# Patient Record
Sex: Male | Born: 1950
Health system: Southern US, Community
[De-identification: ages and names within clinical notes are randomized; demographics above are authoritative.]

## PROBLEM LIST (undated history)

## (undated) DIAGNOSIS — T7840XA Allergy, unspecified, initial encounter: Secondary | ICD-10-CM

## (undated) DIAGNOSIS — I421 Obstructive hypertrophic cardiomyopathy: Secondary | ICD-10-CM

## (undated) DIAGNOSIS — K5790 Diverticulosis of intestine, part unspecified, without perforation or abscess without bleeding: Secondary | ICD-10-CM

## (undated) DIAGNOSIS — K644 Residual hemorrhoidal skin tags: Secondary | ICD-10-CM

## (undated) DIAGNOSIS — J309 Allergic rhinitis, unspecified: Secondary | ICD-10-CM

## (undated) DIAGNOSIS — I10 Essential (primary) hypertension: Secondary | ICD-10-CM

## (undated) DIAGNOSIS — I517 Cardiomegaly: Secondary | ICD-10-CM

## (undated) DIAGNOSIS — K635 Polyp of colon: Secondary | ICD-10-CM

## (undated) DIAGNOSIS — I499 Cardiac arrhythmia, unspecified: Secondary | ICD-10-CM

## (undated) HISTORY — DX: Polyp of colon: K63.5

## (undated) HISTORY — DX: Allergic rhinitis, unspecified: J30.9

## (undated) HISTORY — DX: Cardiac arrhythmia, unspecified: I49.9

## (undated) HISTORY — DX: Obstructive hypertrophic cardiomyopathy: I42.1

## (undated) HISTORY — DX: Essential (primary) hypertension: I10

## (undated) HISTORY — DX: Residual hemorrhoidal skin tags: K64.4

## (undated) HISTORY — PX: TONSILLECTOMY: SHX5217

## (undated) HISTORY — DX: Allergy, unspecified, initial encounter: T78.40XA

## (undated) HISTORY — PX: COLONOSCOPY: SHX174

## (undated) HISTORY — DX: Cardiomegaly: I51.7

## (undated) HISTORY — DX: Diverticulosis of intestine, part unspecified, without perforation or abscess without bleeding: K57.90

---

## 2006-01-25 ENCOUNTER — Ambulatory Visit: Payer: Self-pay | Admitting: Internal Medicine

## 2006-01-25 LAB — CONVERTED CEMR LAB
AST: 16 units/L (ref 0–37)
BUN: 15 mg/dL (ref 6–23)
Calcium: 9.7 mg/dL (ref 8.4–10.5)
Chloride: 105 meq/L (ref 96–112)
Creatinine, Ser: 1.1 mg/dL (ref 0.4–1.5)
Glomerular Filtration Rate, Af Am: 90 mL/min/{1.73_m2}
Glucose, Bld: 85 mg/dL (ref 70–99)
HCT: 47.3 % (ref 39.0–52.0)
Potassium: 4.2 meq/L (ref 3.5–5.1)
RDW: 12.4 % (ref 11.5–14.6)
Total Bilirubin: 0.8 mg/dL (ref 0.3–1.2)
Total Protein: 6.8 g/dL (ref 6.0–8.3)
WBC: 7.9 10*3/uL (ref 4.5–10.5)

## 2006-01-27 ENCOUNTER — Ambulatory Visit: Payer: Self-pay | Admitting: Gastroenterology

## 2006-06-28 ENCOUNTER — Ambulatory Visit: Payer: Self-pay | Admitting: Internal Medicine

## 2006-07-06 ENCOUNTER — Encounter: Payer: Self-pay | Admitting: Internal Medicine

## 2006-07-06 ENCOUNTER — Ambulatory Visit: Payer: Self-pay

## 2006-07-11 ENCOUNTER — Emergency Department (HOSPITAL_COMMUNITY): Admission: EM | Admit: 2006-07-11 | Discharge: 2006-07-11 | Payer: Self-pay | Admitting: Emergency Medicine

## 2006-07-21 ENCOUNTER — Ambulatory Visit: Payer: Self-pay | Admitting: Cardiology

## 2006-07-21 ENCOUNTER — Encounter: Payer: Self-pay | Admitting: Internal Medicine

## 2006-08-02 ENCOUNTER — Ambulatory Visit: Payer: Self-pay

## 2006-08-02 ENCOUNTER — Encounter: Payer: Self-pay | Admitting: Internal Medicine

## 2006-08-12 ENCOUNTER — Telehealth: Payer: Self-pay | Admitting: Internal Medicine

## 2006-09-20 ENCOUNTER — Ambulatory Visit: Payer: Self-pay | Admitting: Cardiology

## 2006-11-29 ENCOUNTER — Encounter: Payer: Self-pay | Admitting: Internal Medicine

## 2006-12-07 ENCOUNTER — Encounter: Payer: Self-pay | Admitting: Internal Medicine

## 2006-12-20 ENCOUNTER — Telehealth (INDEPENDENT_AMBULATORY_CARE_PROVIDER_SITE_OTHER): Payer: Self-pay | Admitting: *Deleted

## 2006-12-24 ENCOUNTER — Ambulatory Visit: Payer: Self-pay | Admitting: Cardiology

## 2006-12-28 ENCOUNTER — Ambulatory Visit: Payer: Self-pay | Admitting: Cardiology

## 2006-12-28 ENCOUNTER — Encounter: Payer: Self-pay | Admitting: Internal Medicine

## 2007-05-05 ENCOUNTER — Encounter: Payer: Self-pay | Admitting: Internal Medicine

## 2007-08-19 ENCOUNTER — Ambulatory Visit: Payer: Self-pay | Admitting: Cardiology

## 2007-09-26 ENCOUNTER — Ambulatory Visit: Payer: Self-pay | Admitting: Cardiology

## 2007-09-26 ENCOUNTER — Encounter: Payer: Self-pay | Admitting: Cardiology

## 2008-03-09 ENCOUNTER — Ambulatory Visit: Payer: Self-pay | Admitting: Cardiology

## 2008-03-11 IMAGING — CT CT ABDOMEN WO/W CM
4 of 11 series · 14 of 46 positions shown, 20 images · IV contrast (Omnipaque 300)
Comparison: none

CLINICAL DATA: Family history of pancreatic CA. 
ABDOMEN CT WITHOUT AND WITH CONTRAST:
TECHNIQUE: Multidetector CT imaging of the abdomen was performed both before and during bolus administration of intravenous contrast.
Contrast:  125 cc Omnipaque 300.

[Series 2: non_contrast 5.0 b30f st · axial · 0.90mm/px · z∈[-196,-46]mm · 4 of 52 slices shown]
[im 11/52  soft-tissue]
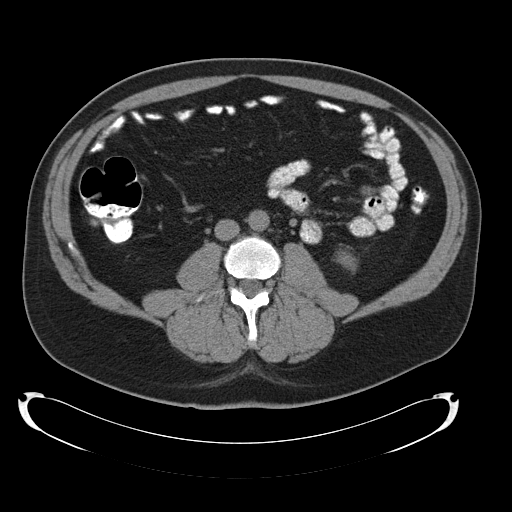
[im 21/52  soft-tissue]
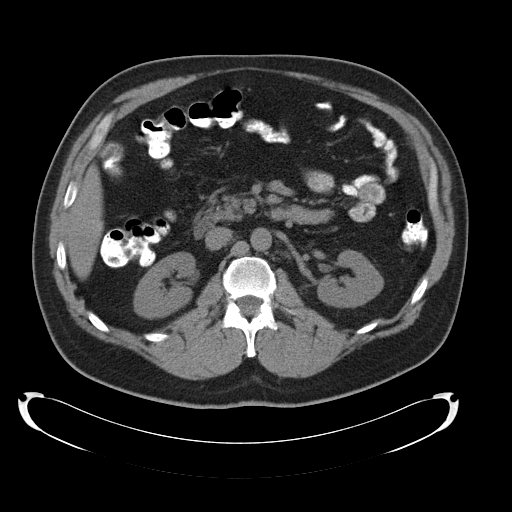
[im 31/52  soft-tissue]
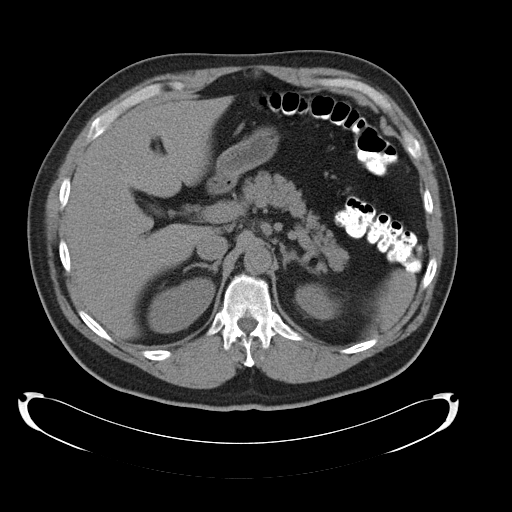
[im 41/52  soft-tissue]
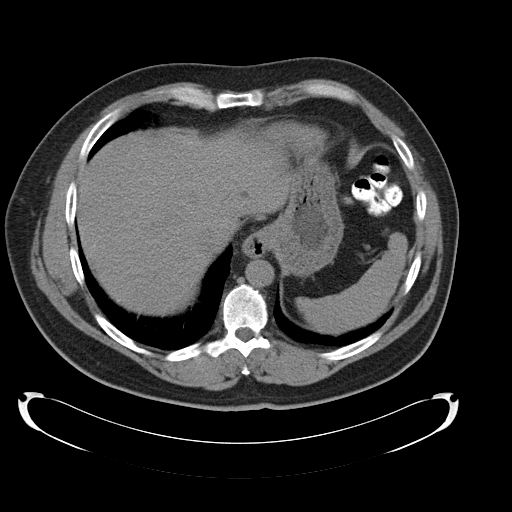

[Series 5: arterial 3.0 b30f st · axial · arterial · 0.91mm/px · z∈[-186,-160]mm · 2 of 55 slices shown]
[im 10/55  soft-tissue]
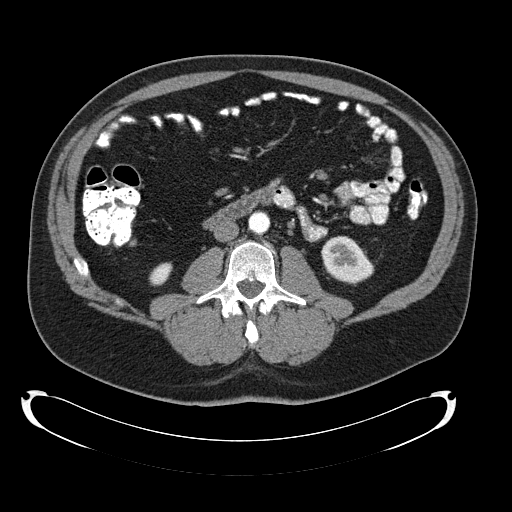
[im 19/55  soft-tissue]
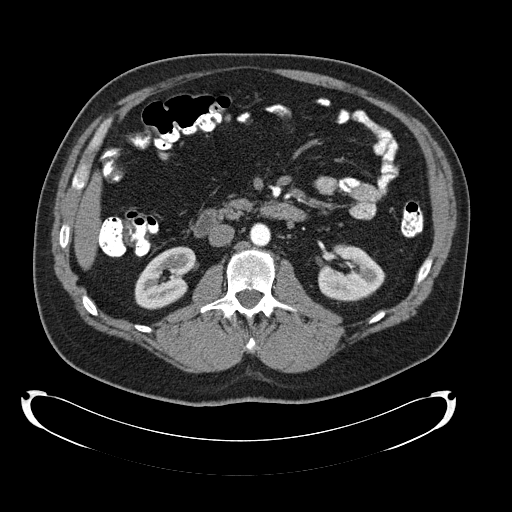

[Series 6: portal_venous 5.0 b30f · axial · 0.91mm/px · z∈[-210,-34]mm · 5 of 53 slices shown, 10 images]
[im 9/53  soft-tissue]
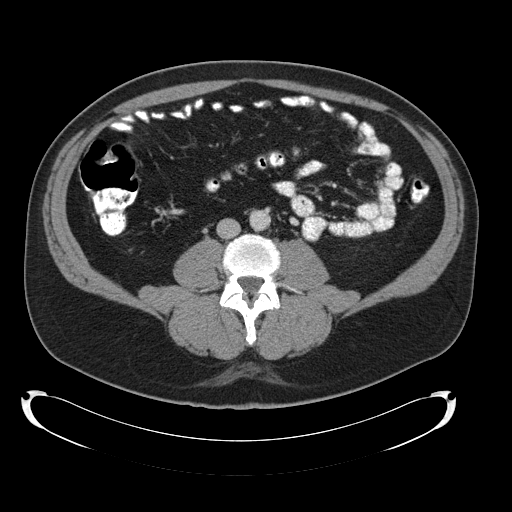
[im 9/53  bone]
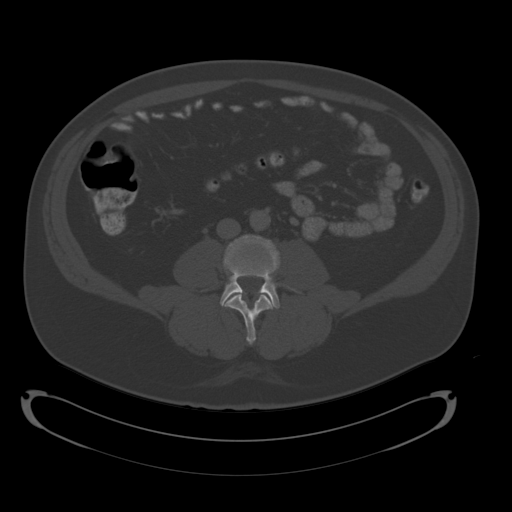
[im 18/53  soft-tissue]
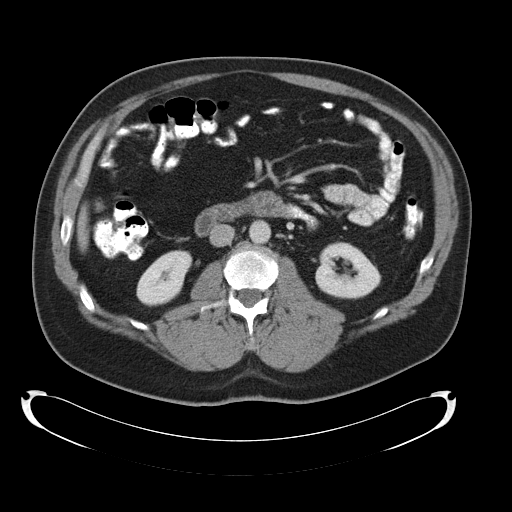
[im 18/53  lung]
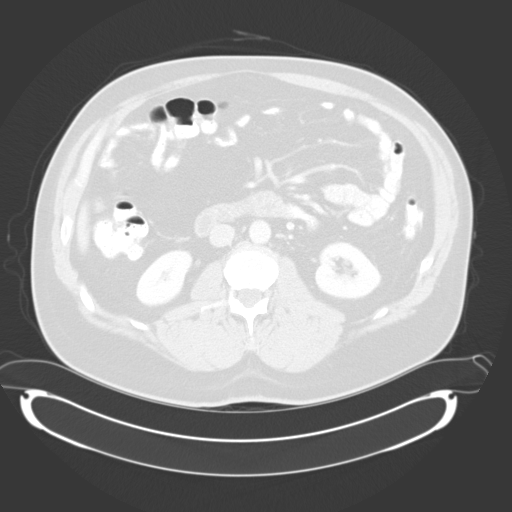
[im 27/53  soft-tissue]
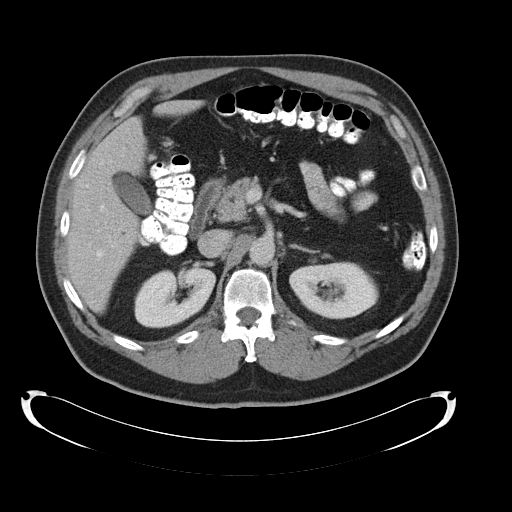
[im 27/53  lung]
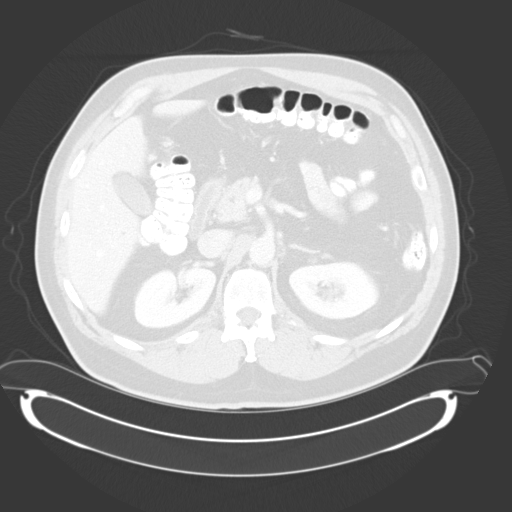
[im 35/53  soft-tissue]
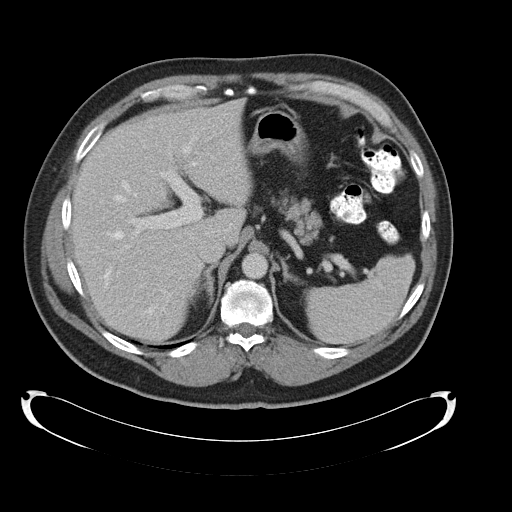
[im 35/53  lung]
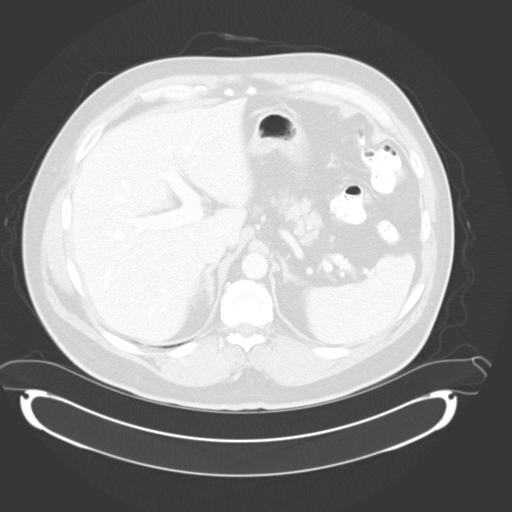
[im 44/53  soft-tissue]
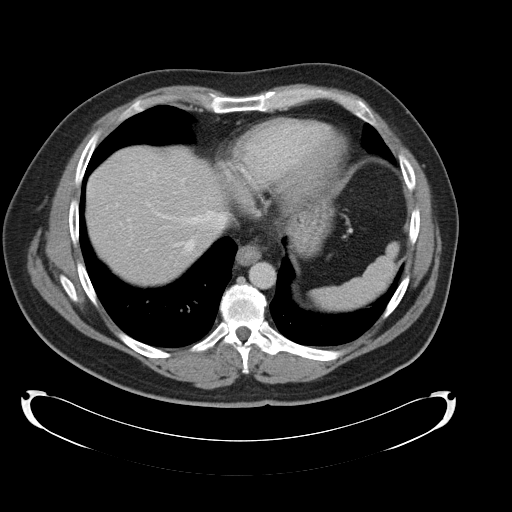
[im 44/53  lung]
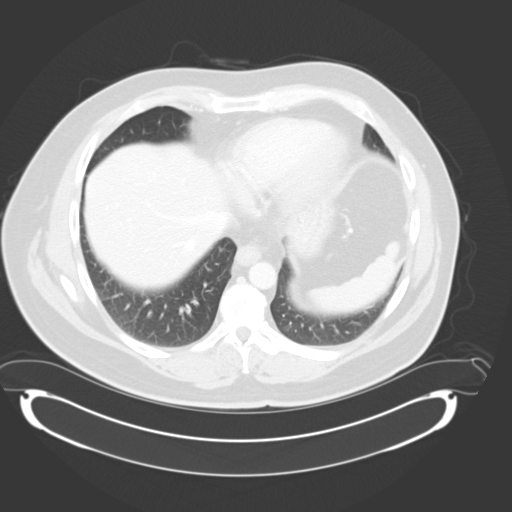

[Series 602: <mpr thick range> · coronal · 0.91mm/px · 3 of 94 slices shown, 4 images]
[im 24/94  soft-tissue]
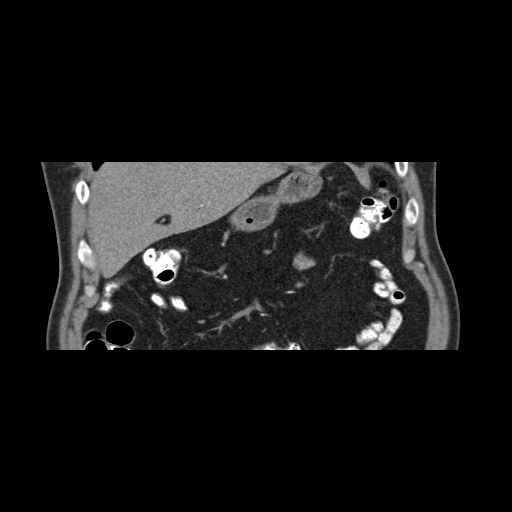
[im 47/94  soft-tissue]
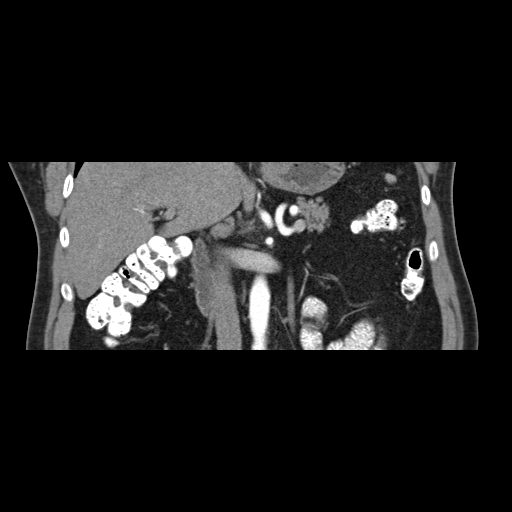
[im 47/94  bone]
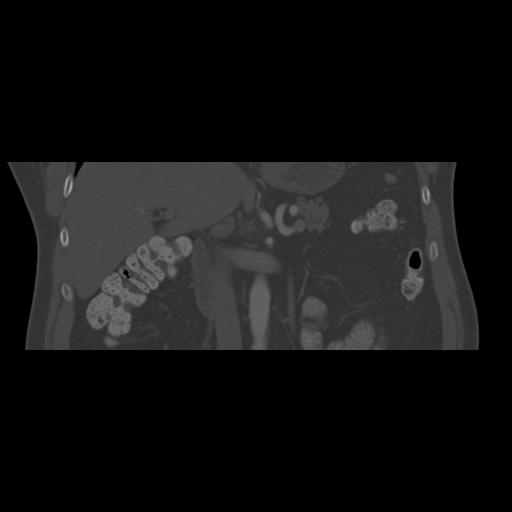
[im 70/94  soft-tissue]
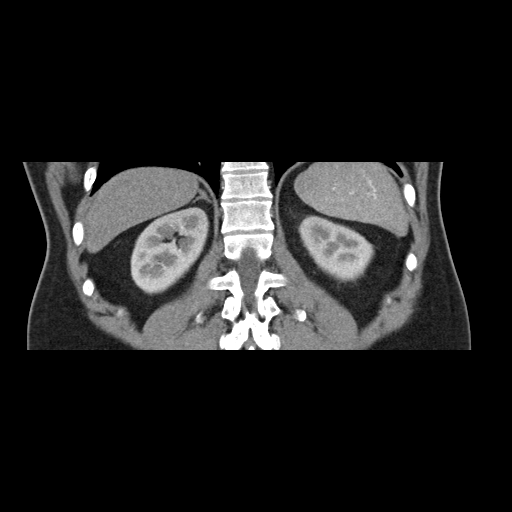

[14 of 46 positions shown; findings below may reference images not displayed]

FINDINGS: There is a very small cyst in the superior aspect of the left hepatic lobe. The pancreas is normal. No mass, abnormal contrast enhancement, or ductal dilatation. No peripancreatic abnormality. No adenopathy. Unremarkable spleen, kidneys, and adrenal glands.
IMPRESSION: Unremarkable CT scan of the abdomen with special attention to the pancreas.

## 2008-06-27 ENCOUNTER — Encounter (INDEPENDENT_AMBULATORY_CARE_PROVIDER_SITE_OTHER): Payer: Self-pay | Admitting: *Deleted

## 2008-09-22 DIAGNOSIS — R0602 Shortness of breath: Secondary | ICD-10-CM

## 2008-09-22 DIAGNOSIS — I517 Cardiomegaly: Secondary | ICD-10-CM | POA: Insufficient documentation

## 2008-09-22 DIAGNOSIS — I10 Essential (primary) hypertension: Secondary | ICD-10-CM

## 2008-09-22 HISTORY — DX: Essential (primary) hypertension: I10

## 2008-09-22 HISTORY — DX: Cardiomegaly: I51.7

## 2008-09-24 ENCOUNTER — Ambulatory Visit: Payer: Self-pay | Admitting: Cardiology

## 2008-09-24 DIAGNOSIS — I421 Obstructive hypertrophic cardiomyopathy: Secondary | ICD-10-CM

## 2008-09-24 HISTORY — DX: Obstructive hypertrophic cardiomyopathy: I42.1

## 2008-10-09 ENCOUNTER — Telehealth: Payer: Self-pay | Admitting: Cardiology

## 2008-12-24 ENCOUNTER — Ambulatory Visit: Payer: Self-pay | Admitting: Internal Medicine

## 2008-12-24 DIAGNOSIS — J309 Allergic rhinitis, unspecified: Secondary | ICD-10-CM | POA: Insufficient documentation

## 2008-12-24 HISTORY — DX: Allergic rhinitis, unspecified: J30.9

## 2009-01-17 ENCOUNTER — Ambulatory Visit: Payer: Self-pay | Admitting: Internal Medicine

## 2009-01-17 LAB — CONVERTED CEMR LAB
Eosinophils Relative: 2.2 % (ref 0.0–5.0)
HCT: 46.3 % (ref 39.0–52.0)
Lymphs Abs: 2.9 10*3/uL (ref 0.7–4.0)
MCHC: 32.6 g/dL (ref 30.0–36.0)
MCV: 89.5 fL (ref 78.0–100.0)
Monocytes Relative: 9.8 % (ref 3.0–12.0)
RBC: 5.18 M/uL (ref 4.22–5.81)
RDW: 12.4 % (ref 11.5–14.6)

## 2009-01-21 ENCOUNTER — Encounter: Payer: Self-pay | Admitting: Internal Medicine

## 2009-01-23 ENCOUNTER — Telehealth (INDEPENDENT_AMBULATORY_CARE_PROVIDER_SITE_OTHER): Payer: Self-pay | Admitting: *Deleted

## 2009-01-30 ENCOUNTER — Telehealth (INDEPENDENT_AMBULATORY_CARE_PROVIDER_SITE_OTHER): Payer: Self-pay | Admitting: *Deleted

## 2009-02-01 LAB — CONVERTED CEMR LAB: IgE (Immunoglobulin E), Serum: <1.5 intl units/mL (ref 0.0–180.0)

## 2009-03-20 ENCOUNTER — Ambulatory Visit: Payer: Self-pay | Admitting: Internal Medicine

## 2009-03-27 ENCOUNTER — Ambulatory Visit: Payer: Self-pay | Admitting: Cardiology

## 2009-04-10 ENCOUNTER — Encounter: Payer: Self-pay | Admitting: Cardiology

## 2009-04-10 ENCOUNTER — Ambulatory Visit: Payer: Self-pay

## 2009-04-10 ENCOUNTER — Ambulatory Visit: Payer: Self-pay | Admitting: Internal Medicine

## 2009-04-10 ENCOUNTER — Ambulatory Visit (HOSPITAL_COMMUNITY): Admission: RE | Admit: 2009-04-10 | Discharge: 2009-04-10 | Payer: Self-pay | Admitting: Cardiology

## 2009-04-19 ENCOUNTER — Ambulatory Visit: Payer: Self-pay | Admitting: Cardiology

## 2009-04-30 ENCOUNTER — Encounter: Payer: Self-pay | Admitting: Cardiology

## 2009-04-30 ENCOUNTER — Telehealth (INDEPENDENT_AMBULATORY_CARE_PROVIDER_SITE_OTHER): Payer: Self-pay | Admitting: *Deleted

## 2009-06-04 ENCOUNTER — Ambulatory Visit: Payer: Self-pay

## 2009-06-04 ENCOUNTER — Ambulatory Visit: Payer: Self-pay | Admitting: Cardiology

## 2010-02-11 NOTE — Assessment & Plan Note (Signed)
Summary: George [name]   Copy to:  pcp Primary Provider/Referring Provider:  Dr. Eleonore Chiquito  CC:  follow up visit.  History of Present Illness: January 17, 2009- 60 yoM seen courtesy of Dr Frederica Kuster for concern of severe nasal congestion.  He has had episodic rhinitis as an adult and reports allergy testing years ago which he said produced delayed reactions next day. He was told he was allergic to mold, mildew and pet danders. His last significant flare was in 2008. 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 Since he does get better at times, he suspects something related to the weather causing the HVAC system to operate. He reports that other workers in the building have similar problems. he gets better as he drives away from the building  He gets nasal blockage such that he has to sleep upright in a chair. Sudafed-PE and Zyrtec give only temporary help. Saline squeeze bottle helps. Amoxacillin for a root canal helped once before, but he denies fever, purulent discharge, headache, cough or wheeze. He lives with 2 cats on a long term basis and denies they are a problem. ENT surgery limitied to tonsils.  March 27, 2009-  Rhinitis Neb and depomedrol helped a lot for several days after last here. 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 Today he is doing better than in February, but he still notices stuffiness with little nasal discharge. He put an airfilter in his work area. He refers again to "year around allergy to mold and mildew".   Current Medications (verified): 1)  Flonase 50 Mcg/act  Susp (Fluticasone Propionate) .... I Spray Ea Nostril Once Daily 2)  Metoprolol Succinate 50 Mg Xr24h-Tab (Metoprolol Succinate) .... Take One Tablet By Mouth Daily 3)  Zyrtec Allergy 10 Mg Tabs  (Cetirizine Hcl) .... As Needed 4)  Multivitamins   Tabs (Multiple Vitamin) .Marland Kitchen.. 1 Tab By Mouth Once Daily 5)  Omega 3 340 Mg Cpdr (Omega-3 Fatty Acids) .Marland Kitchen.. 1 Tab By Mouth Once Daily 6)  Singulair 10 Mg Tabs (Montelukast Sodium) .... Take 1 By Mouth Once Daily 7)  Resveratrol 100 Mg Caps (Resveratrol) .... Take 340mg  By Mouth Once Daily  Allergies (verified): 1)  ! Sulfa  Comments:  Nurse/Medical Assistant: The patient's medications and allergies were reviewed with the patient and were updated in the Medication and Allergy Lists. Boone Master CNA  March 20, 2009 9:47 AM   Past History:  Past Medical History: Last updated: 01/17/2009 DYSPNEA (ICD-786.05) HYPERTENSION (ICD-401.9) VENTRICULAR HYPERTROPHY, LEFT (ICD-429.3)    [facility] FAMILY HISTORY OF OTHER MALIGNANT NEOPLASM (ICD-V16.49) perennial rhinitis  Past Surgical History: Last updated: 09/24/2008 Tonsillectomy  Family History: Last updated: 01/17/2009  Is negative for coronary artery disease or sudden   death. There is no history of cardiomyopathy by his report.   Pancreatic Cancer-father  Social History: Last updated: 01/17/2009   He has a remote history of tobacco use, but has not   smoked since the 1980s. He occasionally consumes a glass of wine.  Grew up in [place]   Marital Status: Married, lives with wife Children: no Occupation: Building surveyor, Research scientist (medical) for [employer]  Review of Systems      See HPI  The patient denies anorexia, fever, weight loss, weight gain, vision loss,  decreased hearing, hoarseness, chest pain, syncope, dyspnea on exertion, peripheral edema, prolonged cough, headaches, hemoptysis, abdominal pain, and severe indigestion/heartburn.    Vital Signs:  Patient profile:   60 year old male Height:      70 inches Weight:      237 pounds BMI:     34.13 O2 Sat:      96 % on Room air Pulse rate:   65 / minute BP sitting:   118 / 76  (left arm) Cuff size:   regular  Vitals  Entered By: Boone Master CNA (March 20, 2009 9:44 AM)  O2 Flow:  Room air CC: follow up visit   Physical Exam  Additional Exam:  General: A/Ox3; pleasant and cooperative, NAD, SKIN: no rash, lesions NODES: no lymphadenopathy HEENT: [place]/AT, EOM- WNL, Conjuctivae- clear, PERRLA, mild periorbital edema, TM-WNL, Nose- nasal mucosa grainy/ red with white mucus, no polyps, Throat- clear and wnl, Mellampatti  III NECK: Supple w/ fair ROM, JVD- none, normal carotid impulses w/o bruits Thyroid- normal to palpation CHEST: Clear to P&A HEART: RRR, 2-3/6 systolic murmur left upper sternum ABDOMEN: Soft and nl; nml bowel sounds;  ZOX:WRUE, nl pulses, no edema  NEURO: Grossly intact to observation      Impression & Recommendations:  Problem # 1:  VASOMOTOR RHINITIS (ICD-477.9)  I question how much of his problem is an irritant response vs allergic. Drafts and probably mold exposure are important by his history. He is considering leaving this job where the environment is significant. Question IgG response. Since he thinks singulair is helpful, he will continue that for now. We discussed and will also script ipratropium nasal spray for trial. nasalcrom is an option. His updated medication list for this problem includes:    Flonase 50 Mcg/act Susp (Fluticasone propionate) ..... I spray ea nostril once daily    Zyrtec Allergy 10 Mg Tabs (Cetirizine hcl) .Marland Kitchen... As needed  Orders: Est. Patient Level III (45409)  Medications Added to Medication List This Visit: 1)  Resveratrol 100 Mg Caps (Resveratrol) .... Take 340mg  by mouth once daily  Patient Instructions: 1)  Please schedule a follow-up appointment as needed. 2)  Singulair can be continued daily long term if it helps. 3)  OTC Nasalcrom/ cromolyn nasal spray is a different kind of anti-inflammatory chemistry and might also help.

## 2010-02-11 NOTE — Progress Notes (Signed)
Summary: results  Phone Note Call from Patient Call back at [phone]   Caller: Patient Call For: young Summary of Call: calling for lab results Initial call taken by: Rickard Patience,  January 30, 2009 4:47 PM  Follow-up for Phone Call        Labs signed, but no interpretation. I have printed them and placed on CY look-at along with phone note. Pt aware will not be addresed until the AM. Please advise. Carron Curie CMA  January 30, 2009 5:06 PM   Additional Follow-up for Phone Call Additional follow up Details #1::        Ready. Additional Follow-up by: Waymon Budge MD,  January 30, 2009 9:37 PM    Additional Follow-up for Phone Call Additional follow up Details #2::    [email or web address].Reynaldo Minium CMA  February 01, 2009 12:37 PM

## 2010-02-11 NOTE — Progress Notes (Signed)
Summary: appt  ---- Converted from flag ---- ---- 01/18/2009 1:53 PM, Zackery Barefoot CMA wrote: please call and schedule pt one month out from yesterday with Dr. Maple Hudson. everyone was gone by the time he was done.... ------------------------------  Phone Note Call from Patient   Summary of Call: pt schedulled for 02/26/2009 to see CDY Initial call taken by: Eugene Gavia,  January 30, 2009 4:51 PM

## 2010-02-11 NOTE — Assessment & Plan Note (Signed)
Summary: f31m/dm  Medications Added METOPROLOL SUCCINATE 50 MG XR24H-TAB (METOPROLOL SUCCINATE) Take one tablet by mouth daily        Referring Provider:  pcp Primary Provider:  Dr. Eleonore Chiquito   History of Present Illness: Mr. Latorre is a pleasant gentleman who has a history of left ventricular hypertrophy with systolic anterior motion of the mitral valve and Doppler evidence of a dynamic left outflow obstruction with Valsalva.  Last echocardiogram was performed on September 26, 2007. There was normal LV function, chordal SAM causing a narrow LVOT and mild mitral regurgitation. There is mild left atrial enlargement. A previous Myoview in July 2008 showed no ischemia or infarction.  Also note that he exercised for 7 minutes and 16 seconds, and there was normal blood pressure response. I last saw him in Sept 2010. Since then the patient has dyspnea with more extreme activities but  not with routine activities. It is relieved with rest. It is not associated with chest pain. There is no orthopnea, PND. There is no syncope or palpitations. There is no exertional chest pain. There is occasional mild pedal edema towards the end of the day it improves overnight.   Current Medications (verified): 1)  Flonase 50 Mcg/act  Susp (Fluticasone Propionate) .... I Spray Ea Nostril Once Daily 2)  Metoprolol Succinate 50 Mg Xr24h-Tab (Metoprolol Succinate) .... Take One Tablet By Mouth Daily 3)  Zyrtec Allergy 10 Mg Tabs (Cetirizine Hcl) .... As Needed 4)  Multivitamins   Tabs (Multiple Vitamin) .Marland Kitchen.. 1 Tab By Mouth Once Daily 5)  Omega 3 340 Mg Cpdr (Omega-3 Fatty Acids) .Marland Kitchen.. 1 Tab By Mouth Once Daily 6)  Singulair 10 Mg Tabs (Montelukast Sodium) .... Take 1 By Mouth Once Daily 7)  Resveratrol 100 Mg Caps (Resveratrol) .... Take 340mg  By Mouth Once Daily  Allergies: 1)  ! Sulfa  Past History:  Past Medical History: Reviewed history from 01/17/2009 and no changes required. DYSPNEA  (ICD-786.05) HYPERTENSION (ICD-401.9) VENTRICULAR HYPERTROPHY, LEFT (ICD-429.3)    [facility] FAMILY HISTORY OF OTHER MALIGNANT NEOPLASM (ICD-V16.49) perennial rhinitis  Past Surgical History: Reviewed history from 09/24/2008 and no changes required. Tonsillectomy  Social History: Reviewed history from 01/17/2009 and no changes required.   He has a remote history of tobacco use, but has not   smoked since the 1980s. He occasionally consumes a glass of wine.  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 Remaining systems are negative.   Vital Signs:  Patient profile:   60 year old male Height:      70 inches Weight:      231 pounds BMI:     33.26 Pulse rate:   51 / minute Resp:     12 per minute BP sitting:   115 / 70  (left arm)  Vitals Entered By: Kem Parkinson (March 27, 2009 8:24 AM)  Physical Exam  General:  Well-developed well-nourished in no acute distress.  Skin is warm and dry.  HEENT is normal.  Neck is supple. No thyromegaly.  Chest is clear to auscultation with normal expansion.  Cardiovascular exam is regular rate and rhythm. 2/6 systolic murmur at the left sternal border that increases with Valsalva. Abdominal exam nontender or distended. No masses  palpated. Extremities show no edema. neuro grossly intact    EKG  Procedure date:  03/27/2009  Findings:      Sinus bradycardia at a rate of 51. Cannot rule out prior septal infarct.  Impression & Recommendations:  Problem # 1:  CARDIOMYOPATHY, HYPERTROPHIC, OBSTRUCTIVE (ICD-425.1) Patient has a history of systolic anterior motion of the mitral valve with narrowed left ventricular outflow  tract. He has hypertrophic obstructive physiology. I will continue with his beta blocker. He has had significant increase in symptoms off of this previously. He did not tolerate Cardizem or verapamil. Repeat echocardiogram. His updated medication list for this problem includes:    Metoprolol Succinate 50 Mg Xr24h-tab (Metoprolol succinate) .Marland Kitchen... Take one tablet by mouth daily  Problem # 2:  HYPERTENSION (ICD-401.9) Blood pressure controlled on present medications. Will continue. His updated medication list for this problem includes:    Metoprolol Succinate 50 Mg Xr24h-tab (Metoprolol succinate) .Marland Kitchen... Take one tablet by mouth daily  Problem # 3:  DYSPNEA (ICD-786.05) Most likely related to hypertrophic obstructive physiology. His updated medication list for this problem includes:    Metoprolol Succinate 50 Mg Xr24h-tab (Metoprolol succinate) .Marland Kitchen... Take one tablet by mouth daily  Other Orders: Echocardiogram (Echo)  Patient Instructions: 1)  Your physician recommends that you schedule a follow-up appointment in: ONE YEAR 2)  Your physician has requested that you have an echocardiogram.  Echocardiography is a painless test that uses sound waves to create images of your heart. It provides your doctor with information about the size and shape of your heart and how well your heart's chambers and valves are working.  This procedure takes approximately one hour. There are no restrictions for this procedure. Prescriptions: METOPROLOL SUCCINATE 50 MG XR24H-TAB (METOPROLOL SUCCINATE) Take one tablet by mouth daily  #30 x 12   Entered by:   Deliah Goody, RN   Authorized by:   Ferman Hamming, MD, [facility]   Signed by:   Deliah Goody, RN on 03/27/2009   Method used:   Electronically to        [facility] (retail)       [address]       [place], [place]  [address]       Ph: [phone]       Fax: [phone]   RxID:   [id number]

## 2010-02-11 NOTE — Progress Notes (Signed)
   Phone Note Outgoing Call   Call placed by: Deliah Goody, RN,  April 30, 2009 12:13 PM Summary of Call: pt made aware that holter monitor reviewed by dr [name] shows sinus with rare PVC,no NSVT. Deliah Goody, RN  April 30, 2009 12:14 PM

## 2010-02-11 NOTE — Assessment & Plan Note (Signed)
Summary: allergy problem/ mbw   Vital Signs:  Patient profile:   60 year old male Height:      70 inches Weight:      242 pounds O2 Sat:      97 % on Room air Pulse rate:   69 / minute BP sitting:   140 / 82  (right arm) Cuff size:   large  Vitals Entered By: Zackery Barefoot CMA (January 17, 2009 4:40 PM)  O2 Flow:  Room air CC: Pt here for allergy consult. Pt c/o re-current sinus infections Comments Medications reviewed with patient Zackery Barefoot [facility]  January 17, 2009 4:42 PM    Visit Type:  Initial Consult Copy to:  pcp Primary Provider/Referring Provider:  Dr. Eleonore Chiquito  CC:  Pt here for allergy consult. Pt c/o re-current sinus infections.  History of Present Illness: January 17, 2009- 59 yoM seen courtesy of Dr Frederica Kuster for concern of severe nasal congestion.  He has had episodic rhinitis as an adult and reports allergy testing years ago which he said produced delayed reactions next day. He was told he was allergic to mold, mildew and pet danders. His last significant flare was in 2008. 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 Since he does get better at times, he suspects something related to the weather causing the HVAC system to operate. He reports that other workers in the building have similar problems. he gets better as he drives away from the building  He gets nasal blockage such that he has to sleep upright in a chair. Sudafed-PE and Zyrtec give only temporary help. Saline squeeze bottle helps. Amoxacillin for a root canal helped once before, but he denies fever, purulent discharge, headache, cough or wheeze. He lives with 2 cats on a long term basis and denies they are a problem. ENT surgery limitied to tonsils.  Current Medications (verified): 1)  Flonase 50 Mcg/act  Susp (Fluticasone Propionate) .... I Spray Ea Nostril Once Daily 2)  Metoprolol Succinate  50 Mg Xr24h-Tab (Metoprolol Succinate) .... Take One Tablet By Mouth Daily 3)  Zyrtec Allergy 10 Mg Tabs (Cetirizine Hcl) .... As Needed 4)  Multivitamins   Tabs (Multiple Vitamin) .Marland Kitchen.. 1 Tab By Mouth Once Daily 5)  Omega 3 340 Mg Cpdr (Omega-3 Fatty Acids) .Marland Kitchen.. 1 Tab By Mouth Once Daily  Allergies (verified): 1)  ! Sulfa  Past History:  Past Surgical History: Last updated: 09/24/2008 Tonsillectomy  Family History: Last updated: 01/17/2009  Is negative for coronary artery disease or sudden   death. There is no history of cardiomyopathy by his report.   Pancreatic Cancer-father  Social History: Last updated: 01/17/2009   He has a remote history of tobacco use, but has not   smoked since the 1980s. He occasionally consumes a glass of wine.  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 There is no history of cardiomyopathy by his report.   Pancreatic Cancer-father  Social History:   He has a remote history of tobacco use, but has not   smoked since the  1980s. He occasionally consumes a glass of wine.  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  The patient denies shortness of breath with activity, shortness of breath at rest, productive cough, non-productive cough, coughing up blood, chest pain, irregular heartbeats, acid heartburn, indigestion, loss of appetite, weight change,  abdominal pain, difficulty swallowing, sore throat, tooth/dental problems, headaches, sneezing, itching, ear ache, anxiety, depression, joint stiffness or pain, rash, change in color of mucus, and fever.    Physical Exam  Additional Exam:  General: A/Ox3; pleasant and cooperative, NAD, SKIN: no rash, lesions NODES: no lymphadenopathy HEENT: [place]/AT, EOM- WNL, Conjuctivae- clear, PERRLA, mild periorbital edema, TM-WNL, Nose- nasal mucosa grainy/ red with white mucus, no polyps, Throat- clear and wnl, Mellampatti  III NECK: Supple w/ fair ROM, JVD- none, normal carotid impulses w/o bruits Thyroid- normal to palpation CHEST: Clear to P&A HEART: RRR, 2-3/6 systolic murmur left upper sternum ABDOMEN: Soft and nl; nml bowel sounds;  GUY:QIHK, nl pulses, no edema  NEURO: Grossly intact to observation      Impression & Recommendations:  Problem # 1:  VASOMOTOR RHINITIS (ICD-477.9)  Significant rhinitis- episodic. We need to see if we can identify an atopic component. He is anxious to get enough immediate control that he can get some sleep at night. We are giving nasl neb and depo. His updated medication list for this problem includes:    Flonase 50 Mcg/act Susp (Fluticasone propionate) ..... I spray ea nostril once daily    Zyrtec Allergy 10 Mg Tabs (Cetirizine hcl) .Marland Kitchen... As needed  Other Orders: Consultation Level III (74259) TLB-CBC Platelet - w/Differential (85025-CBCD) T-"RAST" (Allergy Full Profile) IGE (56387-56433) EMR miscellaneous medications (EMRORAL) Nebulizer Tx (29518) Depo- Medrol 80mg  (J1040) Admin of Therapeutic Inj  intramuscular or subcutaneous (84166)  Patient Instructions: 1)  Please schedule a follow-up appointment in 1 month. 2)  Consider environmental info/ air cleaner 3)  Try Singulair 10 mg, 1 daily, samples and script 4)  neb neo nasal 5)  depo 80 6)  lab   Not Administered:    Influenza Vaccine not given due to: declined    Medication  Administration  Injection # 1:    Medication: Depo- Medrol 80mg     Diagnosis: VASOMOTOR RHINITIS (ICD-477.9)    Route: IM    Site: RUOQ gluteus    Exp Date: 10/11    Lot #: [id number]    Mfr: [email or web address]    Patient tolerated injection without complications    Given by: Zackery Barefoot CMA (January 17, 2009 5:52 PM)  Medication # 1:    Medication: EMR miscellaneous medications    Diagnosis: VASOMOTOR RHINITIS (ICD-477.9)    Dose: 3 sprays    Route: intranasal    Exp Date: 05/12    Lot #: [id number]    Mfr: [email or web address]    Comments: Neo-Synephrine    Patient tolerated medication without complications    Given by: Zackery Barefoot CMA (January 17, 2009 5:51 PM)  Orders Added: 1)  Consultation Level III [73220] 2)  TLB-CBC Platelet - w/Differential [85025-CBCD] 3)  T-"RAST" (Allergy Full Profile) IGE [86003-82785] 4)  EMR miscellaneous medications [EMRORAL] 5)  Nebulizer Tx [25427] 6)  Depo- Medrol 80mg  [J1040] 7)  Admin of Therapeutic Inj  intramuscular  or subcutaneous [id number]

## 2010-02-11 NOTE — Procedures (Signed)
Summary: sumary report  sumary report   Imported By: Mirna Mires 04/30/2009 15:07:42  _____________________________________________________________________  External Attachment:    Type:   Image     Comment:   External Document

## 2010-03-13 ENCOUNTER — Ambulatory Visit: Payer: Self-pay | Admitting: Cardiology

## 2010-03-13 ENCOUNTER — Encounter: Payer: Self-pay | Admitting: Cardiology

## 2010-03-13 ENCOUNTER — Ambulatory Visit (INDEPENDENT_AMBULATORY_CARE_PROVIDER_SITE_OTHER): Payer: BC Managed Care – PPO | Admitting: Cardiology

## 2010-03-13 DIAGNOSIS — I421 Obstructive hypertrophic cardiomyopathy: Secondary | ICD-10-CM

## 2010-03-13 DIAGNOSIS — I10 Essential (primary) hypertension: Secondary | ICD-10-CM

## 2010-03-20 NOTE — Assessment & Plan Note (Signed)
Summary: f1y per check [email or web address]  Medications Added ZYRTEC ALLERGY 10 MG TABS (CETIRIZINE HCL) 1 tab by mouth once daily SINGULAIR 10 MG TABS (MONTELUKAST SODIUM) as needed        Referring Provider:  pcp Primary Provider:  Dr. Eleonore Chiquito  CC:  sob.  History of Present Illness: Mr. Bisping is a pleasant gentleman who has a history of left ventricular hypertrophy with systolic anterior motion of the mitral valve and Doppler evidence of a dynamic left outflow obstruction with Valsalva.  Last echocardiogram was performed in March of 2011. There was normal LV function, chordal SAM causing a narrow LVOT and elevated LVOT gradient at rest with a mean gradient of 71 mmHg. Holter monitor in April of 2011 showed no nonsustained tachycardia. A previous Myoview in July 2008 showed no ischemia or infarction.  Also note that he exercised for 7 minutes and 16 seconds, and there was normal blood pressure response. I last saw him in March of 2011. Since then he does have some dyspnea on exertion but no orthopnea, PND, palpitations, syncope or chest pain. He occasionally has mild pedal edema.   Current Medications (verified): 1)  Flonase 50 Mcg/act  Susp (Fluticasone Propionate) .... I Spray Ea Nostril Once Daily 2)  Metoprolol Succinate 50 Mg Xr24h-Tab (Metoprolol Succinate) .... Take One Tablet By Mouth Daily 3)  Zyrtec Allergy 10 Mg Tabs (Cetirizine Hcl) .Marland Kitchen.. 1 Tab By Mouth Once Daily 4)  Multivitamins   Tabs (Multiple Vitamin) .Marland Kitchen.. 1 Tab By Mouth Once Daily 5)  Omega 3 340 Mg Cpdr (Omega-3 Fatty Acids) .Marland Kitchen.. 1 Tab By Mouth Once Daily 6)  Singulair 10 Mg Tabs (Montelukast Sodium) .... As Needed 7)  Resveratrol 100 Mg Caps (Resveratrol) .... Take 340mg  By Mouth Once Daily  Allergies: 1)  ! Sulfa  Past History:  Past Medical History: HYPERTENSION (ICD-401.9) VENTRICULAR HYPERTROPHY, LEFT (ICD-429.3)    [facility] FAMILY HISTORY OF OTHER MALIGNANT NEOPLASM (ICD-V16.49) perennial  rhinitis  Past Surgical History: Reviewed history from 09/24/2008 and no changes required. Tonsillectomy  Social History: Reviewed history from 01/17/2009 and no changes required.   He has a remote history of tobacco use, but has not   smoked since the 1980s. He occasionally consumes a glass of wine.  Grew up in [place]   Marital Status: Married, lives with wife Children: no Occupation: Building surveyor, Research scientist (medical) for [employer]  Review of Systems       no fevers or chills, productive cough, hemoptysis, dysphasia, odynophagia, melena, hematochezia, dysuria, hematuria, rash, seizure activity, orthopnea, PND, pedal edema, claudication. Remaining systems are negative.   Vital Signs:  Patient profile:   60 year old male Height:      70 inches Weight:      238 pounds BMI:     34.27 Pulse rate:   54 / minute Resp:     14 per minute BP sitting:   136 / 78  (left arm)  Vitals Entered By: Kem Parkinson (March 13, 2010 8:52 AM)  Physical Exam  General:  Well-developed well-nourished in no acute distress.  Skin is warm and dry.  HEENT is normal.  Neck is supple. No thyromegaly.  Chest is clear to auscultation with normal expansion.  Cardiovascular exam is regular rate and rhythm. 3/6 systolic murmur left sternal border that increases with Valsalva. Abdominal exam nontender or distended. No masses palpated. Extremities show no edema. neuro grossly intact    EKG  Procedure date:  03/13/2010  Findings:  Sinus bradycardia, left ventricular hypertrophy.  Impression & Recommendations:  Problem # 1:  CARDIOMYOPATHY, HYPERTROPHIC, OBSTRUCTIVE (ICD-425.1) Continue beta blocker. If his symptoms worsen in the future we will consider referral to [facility]. Consideration for myectomy or other procedures. He has no risk factors for sudden death including no family history of sudden death, no syncope, no nonsustained ventricular tachycardia on Holter. Repeat  exercise treadmill to make sure systolic blood pressure increases. His updated medication list for this problem includes:    Metoprolol Succinate 50 Mg Xr24h-tab (Metoprolol succinate) .Marland Kitchen... Take one tablet by mouth daily  Problem # 2:  HYPERTENSION (ICD-401.9)  Blood pressure controlled. Continue present medications. His updated medication list for this problem includes:    Metoprolol Succinate 50 Mg Xr24h-tab (Metoprolol succinate) .Marland Kitchen... Take one tablet by mouth daily  His updated medication list for this problem includes:    Metoprolol Succinate 50 Mg Xr24h-tab (Metoprolol succinate) .Marland Kitchen... Take one tablet by mouth daily  Other Orders: Treadmill (Treadmill)  Patient Instructions: 1)  Your physician wants you to follow-up in: one year  You will receive a reminder letter in the mail two months in advance. If you don't receive a letter, please call our office to schedule the follow-up appointment. 2)  Your physician has requested that you have an exercise tolerance test.  For further information please visit [email or web address]  Please also follow instruction sheet, as given.

## 2010-03-25 ENCOUNTER — Encounter: Payer: Self-pay | Admitting: Physician Assistant

## 2010-03-25 ENCOUNTER — Encounter (INDEPENDENT_AMBULATORY_CARE_PROVIDER_SITE_OTHER): Payer: BC Managed Care – PPO | Admitting: Physician Assistant

## 2010-03-25 DIAGNOSIS — I421 Obstructive hypertrophic cardiomyopathy: Secondary | ICD-10-CM

## 2010-04-07 ENCOUNTER — Other Ambulatory Visit: Payer: Self-pay | Admitting: Cardiology

## 2010-04-09 ENCOUNTER — Telehealth: Payer: Self-pay | Admitting: Cardiology

## 2010-04-09 DIAGNOSIS — I10 Essential (primary) hypertension: Secondary | ICD-10-CM

## 2010-04-09 MED ORDER — METOPROLOL SUCCINATE ER 50 MG PO TB24: 50.0000 mg | ORAL_TABLET | Freq: Every day | ORAL | Status: DC

## 2010-04-09 NOTE — Telephone Encounter (Signed)
Refill done.  

## 2010-04-10 NOTE — Telephone Encounter (Signed)
[address] °

## 2010-04-17 ENCOUNTER — Other Ambulatory Visit: Payer: Self-pay | Admitting: Internal Medicine

## 2010-05-08 ENCOUNTER — Other Ambulatory Visit: Payer: Self-pay | Admitting: Internal Medicine

## 2010-05-27 NOTE — Assessment & Plan Note (Signed)
[facility] HEALTHCARE                            CARDIOLOGY OFFICE NOTE   NAME:George [name], George [name]                        MRN:          [id number]  DATE:12/24/2006                            DOB:          11-05-1950    George [name] is a 60 year old gentleman who has a history of left  ventricular hypertrophy, systolic anterior motion of the mitral valve  and Doppler evidence of a dynamic left ventricular outflow obstruction  on Valsalva.  A previous Myoview showed no ischemia or infarction with  preserved LV function.  Since when I last saw him we discontinued his  Toprol as it was causing some fatigue and placed him on Verapamil,  however, this caused significant constipation and he resumed the Toprol.  He now has dyspnea with more moderate activities but not with routine  activities.  There is no orthopnea, PND, pedal edema, palpitations,  presyncope, syncope or chest pain.  He continues to have some fatigue on  the Toprol and mildly reduced sex drive.   MEDICATIONS:  1. Multivitamin daily.  2. Omega 3 fish oil.  3. Toprol 25 mg p.o. daily.  4. Xyzal.   PHYSICAL EXAMINATION:  Shows a blood pressure of 121/75 and his pulse is  54.  HEENT:  Normal.  NECK:  Supple.  CHEST:  Clear.  CARDIOVASCULAR:  Reveals a regular rate and rhythm and there is a 2/6  systolic murmur at the left mid sternal border.  It increases to a 3/6  systolic murmur with Valsalva.  There is also 1/6 systolic murmur at the  apex.  ABDOMINAL:  Shows no tenderness.  EXTREMITIES:  Show no edema.   His electrocardiogram shows a sinus bradycardia at a rate of 52.  There  are no ST changes.   DIAGNOSES:  1. Dyspnea -- This appears to be secondary to his obstructive      physiology noted on his echocardiogram in June.  Verapamil caused      constipation and he has resumed his Toprol.  This is causing some      degree of fatigue and reduced sex drive, although he states it      could be  tolerable.  However, we will try Cardizem and      discontinuing his Toprol to see if this improves his symptoms.  If      not, I have asked him to resume his Toprol.  We will begin with      Cardizem CD 120 mg p.o. daily.  2. Hypertrophic obstructive physiology -- His electrocardiogram is not      consistent with hypertrophic obstructive cardiomyopathy, but his      echo does show obstructive physiology with systolic motion of the      anterior mitral valve and an left ventricular outflow tract      gradient.  3. Hypertension -- His blood pressure is adequately controlled.   I will see him back in 6 months and we will most likely repeat his  echocardiogram at that time.     George Frieze [name], MD, [facility]  Electronically Signed  BSC/MedQ  DD: 12/24/2006  DT: 12/24/2006  Job #: [id number]

## 2010-05-27 NOTE — Assessment & Plan Note (Signed)
[facility] HEALTHCARE                            CARDIOLOGY OFFICE NOTE   George, Favaro KREED [name]                        MRN:          [id number]  DATE:03/09/2008                            DOB:          1950-12-11    George [name] is a pleasant gentleman who has a history of left ventricular  hypertrophy with systolic anterior motion of the mitral valve and  Doppler evidence of a dynamic left outflow obstruction with Valsalva.  A  previous Myoview in July 2008 showed no ischemia or infarction.  Also  note that he exercised for 7 minutes and 16 seconds, and there was  normal blood pressure response.  Since I last saw him, he had dyspnea  with more extreme activities, but not with routine activities.  There is  no orthopnea or PND, chest pain, or syncope.  He occasionally feels  minimal pedal edema.  Note, he has no family history of sudden death.   His medications at present include:  1. Multivitamin.  2. Omega fish oil.  3. Zyrtec 10 mg p.o. daily.  4. Metoprolol ER 50 mg p.o. daily.  5. Resveratrol 325 mg p.o. daily.  6. L-carnitine.  7. Fluconazole.   On physical exam shows a blood pressure of 110/70 and his pulse is 60.  He weighs 238 pounds.  His HEENT is normal.  His neck is supple.  His  chest is clear.  His cardiovascular exam reveals a regular rate rhythm.  There is a 2/6 systolic murmur at the left sternal border with Valsalva.  Abdominal exam shows no tenderness.  Extremities show no edema.   His last echocardiogram on September 26, 2007, showed normal LV  function.  There was mild LVH and his septum measured 15.4 mm and his  posterior wall measure 14.4 mm.  His electrocardiogram today shows a  sinus bradycardia at a rate of 51.  The axis is normal.  There are no ST  changes noted.   DIAGNOSES:  1. Hypertrophic obstructive physiology - the patient's symptoms appear      to be reasonably stable.  We will continue with his Toprol 50 mg      p.o.  daily.  I cannot advance this further given his borderline      heart rate and blood pressure.  However, I think his symptoms are      tolerable at this point.  I have asked him to have his siblings      have echocardiograms and electrocardiograms to make sure they do      not have hypertrophic obstructive cardiomyopathy.  I am not      convinced that the patient has this, but he certainly has the      physiology.  Note, he has no children.  Also note, he has no risk      factors for sudden death including no history of syncope, normal      blood pressure response on treadmill, and his septum measures 15.4      mm.  Also note, there is no family history of sudden death.  2. Hypertension - his blood pressure is adequately controlled on his      present medications.  3. Dyspnea - this is most likely due to his obstructive physiology.   I will see him back in 6 months.     [name] [name], MD, [facility]  Electronically Signed    BSC/MedQ  DD: 03/09/2008  DT: 03/09/2008  Job #: [id number]

## 2010-05-27 NOTE — Assessment & Plan Note (Signed)
[facility] HEALTHCARE                            CARDIOLOGY OFFICE NOTE   NAME:[name], [name]                        MRN:          [id number]  DATE:08/19/2007                            DOB:          10/19/1950    George [name] is a 57-year gentleman who has a history of left ventricular  hypertrophy with systolic anterior motion of the mitral valve and  Doppler evidence of a dynamic left ventricular outflow obstruction with  Valsalva.  Previous Myoview showed no ischemia or infarction and that  was performed in July 2008.  His most recent echo on July 06, 2006  showed a hyperdynamic LV with systolic anterior motion and Doppler  evidence of a dynamic LVOT obstruction during Valsalva with a peak  velocity of 3.4 m/s.  There was mild mitral regurgitation.  Since I last  saw George [name], he is having dyspnea on exertion, which appears to be somewhat  worse compared to previous.  It typically occurs with more extreme  activities, but not with routine activities.  There is no orthopnea or  PND, but there is mild pedal edema.  He has not had chest pain,  palpitations, or syncope.   His medications at present include,  1. Multivitamin.  2. Fish oil.  3. Toprol 25 mg p.o. daily.  4. Xyzal.  5. Zyrtec.   Physical exam today shows a blood pressure of 137/89, his pulse is 69.  His weight is 233 pounds.  His HEENT is normal.  His neck is supple.  His chest is clear.  His cardiovascular exam reveals a regular rate and  rhythm.  There is a 2-3/6 systolic murmur at the left sternal border  that increases with Valsalva.  Abdominal exam shows no tenderness.  Extremities show no edema.   His electrocardiogram shows a sinus rhythm at a rate of 62.  There are  no ST changes noted.   DIAGNOSES:  1. Dyspnea - This is secondary most likely to his obstructive      physiology on his echocardiogram.  He has not tolerated verapamil      or Cardizem in the past.  I will increase his Toprol  to 50 mg p.o.      daily to see if he tolerates.  Hopefully, this will help with his      dyspnea.  2. Hypertrophic obstructive physiology - As per #1.  3. Hypertension - His blood pressure is mildly elevated.  However, we      are increasing his Toprol.   We will check his echocardiogram.  I will see George [name] back in 6-9 months.    [name] [name], MD, [facility]  Electronically Signed   BSC/MedQ  DD: 08/19/2007  DT: 08/20/2007  Job #: [id number]

## 2010-05-27 NOTE — Assessment & Plan Note (Signed)
[facility] HEALTHCARE                            CARDIOLOGY OFFICE NOTE   NAME:George [name], George [name]                        MRN:          [id number]  DATE:07/21/2006                            DOB:          [birth year]    George [name] is a very pleasant 60 year old gentleman who I am asked to  evaluate for dyspnea and murmur. He has no prior cardiac history. Over  the past year, he has noticed that he has developed dyspnea when he is  walking up to three blocks. He does not have a history with routine  activities. There is no orthopnea, PND, pedal edema, palpitations. Pre-  syncope or syncope or chest pain. Dr.  Amador Cunas ordered an echo as he  did notice some murmur as well. His ejection fraction was 70% and the  systolic function was hyperdynamic. There was mild LVH. There was  systolic anterior motion of the mitral valve. There was Doppler evidence  of a dynamic LV outflow obstruction on Valsalva with a peak velocity of  3.4 meters/second and a peak gradient of 46 mmHg. There was mild mitral  regurgitation. Because of this, we were asked to further evaluate.   MEDICATIONS:  1. Zyrtec over-the-counter.  2. Multivitamin.  3. Omega-3.   ALLERGIES:  SULFA.   SOCIAL HISTORY:  He has a remote history of tobacco use, but has not  smoked since the 1980s. He occasionally consumes a glass of wine.   FAMILY HISTORY:  Is negative for coronary artery disease or sudden  death. There is no history of cardiomyopathy by his report.   PAST MEDICAL HISTORY:  There is no diabetes mellitus or hyperlipidemia.  He has had borderline hypertension in the past. He has had a prior  tonsillectomy. There is no other past medical history noted.   REVIEW OF SYSTEMS:  He denies any headaches or visual changes. There is  no productive cough or hemoptysis. There is no dysphagia, odynophagia,  melena or hematochezia. There is no dysuria or hematuria. There is no  rash or seizure activity.  There is no orthopnea, PND or pedal edema.  There is no claudication. The remainder of review of systems are  negative.   PHYSICAL EXAMINATION:  He weighs 241 pounds. His blood pressure is  133/88 and pulse is 63. He is well-developed and well-nourished in no  acute distress.  SKIN: Is warm and dry.  He does not appear to be depressed.  There is no peripheral clubbing.  His back is normal.  HEENT: Is normal with normal eyelids.  NECK: Supple, with a normal upstroke bilaterally. There are no bruits.  There is no jugular venous distention and no thyromegaly is noted.  CHEST: Clear to auscultation, normal expansion.  CARDIOVASCULAR: Reveals a regular rate and rhythm. There is a 2/6  systolic murmur at the left sternal border. It increases with Valsalva  significantly. There is no S3 or S4. There is no rub noted. His PMI is  nondisplaced.  ABDOMEN: Nontender. Positive bowel sounds. No hepatosplenomegaly. No  masses appreciated. There is no abdominal bruit.  He has 2+  femoral pulses bilaterally. No bruits.  EXTREMITIES: Show no edema.  I can palpate no cords. He has 2+ posterior  tibial pulses bilaterally.  NEUROLOGIC: Examination is grossly intact.   ELECTROCARDIOGRAM: Shows a sinus rhythm at a rate of 59. There are no ST  changes noted.   DIAGNOSIS:  1. Dyspnea. This may be secondary to his obstructive physiology noted      on his echocardiogram. He has systolic anterior motion of the      mitral valve with concentric LVH. This is also noted on physical      examination. We will begin Toprol 25 mg p.o. daily to see if this      improves his symptoms. We can increase this as tolerated by pulse      and blood pressure. He is contemplating beginning an exercise      program and we will schedule him to have a stress Myoview for risk      stratification prior to that.  2. Possible hypertrophic obstructive cardiomyopathy. His      electrocardiogram is not consistent with this, but his  EKG does      show hyperdynamic function with systolic anterior motion and an      LVOT gradient. This is also noted on physical examination. I have      asked him to have his siblings screened for hypertrophic      cardiomyopathy. Note, he does not have children.  3. Hypertension. His blood pressure is minimally elevated, but the      Toprol should help with this as well.   I will see him back in approximately 8 weeks and we will adjust his  medications as indicated.     [name] [name], MD, [facility]  Electronically Signed    BSC/MedQ  DD: 07/21/2006  DT: 07/21/2006  Job #: [id number]   cc:   Gordy Savers, MD

## 2010-05-27 NOTE — Assessment & Plan Note (Signed)
[facility] HEALTHCARE                            CARDIOLOGY OFFICE NOTE   George [name], George [name] George [name]                        MRN:          [id number]  DATE:09/20/2006                            DOB:          [birth year]    The patient is a 60 year old gentleman who I have recently seen for  dyspnea and a murmur.  An echocardiogram  has shown left ventricular  hypertrophy, systolic anterior motion of the mitral valve and Doppler  evidence of a dynamic LV outflow obstruction on Valsalva.  When I last  saw him, we also scheduled him to have a Myoview which was performed on  August 02, 2006.  There was no ischemia or infarction and his ejection  fraction was noted to be 69%.  We did place the patient on Toprol to see  if that would help his symptoms.  It initially improved but now he  states he continues to have some dyspnea on exertion.  There is no  orthopnea, PND, pedal edema, palpitations, presyncope, syncope or chest  pain.  He has noticed some fatigue with the Toprol and reduced sex  drive.   PRESENT MEDICATIONS:  1. Zyrtec.  2. Multivitamin.  3. Omega III.  4. Toprol 25 mg p.o. daily.   PHYSICAL EXAMINATION:  VITAL SIGNS:  Blood pressure 111/76, pulse 73,  weight 246 pounds.  HEENT:  Normal.  NECK:  Supple with no bruits.  CHEST:  Clear.  CARDIOVASCULAR:  Regular rate and rhythm.  There is a 3/6 systolic  murmur that increases with Valsalva.  ABDOMEN:  Benign.  EXTREMITIES:  No edema.   DIAGNOSES:  1. Dyspnea - this appears to be secondary to his obstructive      physiology noted on his echocardiogram.  He is having problems with      the Toprol including fatigue and reduced sex drive.  We have asked      him to discontinue this and instead, I will try verapamil.  We will      begin with 40 mg p.o. t.i.d. and I will increase this as tolerated      by pulse and blood pressure.  2. Possible hypertrophic obstructive cardiomyopathy - again his  electrocardiogram is not consistent with this, but his      echocardiogram  did show hyperdynamic left ventricular function      with systolic anterior motion of the mitral valve and left      ventricular outflow tract gradient.  3. Hypertension - this will be treated with verapamil as described      above.  I will see him back in three months to adjust his      medications as indicated.     [name] [name], MD, [facility]  Electronically Signed    BSC/MedQ  DD: 09/20/2006  DT: 09/21/2006  Job #: [id number]

## 2010-05-30 NOTE — Assessment & Plan Note (Signed)
[facility] OFFICE NOTE   George [name], George [name] ROBEL WUERTZ                        MRN:          [id number]  DATE:01/25/2006                            DOB:          1950-01-23    A 60 year old gentleman seen today to re-establish with our practice.  He has enjoyed excellent health, he has had no hospital admissions.  He  does have a long history of seasonal allergic rhinitis.  HE HAS A SULFA  ALLERGY.  He takes Claritin-D OTC, otherwise no chronic medications.   REVIEW OF SYSTEMS:  Exam is fairly unremarkable.  Sounds like he may  have had acute hepatitis as a young adult, an acute prostate infection  at age 36.  No colon screening.   SOCIAL HISTORY:  He is married, was born in [place].  Works as an Film/video editor.   FAMILY HISTORY:  Both parents died of pancreatic cancer.  Father also  had a history of cerebral aneurysm and cerebrovascular disease.  One  brother died of apparent colonic perforation.   EXAMINATION:  Revealed an overweight male in no acute distress.  Blood  pressure 122/82.  FUNDI, EAR, NOSE AND THROAT:  Clear.  NECK:  No bruits or adenopathy.  CHEST:  Clear.  CARDIOVASCULAR:  Normal heart sounds, no murmurs.  ABDOMEN:  Obese, soft, nontender, no organomegaly.  EXTERNAL GENITALIA:  Normal.  RECTAL:  Prostate small, stool heme negative.  Some external  hemorrhoidal tags noted.  EXTREMITIES:  Vein peripheral pulses, no edema.  NEURO:  Negative.   IMPRESSION:  1. Seasonal allergic rhinitis.  2. Obesity.   DISPOSITION:  Weight loss encouraged.  Laboratory profile will be  reviewed.  He was set for a screening colon evaluation at his  convenience.     Gordy Savers, MD  Electronically Signed    PFK/MedQ  DD: 01/25/2006  DT: 01/25/2006  Job #: [id number]

## 2010-06-02 ENCOUNTER — Other Ambulatory Visit: Payer: Self-pay | Admitting: Internal Medicine

## 2010-08-22 ENCOUNTER — Encounter: Payer: Self-pay | Admitting: Family Medicine

## 2010-08-22 ENCOUNTER — Ambulatory Visit (INDEPENDENT_AMBULATORY_CARE_PROVIDER_SITE_OTHER): Payer: BC Managed Care – PPO | Admitting: Family Medicine

## 2010-08-22 VITALS — BP 124/80 | HR 66 | Temp 98.6°F | Wt 232.0 lb

## 2010-08-22 DIAGNOSIS — J329 Chronic sinusitis, unspecified: Secondary | ICD-10-CM

## 2010-08-22 DIAGNOSIS — J309 Allergic rhinitis, unspecified: Secondary | ICD-10-CM

## 2010-08-22 DIAGNOSIS — Z9109 Other allergy status, other than to drugs and biological substances: Secondary | ICD-10-CM

## 2010-08-22 MED ORDER — METHYLPREDNISOLONE ACETATE 80 MG/ML IJ SUSP
120.0000 mg | Freq: Once | INTRAMUSCULAR | Status: AC
  Administered 2010-08-22: 120 mg via INTRAMUSCULAR

## 2010-08-22 MED ORDER — MONTELUKAST SODIUM 10 MG PO TABS: 10.0000 mg | ORAL_TABLET | Freq: Every day | ORAL | Status: DC

## 2010-08-22 MED ORDER — AZELASTINE HCL 0.1 % NA SOLN: 1.0000 | Freq: Two times a day (BID) | NASAL | Status: DC

## 2010-08-22 MED ORDER — AZITHROMYCIN 250 MG PO TABS: ORAL_TABLET | ORAL | Status: AC

## 2010-08-22 NOTE — Progress Notes (Signed)
  Subjective:    Patient ID: George [name], male    DOB: [birth year], 60 y.o.   MRN: [id number]  HPI Here for 4 weeks of stuffy nose, sinus pressure, and yellow nasal mucus. He is using Flonase and Zyrtec daily. 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 He was tested by Dr. Maple Hudson last year and found to be highly reactive to mold and mildew. No fevers or cough or SOB.    Review of Systems  Constitutional: Negative.   HENT: Positive for congestion, postnasal drip and sinus pressure.   Eyes: Negative.   Respiratory: Negative.        Objective:   Physical Exam  Constitutional: He appears well-developed and well-nourished.  HENT:  Right Ear: External ear normal.  Left Ear: External ear normal.  Nose: Nose normal.  Mouth/Throat: Oropharynx is clear and moist. No oropharyngeal exudate.  Eyes: Conjunctivae are normal. Pupils are equal, round, and reactive to light.  Neck: Neck supple. No thyromegaly present.  Pulmonary/Chest: Effort normal and breath sounds normal.  Lymphadenopathy:    He has no cervical adenopathy.          Assessment & Plan:  He probably has a sinus infection on top of significant allergies. Advised him to try to get moved out of the basement on his job. Given a steroid shot and a Zpack today. Get back on Singulair. Try Astepro sprays.

## 2010-12-21 ENCOUNTER — Other Ambulatory Visit: Payer: Self-pay | Admitting: Internal Medicine

## 2011-02-16 ENCOUNTER — Other Ambulatory Visit: Payer: Self-pay | Admitting: Internal Medicine

## 2011-04-02 ENCOUNTER — Other Ambulatory Visit: Payer: Self-pay | Admitting: Cardiology

## 2011-04-22 ENCOUNTER — Encounter: Payer: Self-pay | Admitting: Internal Medicine

## 2011-04-22 ENCOUNTER — Ambulatory Visit (INDEPENDENT_AMBULATORY_CARE_PROVIDER_SITE_OTHER): Payer: BC Managed Care – PPO | Admitting: Internal Medicine

## 2011-04-22 VITALS — BP 124/70 | Temp 98.4°F | Wt 224.0 lb

## 2011-04-22 DIAGNOSIS — J309 Allergic rhinitis, unspecified: Secondary | ICD-10-CM

## 2011-04-22 DIAGNOSIS — I421 Obstructive hypertrophic cardiomyopathy: Secondary | ICD-10-CM

## 2011-04-22 DIAGNOSIS — I10 Essential (primary) hypertension: Secondary | ICD-10-CM

## 2011-04-22 MED ORDER — METOPROLOL SUCCINATE ER 50 MG PO TB24: 50.0000 mg | ORAL_TABLET | Freq: Every day | ORAL | Status: DC

## 2011-04-22 MED ORDER — AZELASTINE HCL 0.1 % NA SOLN: 1.0000 | Freq: Two times a day (BID) | NASAL | Status: DC

## 2011-04-22 MED ORDER — MONTELUKAST SODIUM 10 MG PO TABS: 10.0000 mg | ORAL_TABLET | Freq: Every day | ORAL | Status: DC

## 2011-04-22 MED ORDER — FLUTICASONE PROPIONATE 50 MCG/ACT NA SUSP: 2.0000 | Freq: Every day | NASAL | Status: DC

## 2011-04-22 NOTE — Progress Notes (Signed)
  Subjective:    Patient ID: George [name], male    DOB: Feb 03, 1950, 61 y.o.   MRN: [id number]  HPI  61 year old patient who has a history of vasomotor rhinitis. He was seen by ophthalmology approximately 2 weeks ago with a viral conjunctivitis. This is slowly improving. For the past 4 or 5 days she has had increasing sore throat cold symptoms and some drainage. He does have a history of HOCM which has been stable and well controlled on modest dose beta blocker therapy. No fever    Review of Systems  Constitutional: Negative for fever, chills, appetite change and fatigue.  HENT: Positive for congestion, rhinorrhea and postnasal drip. Negative for hearing loss, ear pain, sore throat, trouble swallowing, neck stiffness, dental problem, voice change and tinnitus.   Eyes: Negative for pain, discharge and visual disturbance.  Respiratory: Negative for cough, chest tightness, wheezing and stridor.   Cardiovascular: Negative for chest pain, palpitations and leg swelling.  Gastrointestinal: Negative for nausea, vomiting, abdominal pain, diarrhea, constipation, blood in stool and abdominal distention.  Genitourinary: Negative for urgency, hematuria, flank pain, discharge, difficulty urinating and genital sores.  Musculoskeletal: Negative for myalgias, back pain, joint swelling, arthralgias and gait problem.  Skin: Negative for rash.  Neurological: Negative for dizziness, syncope, speech difficulty, weakness, numbness and headaches.  Hematological: Negative for adenopathy. Does not bruise/bleed easily.  Psychiatric/Behavioral: Negative for behavioral problems and dysphoric mood. The patient is not nervous/anxious.        Objective:   Physical Exam  Constitutional: He is oriented to person, place, and time. He appears well-developed.  HENT:  Head: Normocephalic.  Right Ear: External ear normal.  Left Ear: External ear normal.  Mouth/Throat: Oropharynx is clear and moist.       Pharyngeal crowding    Eyes: Conjunctivae and EOM are normal.       Conjunctival injection  Neck: Normal range of motion.  Cardiovascular: Normal rate.   Murmur heard.      Grade 3/6 systolic murmur  Pulmonary/Chest: Effort normal and breath sounds normal.  Abdominal: Bowel sounds are normal.  Musculoskeletal: Normal range of motion. He exhibits no edema and no tenderness.  Neurological: He is alert and oriented to person, place, and time.  Psychiatric: He has a normal mood and affect. His behavior is normal.          Assessment & Plan:   Resolving viral conjunctivitis Viral URI versus flare allergic rhinitis  We'll intensify medications. Recommend Aleve 2 twice daily  Schedule CPX at his convenience

## 2011-04-22 NOTE — Patient Instructions (Signed)
Aleve 2 tablets twice daily   Continue all allergy medications  Call or return to clinic prn if these symptoms worsen or fail to improve as anticipated.

## 2011-05-07 ENCOUNTER — Ambulatory Visit (INDEPENDENT_AMBULATORY_CARE_PROVIDER_SITE_OTHER): Payer: BC Managed Care – PPO | Admitting: Cardiology

## 2011-05-07 ENCOUNTER — Encounter: Payer: Self-pay | Admitting: Cardiology

## 2011-05-07 VITALS — BP 120/80 | HR 51 | Ht 70.0 in | Wt 227.8 lb

## 2011-05-07 DIAGNOSIS — I10 Essential (primary) hypertension: Secondary | ICD-10-CM

## 2011-05-07 DIAGNOSIS — I421 Obstructive hypertrophic cardiomyopathy: Secondary | ICD-10-CM

## 2011-05-07 NOTE — Progress Notes (Signed)
   HPI: [name] is a pleasant gentleman who has a history of left ventricular hypertrophy with systolic anterior motion of the mitral valve and Doppler evidence of a dynamic left outflow obstruction with Valsalva.  Last echocardiogram was performed in March of 2011. There was normal LV function, chordal SAM causing a narrow LVOT and elevated LVOT gradient at rest with a mean gradient of 71 mmHg. Holter monitor in April of 2011 showed no nonsustained tachycardia. A previous Myoview in July 2008 showed no ischemia or infarction.  Also note that he exercised for 7 minutes and 16 seconds, and there was normal blood pressure response. Exercise treadmill in March of 2012 showed normal systolic blood pressure response to exercise. I last saw him in March of 2012. Since then he does have some dyspnea on exertion but no orthopnea, PND, palpitations, syncope or chest pain. He occasionally has mild pedal edema. He does not feel his symptoms have changed since I last saw him.  Current Outpatient Prescriptions  Medication Sig Dispense Refill  . azelastine (ASTELIN) 137 MCG/SPRAY nasal spray Place 1 spray into the nose 2 (two) times daily. Use in each nostril as directed  30 mL  11  . cetirizine (ZYRTEC) 10 MG tablet Take 10 mg by mouth daily.        . fish oil-omega-3 fatty acids 1000 MG capsule Take 2 g by mouth daily.        . fluticasone (FLONASE) 50 MCG/ACT nasal spray Place 2 sprays into the nose daily.  16 g  6  . metoprolol succinate (TOPROL-XL) 50 MG 24 hr tablet Take 1 tablet (50 mg total) by mouth daily. Take with or immediately following a meal.  90 tablet  12  . Multiple Vitamin (MULTIVITAMIN) tablet Take 1 tablet by mouth 3 (three) times daily.        Marland Kitchen RESVERATROL PO Take 325 each by mouth.          Past Medical History  Diagnosis Date  . Allergy   . HOCM (hypertrophic obstructive cardiomyopathy)   . Hypertension     Past Surgical History  Procedure Date  . Tonsillectomy     History    Social History  . Marital Status: Married    Spouse Name: N/A    Number of Children: N/A  . Years of Education: N/A   Occupational History  . Not on file.   Social History Main Topics  . Smoking status: Former Smoker    Quit date: 05/06/1984  . Smokeless tobacco: Never Used  . Alcohol Use: Yes  . Drug Use: No  . Sexually Active: Not on file   Other Topics Concern  . Not on file   Social History Narrative  . No narrative on file    ROS: no fevers or chills, productive cough, hemoptysis, dysphasia, odynophagia, melena, hematochezia, dysuria, hematuria, rash, seizure activity, orthopnea, PND, pedal edema, claudication. Remaining systems are negative.  Physical Exam: Well-developed well-nourished in no acute distress.  Skin is warm and dry.  HEENT is normal.  Neck is supple.  Chest is clear to auscultation with normal expansion.  Cardiovascular exam is regular rate and rhythm. 3/6 systolic murmur left sternal border. There is increased with Valsalva. Abdominal exam nontender or distended. No masses palpated. Extremities show no edema. neuro grossly intact  ECG sinus bradycardia at a rate of 51. No ST changes.

## 2011-05-07 NOTE — Patient Instructions (Signed)

## 2011-05-07 NOTE — Assessment & Plan Note (Signed)
Blood pressure controlled. Continue present medications. 

## 2011-05-07 NOTE — Assessment & Plan Note (Signed)
Patient does note some dyspnea on exertion but his symptoms are unchanged. Plan repeat echocardiogram. Continue Toprol. If his symptoms worsen in the future we could refer for myectomy. He has no risk factors for sudden death including no family history of sudden death, no syncope, no nonsustained ventricular tachycardia on Holter. Repeat exercise treadmill showed normal increase in systolic pressure with exercise.Marland Kitchen

## 2011-05-13 ENCOUNTER — Ambulatory Visit (HOSPITAL_COMMUNITY): Payer: BC Managed Care – PPO | Attending: Cardiology

## 2011-05-13 ENCOUNTER — Other Ambulatory Visit: Payer: Self-pay

## 2011-05-13 DIAGNOSIS — R0989 Other specified symptoms and signs involving the circulatory and respiratory systems: Secondary | ICD-10-CM | POA: Insufficient documentation

## 2011-05-13 DIAGNOSIS — I10 Essential (primary) hypertension: Secondary | ICD-10-CM | POA: Insufficient documentation

## 2011-05-13 DIAGNOSIS — Z87891 Personal history of nicotine dependence: Secondary | ICD-10-CM | POA: Insufficient documentation

## 2011-05-13 DIAGNOSIS — I059 Rheumatic mitral valve disease, unspecified: Secondary | ICD-10-CM | POA: Insufficient documentation

## 2011-05-13 DIAGNOSIS — R0609 Other forms of dyspnea: Secondary | ICD-10-CM | POA: Insufficient documentation

## 2011-05-13 DIAGNOSIS — I421 Obstructive hypertrophic cardiomyopathy: Secondary | ICD-10-CM | POA: Insufficient documentation

## 2012-04-30 ENCOUNTER — Other Ambulatory Visit: Payer: Self-pay | Admitting: Internal Medicine

## 2012-05-20 ENCOUNTER — Ambulatory Visit (INDEPENDENT_AMBULATORY_CARE_PROVIDER_SITE_OTHER): Payer: BC Managed Care – PPO | Admitting: Internal Medicine

## 2012-05-20 ENCOUNTER — Encounter: Payer: Self-pay | Admitting: Internal Medicine

## 2012-05-20 VITALS — BP 140/90 | HR 50 | Temp 98.8°F | Resp 20 | Wt 211.0 lb

## 2012-05-20 DIAGNOSIS — I421 Obstructive hypertrophic cardiomyopathy: Secondary | ICD-10-CM

## 2012-05-20 DIAGNOSIS — R0602 Shortness of breath: Secondary | ICD-10-CM

## 2012-05-20 DIAGNOSIS — M545 Low back pain: Secondary | ICD-10-CM

## 2012-05-20 MED ORDER — AZELASTINE HCL 0.1 % NA SOLN: 1.0000 | Freq: Two times a day (BID) | NASAL | Status: DC

## 2012-05-20 MED ORDER — HYDROCORTISONE 2.5 % RE CREA: TOPICAL_CREAM | Freq: Two times a day (BID) | RECTAL | Status: DC

## 2012-05-20 MED ORDER — FLUTICASONE PROPIONATE 50 MCG/ACT NA SUSP: NASAL | Status: DC

## 2012-05-20 NOTE — Progress Notes (Signed)
Subjective:    Patient ID: George [name], male    DOB: 1950/08/11, 62 y.o.   MRN: [id number]  HPI  62 year old patient who presents with a chief complaint of low back pain. He has a history of external hemorrhoids and was concerned about pain related to hemorrhoidal disease. He's had some increasing constipation issues.  He is followed by cardiology for hypertrophic cardiomyopathy and this has done well on modest dose of beta blocker therapy. He has history also of allergic rhinitis.  Past Medical History  Diagnosis Date  . Allergy   . HOCM (hypertrophic obstructive cardiomyopathy)   . Hypertension     History   Social History  . Marital Status: Married    Spouse Name: N/A    Number of Children: N/A  . Years of Education: N/A   Occupational History  . Not on file.   Social History Main Topics  . Smoking status: Former Smoker    Quit date: 05/06/1984  . Smokeless tobacco: Never Used  . Alcohol Use: Yes  . Drug Use: No  . Sexually Active: Not on file   Other Topics Concern  . Not on file   Social History Narrative  . No narrative on file    Past Surgical History  Procedure Laterality Date  . Tonsillectomy      Family History  Problem Relation Age of Onset  . Cancer Father     pancreatic    Allergies  Allergen Reactions  . Sulfonamide Derivatives     Current Outpatient Prescriptions on File Prior to Visit  Medication Sig Dispense Refill  . cetirizine (ZYRTEC) 10 MG tablet Take 10 mg by mouth daily.        . metoprolol succinate (TOPROL-XL) 50 MG 24 hr tablet Take 1 tablet (50 mg total) by mouth daily. Take with or immediately following a meal.  90 tablet  12  . Multiple Vitamin (MULTIVITAMIN) tablet Take 1 tablet by mouth 3 (three) times daily.        Marland Kitchen RESVERATROL PO Take 325 each by mouth.        No current facility-administered medications on file prior to visit.    BP 140/90  Pulse 50  Temp(Src) 98.8 F (37.1 C) (Oral)  Resp 20  Wt 211 lb (95.709  kg)  BMI 30.28 kg/m2  SpO2 98%       Review of Systems  Constitutional: Negative for fever, chills, appetite change and fatigue.  HENT: Positive for congestion, rhinorrhea and sinus pressure. Negative for hearing loss, ear pain, sore throat, trouble swallowing, neck stiffness, dental problem, voice change and tinnitus.   Eyes: Negative for pain, discharge and visual disturbance.  Respiratory: Negative for cough, chest tightness, wheezing and stridor.   Cardiovascular: Negative for chest pain, palpitations and leg swelling.  Gastrointestinal: Negative for nausea, vomiting, abdominal pain, diarrhea, constipation, blood in stool and abdominal distention.  Genitourinary: Negative for urgency, hematuria, flank pain, discharge, difficulty urinating and genital sores.  Musculoskeletal: Positive for back pain. Negative for myalgias, joint swelling, arthralgias and gait problem.  Skin: Negative for rash.  Neurological: Negative for dizziness, syncope, speech difficulty, weakness, numbness and headaches.  Hematological: Negative for adenopathy. Does not bruise/bleed easily.  Psychiatric/Behavioral: Negative for behavioral problems and dysphoric mood. The patient is not nervous/anxious.        Objective:   Physical Exam  Constitutional: He is oriented to person, place, and time. He appears well-developed.  HENT:  Head: Normocephalic.  Right Ear: External ear normal.  Left Ear: External ear normal.  Eyes: Conjunctivae and EOM are normal.  Neck: Normal range of motion.  Cardiovascular: Normal rate.   Murmur heard. Grade 3/6 systolic murmur  Pulmonary/Chest: Breath sounds normal.  Abdominal: Bowel sounds are normal.  Genitourinary:  External noninflamed hemorrhoids  Musculoskeletal: Normal range of motion. He exhibits no edema and no tenderness.  Negative straight leg test No lumbar pain to palpation Full range of motion of both hips  Neurological: He is alert and oriented to person,  place, and time.  Psychiatric: He has a normal mood and affect. His behavior is normal.          Assessment & Plan:   Low back pain External hemorrhoids. Doubt these are symptomatic Mild constipation Allergic rhinitis Hypertrophic cardiomyopathy. No change therapy. Follow cardiology

## 2012-05-20 NOTE — Patient Instructions (Addendum)
Constipation, Adult Constipation is when a person has fewer than 3 bowel movements a week; has difficulty having a bowel movement; or has stools that are dry, hard, or larger than normal. As people grow older, constipation is more common. If you try to fix constipation with medicines that make you have a bowel movement (laxatives), the problem may get worse. Long-term laxative use may cause the muscles of the colon to become weak. A low-fiber diet, not taking in enough fluids, and taking certain medicines may make constipation worse. CAUSES   Certain medicines, such as antidepressants, pain medicine, iron supplements, antacids, and water pills.   Certain diseases, such as diabetes, irritable bowel syndrome (IBS), thyroid disease, or depression.   Not drinking enough water.   Not eating enough fiber-rich foods.   Stress or travel.  Lack of physical activity or exercise.  Not going to the restroom when there is the urge to have a bowel movement.  Ignoring the urge to have a bowel movement.  Using laxatives too much. SYMPTOMS   Having fewer than 3 bowel movements a week.   Straining to have a bowel movement.   Having hard, dry, or larger than normal stools.   Feeling full or bloated.   Pain in the lower abdomen.  Not feeling relief after having a bowel movement. DIAGNOSIS  Your caregiver will take a medical history and perform a physical exam. Further testing may be done for severe constipation. Some tests may include:   A barium enema X-ray to examine your rectum, colon, and sometimes, your small intestine.  A sigmoidoscopy to examine your lower colon.  A colonoscopy to examine your entire colon. TREATMENT  Treatment will depend on the severity of your constipation and what is causing it. Some dietary treatments include drinking more fluids and eating more fiber-rich foods. Lifestyle treatments may include regular exercise. If these diet and lifestyle recommendations  do not help, your caregiver may recommend taking over-the-counter laxative medicines to help you have bowel movements. Prescription medicines may be prescribed if over-the-counter medicines do not work.  HOME CARE INSTRUCTIONS   Increase dietary fiber in your diet, such as fruits, vegetables, whole grains, and beans. Limit high-fat and processed sugars in your diet, such as [place] fries, hamburgers, cookies, candies, and soda.   A fiber supplement may be added to your diet if you cannot get enough fiber from foods.   Drink enough fluids to keep your urine clear or pale yellow.   Exercise regularly or as directed by your caregiver.   Go to the restroom when you have the urge to go. Do not hold it.  Only take medicines as directed by your caregiver. Do not take other medicines for constipation without talking to your caregiver first. SEEK IMMEDIATE MEDICAL CARE IF:   You have bright red blood in your stool.   Your constipation lasts for more than 4 days or gets worse.   You have abdominal or rectal pain.   You have thin, pencil-like stools.  You have unexplained weight loss. MAKE SURE YOU:   Understand these instructions.  Will watch your condition.  Will get help right away if you are not doing well or get worse. Document Released: 09/27/2003 Document Revised: 03/23/2011 Document Reviewed: 12/02/2010 [facility] Patient Information 2013 [place], [place]. Hemorrhoids Hemorrhoids are enlarged (dilated) veins around the rectum. There are 2 types of hemorrhoids, and the type of hemorrhoid is determined by its location. Internal hemorrhoids occur in the veins just inside the rectum.They are  usually not painful, but they may bleed.However, they may poke through to the outside and become irritated and painful. External hemorrhoids involve the veins outside the anus and can be felt as a painful swelling or hard lump near the anus.They are often itchy and may crack and bleed.  Sometimes clots will form in the veins. This makes them swollen and painful. These are called thrombosed hemorrhoids. CAUSES Causes of hemorrhoids include:  Pregnancy. This increases the pressure in the hemorrhoidal veins.  Constipation.  Straining to have a bowel movement.  Obesity.  Heavy lifting or other activity that caused you to strain. TREATMENT Most of the time hemorrhoids improve in 1 to 2 weeks. However, if symptoms do not seem to be getting better or if you have a lot of rectal bleeding, your caregiver may perform a procedure to help make the hemorrhoids get smaller or remove them completely.Possible treatments include:  Rubber band ligation. A rubber band is placed at the base of the hemorrhoid to cut off the circulation.  Sclerotherapy. A chemical is injected to shrink the hemorrhoid.  Infrared light therapy. Tools are used to burn the hemorrhoid.  Hemorrhoidectomy. This is surgical removal of the hemorrhoid. HOME CARE INSTRUCTIONS   Increase fiber in your diet. Ask your caregiver about using fiber supplements.  Drink enough water and fluids to keep your urine clear or pale yellow.  Exercise regularly.  Go to the bathroom when you have the urge to have a bowel movement. Do not wait.  Avoid straining to have bowel movements.  Keep the anal area dry and clean.  Only take over-the-counter or prescription medicines for pain, discomfort, or fever as directed by your caregiver. If your hemorrhoids are thrombosed:  Take warm sitz baths for 20 to 30 minutes, 3 to 4 times per day.  If the hemorrhoids are very tender and swollen, place ice packs on the area as tolerated. Using ice packs between sitz baths may be helpful. Fill a plastic bag with ice. Place a towel between the bag of ice and your skin.  Medicated creams and suppositories may be used or applied as directed.  Do not use a donut-shaped pillow or sit on the toilet for long periods. This increases blood  pooling and pain. SEEK MEDICAL CARE IF:   You have increasing pain and swelling that is not controlled with your medicine.  You have uncontrolled bleeding.  You have difficulty or you are unable to have a bowel movement.  You have pain or inflammation outside the area of the hemorrhoids.  You have chills or an oral temperature above 102 F (38.9 C). MAKE SURE YOU:   Understand these instructions.  Will watch your condition.  Will get help right away if you are not doing well or get worse. Document Released: 12/27/1999 Document Revised: 03/23/2011 Document Reviewed: 12/09/2009 [facility] Patient Information 2013 [place], [place]. Back Pain, Adult Low back pain is very common. About 1 in 5 people have back pain.The cause of low back pain is rarely dangerous. The pain often gets better over time.About half of people with a sudden onset of back pain feel better in just 2 weeks. About 8 in 10 people feel better by 6 weeks.  CAUSES Some common causes of back pain include:  Strain of the muscles or ligaments supporting the spine.  Wear and tear (degeneration) of the spinal discs.  Arthritis.  Direct injury to the back. DIAGNOSIS Most of the time, the direct cause of low back pain is not  known.However, back pain can be treated effectively even when the exact cause of the pain is unknown.Answering your caregiver's questions about your overall health and symptoms is one of the most accurate ways to make sure the cause of your pain is not dangerous. If your caregiver needs more information, he or she may order lab work or imaging tests (X-rays or MRIs).However, even if imaging tests show changes in your back, this usually does not require surgery. HOME CARE INSTRUCTIONS For many people, back pain returns.Since low back pain is rarely dangerous, it is often a condition that people can learn to [facility] their own.   Remain active. It is stressful on the back to sit or stand in one  place. Do not sit, drive, or stand in one place for more than 30 minutes at a time. Take short walks on level surfaces as soon as pain allows.Try to increase the length of time you walk each day.  Do not stay in bed.Resting more than 1 or 2 days can delay your recovery.  Do not avoid exercise or work.Your body is made to move.It is not dangerous to be active, even though your back may hurt.Your back will likely heal faster if you return to being active before your pain is gone.  Pay attention to your body when you bend and lift. Many people have less discomfortwhen lifting if they bend their knees, keep the load close to their bodies,and avoid twisting. Often, the most comfortable positions are those that put less stress on your recovering back.  Find a comfortable position to sleep. Use a firm mattress and lie on your side with your knees slightly bent. If you lie on your back, put a pillow under your knees.  Only take over-the-counter or prescription medicines as directed by your caregiver. Over-the-counter medicines to reduce pain and inflammation are often the most helpful.Your caregiver may prescribe muscle relaxant drugs.These medicines help dull your pain so you can more quickly return to your normal activities and healthy exercise.  Put ice on the injured area.  Put ice in a plastic bag.  Place a towel between your skin and the bag.  Leave the ice on for 15 to 20 minutes, 3 to 4 times a day for the first 2 to 3 days. After that, ice and heat may be alternated to reduce pain and spasms.  Ask your caregiver about trying back exercises and gentle massage. This may be of some benefit.  Avoid feeling anxious or stressed.Stress increases muscle tension and can worsen back pain.It is important to recognize when you are anxious or stressed and learn ways to manage it.Exercise is a great option. SEEK MEDICAL CARE IF:  You have pain that is not relieved with rest or  medicine.  You have pain that does not improve in 1 week.  You have new symptoms.  You are generally not feeling well. SEEK IMMEDIATE MEDICAL CARE IF:   You have pain that radiates from your back into your legs.  You develop new bowel or bladder control problems.  You have unusual weakness or numbness in your arms or legs.  You develop nausea or vomiting.  You develop abdominal pain.  You feel faint. Document Released: 12/29/2004 Document Revised: 06/30/2011 Document Reviewed: 05/19/2010 [facility] Patient Information 2013 [place], [place].

## 2012-05-27 ENCOUNTER — Ambulatory Visit (INDEPENDENT_AMBULATORY_CARE_PROVIDER_SITE_OTHER): Payer: BC Managed Care – PPO

## 2012-05-27 ENCOUNTER — Encounter: Payer: Self-pay | Admitting: Cardiology

## 2012-05-27 ENCOUNTER — Ambulatory Visit (INDEPENDENT_AMBULATORY_CARE_PROVIDER_SITE_OTHER): Payer: BC Managed Care – PPO | Admitting: Cardiology

## 2012-05-27 ENCOUNTER — Telehealth: Payer: Self-pay | Admitting: *Deleted

## 2012-05-27 VITALS — BP 132/80 | HR 53 | Wt 211.0 lb

## 2012-05-27 DIAGNOSIS — I421 Obstructive hypertrophic cardiomyopathy: Secondary | ICD-10-CM

## 2012-05-27 DIAGNOSIS — I10 Essential (primary) hypertension: Secondary | ICD-10-CM

## 2012-05-27 NOTE — Progress Notes (Signed)
HPI: [name] is a pleasant gentleman who has a history of left ventricular hypertrophy with systolic anterior motion of the mitral valve and Doppler evidence of a dynamic left outflow obstruction with Valsalva. Holter monitor in April of 2011 showed no nonsustained tachycardia. A previous Myoview in July 2008 showed no ischemia or infarction. Also note that he exercised for 7 minutes and 16 seconds, and there was normal blood pressure response. Exercise treadmill in March of 2012 showed normal systolic blood pressure response to exercise. Last echocardiogram in May of 2013 showed normal LV function, grade 2 diastolic dysfunction, systolic anterior motion of the mitral valve with a peak LVOT gradient of 62 mm of mercury. There was mild mitral regurgitation and mild left atrial enlargement. Trace aortic insufficiency. I last saw him in March of 2012. Since then he does have some dyspnea on exertion but no orthopnea, PND, palpitations, syncope or chest pain. He occasionally has mild pedal edema. He does not feel his symptoms have changed since I last saw him.   Current Outpatient Prescriptions  Medication Sig Dispense Refill  . azelastine (ASTELIN) 137 MCG/SPRAY nasal spray Place 1 spray into the nose 2 (two) times daily. Use in each nostril as directed  30 mL  3  . cetirizine (ZYRTEC) 10 MG tablet Take 10 mg by mouth daily.        . fluticasone (FLONASE) 50 MCG/ACT nasal spray USE TWO SPRAY IN EACH NOSTRIL EVERY DAY  16 g  5  . hydrocortisone (ANUSOL-HC) 2.5 % rectal cream Place rectally 2 (two) times daily.  30 g  0  . metoprolol succinate (TOPROL-XL) 50 MG 24 hr tablet Take 1 tablet (50 mg total) by mouth daily. Take with or immediately following a meal.  90 tablet  12  . Multiple Vitamin (MULTIVITAMIN) tablet Take 1 tablet by mouth daily.       Marland Kitchen RESVERATROL PO Take 325 each by mouth.        No current facility-administered medications for this visit.     Past Medical History  Diagnosis  Date  . Allergy   . HOCM (hypertrophic obstructive cardiomyopathy)   . Hypertension     Past Surgical History  Procedure Laterality Date  . Tonsillectomy      History   Social History  . Marital Status: Married    Spouse Name: N/A    Number of Children: N/A  . Years of Education: N/A   Occupational History  . Not on file.   Social History Main Topics  . Smoking status: Former Smoker    Quit date: 05/06/1984  . Smokeless tobacco: Never Used  . Alcohol Use: Yes  . Drug Use: No  . Sexually Active: Not on file   Other Topics Concern  . Not on file   Social History Narrative  . No narrative on file    ROS: no fevers or chills, productive cough, hemoptysis, dysphasia, odynophagia, melena, hematochezia, dysuria, hematuria, rash, seizure activity, orthopnea, PND, pedal edema, claudication. Remaining systems are negative.  Physical Exam: Well-developed well-nourished in no acute distress.  Skin is warm and dry.  HEENT is normal.  Neck is supple.  Chest is clear to auscultation with normal expansion.  Cardiovascular exam is regular rate and rhythm. 2/6 systolic murmur left sternal border that increases with Valsalva. Abdominal exam nontender or distended. No masses palpated. Extremities show no edema. neuro grossly intact  ECG sinus bradycardia at a rate of 53. Left ventricular hypertrophy. Cannot rule out prior  septal infarct.

## 2012-05-27 NOTE — Assessment & Plan Note (Signed)
Continue present blood pressure medications. 

## 2012-05-27 NOTE — Assessment & Plan Note (Addendum)
Patient does note some dyspnea on exertion but his symptoms are unchanged. Continue Toprol. If his symptoms worsen in the future we could refer for myectomy. He has no risk factors for sudden death including no family history of sudden death, no syncope. Repeat exercise treadmill to evaluate systolic blood pressure with exercise and Holter monitor to rule out nonsustained ventricular tachycardia. Patient does not have children. Instructed on the importance of having his siblings screened.

## 2012-05-27 NOTE — Progress Notes (Signed)
George [name] placed a 24 hr monitor

## 2012-05-27 NOTE — Telephone Encounter (Signed)
E-cardio 24 Hour Holter monitor placed on patient.

## 2012-05-27 NOTE — Patient Instructions (Addendum)
Your physician wants you to follow-up in: ONE YEAR WITH DR Shelda Pal will receive a reminder letter in the mail two months in advance. If you don't receive a letter, please call our office to schedule the follow-up appointment.   Your physician has requested that you have an exercise tolerance test. For further information please visit [email or web address] Please also follow instruction sheet, as given.   Your physician has recommended that you wear a 24 HOUR holter monitor. Holter monitors are medical devices that record the heart's electrical activity. Doctors most often use these monitors to diagnose arrhythmias. Arrhythmias are problems with the speed or rhythm of the heartbeat. The monitor is a small, portable device. You can wear one while you do your normal daily activities. This is usually used to diagnose what is causing palpitations/syncope (passing out).

## 2012-06-07 ENCOUNTER — Telehealth: Payer: Self-pay | Admitting: *Deleted

## 2012-06-07 NOTE — Telephone Encounter (Signed)
Left message for pt to call, monitor reviewed by dr [name] shows sinus to sinus brady; PAC's; no NSVT.

## 2012-06-08 NOTE — Telephone Encounter (Signed)
Spoke with pt, aware of monitor results. 

## 2012-06-17 ENCOUNTER — Ambulatory Visit (INDEPENDENT_AMBULATORY_CARE_PROVIDER_SITE_OTHER): Payer: BC Managed Care – PPO | Admitting: Physician Assistant

## 2012-06-17 DIAGNOSIS — I421 Obstructive hypertrophic cardiomyopathy: Secondary | ICD-10-CM

## 2012-06-17 DIAGNOSIS — R0609 Other forms of dyspnea: Secondary | ICD-10-CM

## 2012-06-17 NOTE — Progress Notes (Signed)
Exercise Treadmill Test  Pre-Exercise Testing Evaluation Rhythm: sinus bradycardia  Rate: 45     Test  Exercise Tolerance Test Ordering MD: Olga Millers, MD  Interpreting MD: Tereso Newcomer, PA-C  Unique Test No: 1  Treadmill:  1  Indication for ETT: HOCM  Contraindication to ETT: No   Stress Modality: exercise - treadmill  Cardiac Imaging Performed: non   Protocol: standard Bruce - maximal  Max BP:  184/92  Max MPHR (bpm):  160 85% MPR (bpm):  136  MPHR obtained (bpm):  110 % MPHR obtained:  69  Reached 85% MPHR (min:sec):  n/a Total Exercise Time (min-sec):  9:01  Workload in METS:  10.4 Borg Scale: 16  Reason ETT Terminated:  patient's desire to stop    ST Segment Analysis At Rest: non-specific ST segment slurring With Exercise: borderline ST changes  Other Information Arrhythmia:  No Angina during ETT:  absent (0) Quality of ETT:  non-diagnostic  ETT Interpretation:  Borderline (indeterminate) with non-specific ST changes at submaximal exercise;  Normal BP response to exercise  Comments: Good exercise tolerance. No chest pain. Normal BP response to exercise. Borderline ST depression at submaximal exercise. No exercise induced ventricular arrhythmias.   Recommendations: Patient with HOCM and appropriate BP response to exercise. F/u with Dr. Olga Millers as planned. Signed,  Tereso Newcomer, PA-C   06/17/2012 9:52 AM

## 2012-07-07 ENCOUNTER — Other Ambulatory Visit: Payer: Self-pay | Admitting: Internal Medicine

## 2012-07-11 ENCOUNTER — Other Ambulatory Visit: Payer: Self-pay | Admitting: Internal Medicine

## 2012-07-12 ENCOUNTER — Telehealth: Payer: Self-pay

## 2012-07-12 NOTE — Telephone Encounter (Signed)
Called pharm about refill on [facility]

## 2012-08-30 ENCOUNTER — Other Ambulatory Visit: Payer: Self-pay | Admitting: Internal Medicine

## 2012-11-23 ENCOUNTER — Telehealth: Payer: Self-pay | Admitting: Internal Medicine

## 2012-11-23 MED ORDER — HYDROCORTISONE 2.5 % RE CREA: TOPICAL_CREAM | RECTAL | Status: DC

## 2012-11-23 NOTE — Telephone Encounter (Signed)
Pt has switch to [employer] [address]. Pt needs new rx proctosol 2.5 % cream

## 2012-11-23 NOTE — Telephone Encounter (Signed)
Left detailed message Rx sent to pharmacy as requested. 

## 2012-12-07 ENCOUNTER — Telehealth: Payer: Self-pay | Admitting: Cardiology

## 2012-12-07 ENCOUNTER — Other Ambulatory Visit: Payer: Self-pay | Admitting: *Deleted

## 2012-12-07 MED ORDER — METOPROLOL SUCCINATE ER 50 MG PO TB24: ORAL_TABLET | ORAL | Status: DC

## 2012-12-07 NOTE — Telephone Encounter (Signed)
New Problem:  Pt is calling for a refill. Call pt back

## 2012-12-09 MED ORDER — METOPROLOL SUCCINATE ER 50 MG PO TB24: ORAL_TABLET | ORAL | Status: DC

## 2013-01-06 ENCOUNTER — Other Ambulatory Visit: Payer: Self-pay | Admitting: Internal Medicine

## 2013-03-10 ENCOUNTER — Other Ambulatory Visit: Payer: Self-pay | Admitting: Internal Medicine

## 2013-04-08 ENCOUNTER — Other Ambulatory Visit: Payer: Self-pay | Admitting: Internal Medicine

## 2013-06-02 ENCOUNTER — Ambulatory Visit (INDEPENDENT_AMBULATORY_CARE_PROVIDER_SITE_OTHER): Payer: BC Managed Care – PPO | Admitting: Cardiology

## 2013-06-02 ENCOUNTER — Encounter: Payer: Self-pay | Admitting: Cardiology

## 2013-06-02 ENCOUNTER — Encounter: Payer: Self-pay | Admitting: *Deleted

## 2013-06-02 VITALS — BP 128/82 | HR 50 | Ht 70.0 in | Wt 215.0 lb

## 2013-06-02 DIAGNOSIS — I10 Essential (primary) hypertension: Secondary | ICD-10-CM

## 2013-06-02 DIAGNOSIS — I421 Obstructive hypertrophic cardiomyopathy: Secondary | ICD-10-CM

## 2013-06-02 NOTE — Assessment & Plan Note (Signed)
Blood pressure controlled. Continue present medications. 

## 2013-06-02 NOTE — Patient Instructions (Signed)

## 2013-06-02 NOTE — Progress Notes (Signed)
      HPI: FU HOCM. A previous Myoview in July 2008 showed no ischemia or infarction. Last echocardiogram in May of 2013 showed normal LV function, grade 2 diastolic dysfunction, systolic anterior motion of the mitral valve with a peak LVOT gradient of 62 mm of mercury. There was mild mitral regurgitation and mild left atrial enlargement. Trace aortic insufficiency. Holter monitor in May 2014 showed sinus rhythm with PACs. No nonsustained ventricular tachycardia. Exercise treadmill in June of 2014 showed normal systolic blood pressure response to exercise. I last saw him in May 2014. Since then the patient has dyspnea with more extreme activities but not with routine activities. It is relieved with rest. It is not associated with chest pain. There is no orthopnea, PND. There is no syncope or palpitations. There is no exertional chest pain. Occasional trace pedal edema.    Current Outpatient Prescriptions  Medication Sig Dispense Refill  . DiphenhydrAMINE HCl (ALLERGY MEDICATION PO) Take by mouth.      . fluticasone (FLONASE) 50 MCG/ACT nasal spray PLACE 2 SPRAYS IN EACH NOSTRIL EVERY DAY  16 g  1  . hydrocortisone 2.5 % cream       . metoprolol succinate (TOPROL-XL) 50 MG 24 hr tablet TAKE ONE TABLET BY MOUTH EVERY DAY, TAKE WITH OR IMMEDIATELY FOLLOWING A MEAL  90 tablet  2  . Multiple Vitamin (MULTIVITAMIN) tablet Take 1 tablet by mouth daily.       Marland Kitchen PROCTOSOL HC 2.5 % rectal cream APPLY RECTALLY TWICE DAILY  28.35 g  2  . RESVERATROL PO Take 325 each by mouth.       Marland Kitchen azelastine (ASTELIN) 137 MCG/SPRAY nasal spray Place 1 spray into the nose 2 (two) times daily. Use in each nostril as directed  30 mL  3   No current facility-administered medications for this visit.     Past Medical History  Diagnosis Date  . Allergy   . HOCM (hypertrophic obstructive cardiomyopathy)   . Hypertension     Past Surgical History  Procedure Laterality Date  . Tonsillectomy      History   Social  History  . Marital Status: Married    Spouse Name: N/A    Number of Children: N/A  . Years of Education: N/A   Occupational History  . Not on file.   Social History Main Topics  . Smoking status: Former Smoker    Quit date: 05/06/1984  . Smokeless tobacco: Never Used  . Alcohol Use: Yes  . Drug Use: No  . Sexual Activity: Not on file   Other Topics Concern  . Not on file   Social History Narrative  . No narrative on file    ROS: no fevers or chills, productive cough, hemoptysis, dysphasia, odynophagia, melena, hematochezia, dysuria, hematuria, rash, seizure activity, orthopnea, PND, pedal edema, claudication. Remaining systems are negative.  Physical Exam: Well-developed well-nourished in no acute distress.  Skin is warm and dry.  HEENT is normal.  Neck is supple.  Chest is clear to auscultation with normal expansion.  Cardiovascular exam is regular rate and rhythm. 2/6 systolic murmur left sternal border that increases with Valsalva. Abdominal exam nontender or distended. No masses palpated. Extremities show no edema. neuro grossly intact  ECG Sinus bradycardia at a rate of 50. Left ventricular hypertrophy. Nonspecific ST changes. Cannot rule out prior septal infarct

## 2013-06-02 NOTE — Assessment & Plan Note (Signed)
Patient does note some dyspnea on exertion but his symptoms are unchanged. Continue Toprol. If his symptoms worsen in the future we could refer for consideration of myectomy. He has no risk factors for sudden death including no family history of sudden death, no syncope, Normal systolic blood pressure response on exercise treadmill and no nonsustained ventricular tachycardia on Holter monitor. Repeat echocardiogram. He has noticed mild pedal edema. We can provide a low-dose diuretic in the future to take as needed if his symptoms worsen.

## 2013-06-06 ENCOUNTER — Other Ambulatory Visit: Payer: Self-pay | Admitting: Internal Medicine

## 2013-06-21 ENCOUNTER — Other Ambulatory Visit: Payer: Self-pay | Admitting: Internal Medicine

## 2013-06-23 ENCOUNTER — Ambulatory Visit (HOSPITAL_COMMUNITY): Payer: BC Managed Care – PPO | Attending: Cardiovascular Disease | Admitting: Radiology

## 2013-06-23 DIAGNOSIS — I119 Hypertensive heart disease without heart failure: Secondary | ICD-10-CM | POA: Insufficient documentation

## 2013-06-23 DIAGNOSIS — I421 Obstructive hypertrophic cardiomyopathy: Secondary | ICD-10-CM

## 2013-06-23 DIAGNOSIS — I43 Cardiomyopathy in diseases classified elsewhere: Secondary | ICD-10-CM | POA: Insufficient documentation

## 2013-06-23 NOTE — Progress Notes (Signed)
Echocardiogram performed.  

## 2013-11-02 ENCOUNTER — Ambulatory Visit (INDEPENDENT_AMBULATORY_CARE_PROVIDER_SITE_OTHER): Payer: BC Managed Care – PPO | Admitting: Internal Medicine

## 2013-11-02 ENCOUNTER — Encounter: Payer: Self-pay | Admitting: Internal Medicine

## 2013-11-02 VITALS — BP 130/80 | HR 46 | Temp 98.0°F | Resp 20 | Ht 70.0 in | Wt 213.0 lb

## 2013-11-02 DIAGNOSIS — K644 Residual hemorrhoidal skin tags: Secondary | ICD-10-CM

## 2013-11-02 DIAGNOSIS — K648 Other hemorrhoids: Secondary | ICD-10-CM

## 2013-11-02 DIAGNOSIS — I10 Essential (primary) hypertension: Secondary | ICD-10-CM

## 2013-11-02 DIAGNOSIS — M545 Low back pain, unspecified: Secondary | ICD-10-CM

## 2013-11-02 DIAGNOSIS — I421 Obstructive hypertrophic cardiomyopathy: Secondary | ICD-10-CM

## 2013-11-02 NOTE — Progress Notes (Signed)
Subjective:    Patient ID: George [name], male    DOB: [birth year], 63 y.o.   MRN: [id number]  HPI   63 year old patient who is followed by cardiology for HOCM.  He was doing some yard work recently and has had persistent lower back discomfort.  The right greater than the left.  He has noted some discomfort in the right testicular area as well.  He has a long history of external hemorrhoids and these occasion become quite painful.  He is requesting referral.  He is concerned about a possible prostate infection  Past Medical History  Diagnosis Date  . Allergy   . HOCM (hypertrophic obstructive cardiomyopathy)   . Hypertension     History   Social History  . Marital Status: Married    Spouse Name: N/A    Number of Children: N/A  . Years of Education: N/A   Occupational History  . Not on file.   Social History Main Topics  . Smoking status: Former Smoker    Quit date: 05/06/1984  . Smokeless tobacco: Never Used  . Alcohol Use: Yes  . Drug Use: No  . Sexual Activity: Not on file   Other Topics Concern  . Not on file   Social History Narrative  . No narrative on file    Past Surgical History  Procedure Laterality Date  . Tonsillectomy      Family History  Problem Relation Age of Onset  . Cancer Father     pancreatic    Allergies  Allergen Reactions  . Sulfonamide Derivatives     Current Outpatient Prescriptions on File Prior to Visit  Medication Sig Dispense Refill  . fluticasone (FLONASE) 50 MCG/ACT nasal spray PLACE 2 SPRAYS IN EACH NOSTRIL EVERY DAY  16 g  3  . metoprolol succinate (TOPROL-XL) 50 MG 24 hr tablet TAKE ONE TABLET BY MOUTH EVERY DAY, TAKE WITH OR IMMEDIATELY FOLLOWING A MEAL  90 tablet  2  . Multiple Vitamin (MULTIVITAMIN) tablet Take 1 tablet by mouth daily.       Marland Kitchen PROCTOSOL HC 2.5 % rectal cream APPLY RECTALLY TWICE DAILY  28.35 g  2  . RESVERATROL PO Take 325 each by mouth.        No current facility-administered medications on file  prior to visit.    BP 130/80  Pulse 46  Temp(Src) 98 F (36.7 C) (Oral)  Resp 20  Ht 5\' 10"  (1.778 m)  Wt 213 lb (96.616 kg)  BMI 30.56 kg/m2  SpO2 98%     Review of Systems  Constitutional: Negative for fever, chills, appetite change and fatigue.  HENT: Negative for congestion, dental problem, ear pain, hearing loss, sore throat, tinnitus, trouble swallowing and voice change.   Eyes: Negative for pain, discharge and visual disturbance.  Respiratory: Negative for cough, chest tightness, wheezing and stridor.   Cardiovascular: Negative for chest pain, palpitations and leg swelling.  Gastrointestinal: Negative for nausea, vomiting, abdominal pain, diarrhea, constipation, blood in stool and abdominal distention.  Genitourinary: Positive for testicular pain. Negative for urgency, hematuria, flank pain, discharge, difficulty urinating and genital sores.  Musculoskeletal: Positive for back pain. Negative for arthralgias, gait problem, joint swelling, myalgias and neck stiffness.  Skin: Negative for rash.  Neurological: Negative for dizziness, syncope, speech difficulty, weakness, numbness and headaches.  Hematological: Negative for adenopathy. Does not bruise/bleed easily.  Psychiatric/Behavioral: Negative for behavioral problems and dysphoric mood. The patient is not nervous/anxious.        Objective:  Physical Exam  Constitutional: He is oriented to person, place, and time. He appears well-developed.  HENT:  Head: Normocephalic.  Right Ear: External ear normal.  Left Ear: External ear normal.  Eyes: Conjunctivae and EOM are normal.  Neck: Normal range of motion.  Cardiovascular: Normal rate.   Murmur heard. Grade 3/6 systolic ejection murmur, loudest at the apex  Pulmonary/Chest: Breath sounds normal.  Abdominal: Bowel sounds are normal.  Genitourinary: Prostate normal.  External hemorrhoids noted Prostate exam normal and tender Right testicle normal, without discomfort    Musculoskeletal: Normal range of motion. He exhibits no edema and no tenderness.  Neurological: He is alert and oriented to person, place, and time.  Psychiatric: He has a normal mood and affect. His behavior is normal.          Assessment & Plan:   Low back pain.  We'll treat symptomatically Symptomatic external hemorrhoids.  Will refer to GI Hypertrophic objective cardiomyopathy.  Followup cardiology  CPX one year

## 2013-11-02 NOTE — Progress Notes (Signed)
Pre visit review using our clinic review tool, if applicable. No additional management support is needed unless otherwise documented below in the visit note. 

## 2013-11-02 NOTE — Patient Instructions (Signed)
You  may move around, but avoid painful motions and activities.  Apply heat  to the sore area for 15 to 20 minutes 3 or 4 times daily for the next two to 3 days.  GI referral as discussed

## 2013-11-03 ENCOUNTER — Telehealth: Payer: Self-pay | Admitting: Internal Medicine

## 2013-11-03 MED ORDER — HYDROCORTISONE 2.5 % RE CREA: TOPICAL_CREAM | RECTAL | Status: DC

## 2013-11-03 NOTE — Telephone Encounter (Signed)
Left detailed message Rx sent to pharmacy. 

## 2013-11-03 NOTE — Telephone Encounter (Signed)
emmi mailed  °

## 2013-11-03 NOTE — Telephone Encounter (Signed)
Pt request refill hydrocortisone (PROCTOSOL HC) 2.5 % rectal cream Pt states he asked  Dr Raliegh Ip at his appt 10/22 for refill

## 2013-11-07 ENCOUNTER — Other Ambulatory Visit: Payer: Self-pay | Admitting: Internal Medicine

## 2013-11-14 ENCOUNTER — Encounter: Payer: Self-pay | Admitting: Gastroenterology

## 2013-11-15 ENCOUNTER — Encounter: Payer: Self-pay | Admitting: Gastroenterology

## 2014-01-02 ENCOUNTER — Other Ambulatory Visit: Payer: Self-pay | Admitting: *Deleted

## 2014-01-02 MED ORDER — METOPROLOL SUCCINATE ER 50 MG PO TB24: ORAL_TABLET | ORAL | Status: DC

## 2014-01-03 ENCOUNTER — Encounter: Payer: Self-pay | Admitting: *Deleted

## 2014-01-10 ENCOUNTER — Ambulatory Visit (INDEPENDENT_AMBULATORY_CARE_PROVIDER_SITE_OTHER): Payer: BC Managed Care – PPO | Admitting: Gastroenterology

## 2014-01-10 ENCOUNTER — Encounter: Payer: Self-pay | Admitting: Gastroenterology

## 2014-01-10 VITALS — BP 142/72 | HR 68 | Ht 70.0 in | Wt 216.5 lb

## 2014-01-10 DIAGNOSIS — K5900 Constipation, unspecified: Secondary | ICD-10-CM

## 2014-01-10 DIAGNOSIS — Z83719 Family history of colon polyps, unspecified: Secondary | ICD-10-CM

## 2014-01-10 DIAGNOSIS — Z8371 Family history of colonic polyps: Secondary | ICD-10-CM

## 2014-01-10 DIAGNOSIS — K648 Other hemorrhoids: Secondary | ICD-10-CM

## 2014-01-10 MED ORDER — PEG-KCL-NACL-NASULF-NA ASC-C 100 G PO SOLR: 1.0000 | Freq: Once | ORAL | Status: DC

## 2014-01-10 NOTE — Progress Notes (Signed)
History of Present Illness: This is a 63 year old male, referred by Dr. Burnice Logan, with a family history of colon polyps in his father. He also complains of frequent hemorrhoidal symptoms consisting primarily of discomfort and swelling that responds to hydrocortisone-containing hemorrhoidal creams. He previously underwent colonoscopy at [facility] on 11/29/2006 for higher risk screening. Mild diverticulosis and grade 2 internal hemorrhoids with prolapse were noted. 5 mm polyp was removed from descending colon and pathology revealed polypoid mucosal prolapse/ he has mild intermittent constipation that has responded well to increase fiber and water intake. He has no other gastrointestinal complaints. Denies weight loss, abdominal pain, diarrhea, change in stool caliber, melena, hematochezia, nausea, vomiting, dysphagia, reflux symptoms, chest pain.  Allergies  Allergen Reactions  . Sulfonamide Derivatives    Outpatient Prescriptions Prior to Visit  Medication Sig Dispense Refill  . Cetirizine HCl (ZYRTEC ALLERGY PO) Take 1 tablet by mouth daily.    . fluticasone (FLONASE) 50 MCG/ACT nasal spray PLACE 2 SPRAYS IN EACH NOSTRIL EVERY DAY 16 g 3  . hydrocortisone (PROCTOSOL HC) 2.5 % rectal cream APPLY RECTALLY TWICE DAILY 28.35 g 2  . metoprolol succinate (TOPROL-XL) 50 MG 24 hr tablet TAKE ONE TABLET BY MOUTH EVERY DAY, TAKE WITH OR IMMEDIATELY FOLLOWING A MEAL 90 tablet 1  . Multiple Vitamin (MULTIVITAMIN) tablet Take 1 tablet by mouth daily.     Marland Kitchen RESVERATROL PO Take 325 each by mouth.      No facility-administered medications prior to visit.   Past Medical History  Diagnosis Date  . Allergy   . HOCM (hypertrophic obstructive cardiomyopathy)   . Hypertension   . External hemorrhoids   . Diverticulosis   . Colon polyp     Mucosal prolapse polyp   Past Surgical History  Procedure Laterality Date  . Tonsillectomy     History   Social History  . Marital Status: Married     Spouse Name: N/A    Number of Children: 0  . Years of Education: N/A   Occupational History  . Facilities manager    Social History Main Topics  . Smoking status: Former Smoker    Quit date: 05/06/1984  . Smokeless tobacco: Never Used  . Alcohol Use: 0.0 oz/week    0 Not specified per week     Comment: Occassionally  . Drug Use: No  . Sexual Activity: None   Other Topics Concern  . None   Social History Narrative   Family History  Problem Relation Age of Onset  . Pancreatic cancer Father   . Colon polyps Father     Benign  . Colon cancer Neg Hx   . Diabetes Neg Hx   . Kidney disease Neg Hx   . Gallbladder disease Neg Hx   . Esophageal cancer Neg Hx      Review of Systems: Pertinent positive and negative review of systems were noted in the above HPI section. All other review of systems were otherwise negative.   Physical Exam: General: Well developed , well nourished, no acute distress Head: Normocephalic and atraumatic Eyes:  sclerae anicteric, EOMI Ears: Normal auditory acuity Mouth: No deformity or lesions Neck: Supple, no masses or thyromegaly Lungs: Clear throughout to auscultation Heart: Regular rate and rhythm; no murmurs, rubs or bruits Abdomen: Soft, non tender and non distended. No masses, hepatosplenomegaly or hernias noted. Normal Bowel sounds Rectal: Deferred to colonoscopy Musculoskeletal: Symmetrical with no gross deformities  Skin: No lesions on visible extremities Pulses:  Normal  pulses noted Extremities: No clubbing, cyanosis, edema or deformities noted Neurological: Alert oriented x 4, grossly nonfocal Cervical Nodes:  No significant cervical adenopathy Inguinal Nodes: No significant inguinal adenopathy Psychological:  Alert and cooperative. Normal mood and affect  Assessment and Recommendations:  1. Family history of colon polyps in his father. Schedule colonoscopy for higher risk colon cancer screening. The risks, benefits, and  alternatives to colonoscopy with possible biopsy, possible destruction of internal hemorrhoids and possible polypectomy were discussed with the patient and they consent to proceed.   2. Presumed symptomatic hemorrhoids. Internal hemorrhoids with prolapse noted at prior colonoscopy. Further evaluation at colonoscopy. Standard rectal care instructions and Proctosol HC cream twice daily as needed. Maintain a high-fiber diet with adequate daily water intake.

## 2014-01-10 NOTE — Patient Instructions (Signed)
You have been scheduled for a colonoscopy. Please follow written instructions given to you at your visit today.  Please pick up your prep kit at the pharmacy within the next 1-3 days. If you use inhalers (even only as needed), please bring them with you on the day of your procedure. Your physician has requested that you go to www.startemmi.com and enter the access code given to you at your visit today. This web site gives a general overview about your procedure. However, you should still follow specific instructions given to you by our office regarding your preparation for the procedure.  RECTAL CARE INSTRUCTIONS:  1. Sitz Baths twice a day for 10 minutes each. 2. Thoroughly clean and dry the rectum. 3. Put Tucks pad against the rectum at night. 4. Clean the rectum with Balenol lotion after each bowel movement.  Thank you for choosing me and [facility].  Pricilla Riffle. Dagoberto Ligas., MD., [facility] Reg[facility]

## 2014-02-19 ENCOUNTER — Encounter: Payer: BC Managed Care – PPO | Admitting: Gastroenterology

## 2014-03-19 ENCOUNTER — Ambulatory Visit (AMBULATORY_SURGERY_CENTER): Payer: BLUE CROSS/BLUE SHIELD | Admitting: Gastroenterology

## 2014-03-19 ENCOUNTER — Encounter: Payer: Self-pay | Admitting: Gastroenterology

## 2014-03-19 VITALS — BP 117/66 | HR 47 | Temp 97.6°F | Resp 18 | Ht 70.0 in | Wt 216.0 lb

## 2014-03-19 DIAGNOSIS — D125 Benign neoplasm of sigmoid colon: Secondary | ICD-10-CM

## 2014-03-19 DIAGNOSIS — D128 Benign neoplasm of rectum: Secondary | ICD-10-CM

## 2014-03-19 DIAGNOSIS — Z8371 Family history of colonic polyps: Secondary | ICD-10-CM

## 2014-03-19 DIAGNOSIS — K635 Polyp of colon: Secondary | ICD-10-CM

## 2014-03-19 DIAGNOSIS — K648 Other hemorrhoids: Secondary | ICD-10-CM

## 2014-03-19 DIAGNOSIS — Z1211 Encounter for screening for malignant neoplasm of colon: Secondary | ICD-10-CM

## 2014-03-19 DIAGNOSIS — K621 Rectal polyp: Secondary | ICD-10-CM

## 2014-03-19 DIAGNOSIS — D124 Benign neoplasm of descending colon: Secondary | ICD-10-CM

## 2014-03-19 DIAGNOSIS — D123 Benign neoplasm of transverse colon: Secondary | ICD-10-CM

## 2014-03-19 DIAGNOSIS — D129 Benign neoplasm of anus and anal canal: Secondary | ICD-10-CM

## 2014-03-19 MED ORDER — SODIUM CHLORIDE 0.9 % IV SOLN: 500.0000 mL | INTRAVENOUS | Status: DC

## 2014-03-19 NOTE — Progress Notes (Signed)
To recovery. Report to Doyle Askew, Therapist, sports. VSS

## 2014-03-19 NOTE — Patient Instructions (Addendum)
YOU HAD AN ENDOSCOPIC PROCEDURE TODAY AT [facility]:   Refer to the procedure report that was given to you for any specific questions about what was found during the examination.  If the procedure report does not answer your questions, please call your gastroenterologist to clarify.  If you requested that your care partner not be given the details of your procedure findings, then the procedure report has been included in a sealed envelope for you to review at your convenience later.  YOU SHOULD EXPECT: Some feelings of bloating in the abdomen. Passage of more gas than usual.  Walking can help get rid of the air that was put into your GI tract during the procedure and reduce the bloating. If you had a lower endoscopy (such as a colonoscopy or flexible sigmoidoscopy) you may notice spotting of blood in your stool or on the toilet paper. If you underwent a bowel prep for your procedure, you may not have a normal bowel movement for a few days.  Please Note:  You might notice some irritation and congestion in your nose or some drainage.  This is from the oxygen used during your procedure.  There is no need for concern and it should clear up in a day or so.  SYMPTOMS TO REPORT IMMEDIATELY:   Following lower endoscopy (colonoscopy or flexible sigmoidoscopy):  Excessive amounts of blood in the stool  Significant tenderness or worsening of abdominal pains  Swelling of the abdomen that is new, acute  Fever of 100F or higher  For urgent or emergent issues, a gastroenterologist can be reached at any hour by calling [phone].   DIET: Your first meal following the procedure should be a small meal and then it is ok to progress to your normal diet. Heavy or fried foods are harder to digest and may make you feel nauseous or bloated.  Likewise, meals heavy in dairy and vegetables can increase bloating.  Drink plenty of fluids but you should avoid alcoholic beverages for 24  hours.  ACTIVITY:  You should plan to take it easy for the rest of today and you should NOT DRIVE or use heavy machinery until tomorrow (because of the sedation medicines used during the test).    FOLLOW UP: Our staff will call the number listed on your records the next business day following your procedure to check on you and address any questions or concerns that you may have regarding the information given to you following your procedure. If we do not reach you, we will leave a message.  However, if you are feeling well and you are not experiencing any problems, there is no need to return our call.  We will assume that you have returned to your regular daily activities without incident.  If any biopsies were taken you will be contacted by phone or by letter within the next 1-3 weeks.  Please call us at [phone] if you have not heard about the biopsies in 3 weeks.    SIGNATURES/CONFIDENTIALITY: You and/or your care partner have signed paperwork which will be entered into your electronic medical record.  These signatures attest to the fact that that the information above on your After Visit Summary has been reviewed and is understood.  Full responsibility of the confidentiality of this discharge information lies with you and/or your care-partner.  Recommendations Pathology results pending; next colonoscopy in 5 years. Discharge instructions given to patient and/or care partner. Polyp, diverticulosis, hemorrhoid, and high fiber diet handouts  provided.  Hold all NSAID's (aspirin, aspirin products, and anti inflammatory drugs) for 2 weeks.

## 2014-03-19 NOTE — Progress Notes (Signed)
Called to room to assist during endoscopic procedure.  Patient ID and intended procedure confirmed with present staff. Received instructions for my participation in the procedure from the performing physician.  

## 2014-03-19 NOTE — Op Note (Signed)
[place]  [address] [place], [address]   COLONOSCOPY PROCEDURE REPORT PATIENT: George [name], George [name]  MR#: [id number] BIRTHDATE: 15-Aug-1950 , 36  yrs. old GENDER: male ENDOSCOPIST: Ladene Artist, MD, [facility] PROCEDURE DATE:  03/19/2014 PROCEDURE:   Colonoscopy, diagnostic, Colonoscopy with biopsy, Colonoscopy with snare polypectomy, and Hemorrhoidectomy via sclerosing First Screening Colonoscopy - Avg.  risk and is 50 yrs.  old or older - No.  Prior Negative Screening - Now for repeat screening. N/A  History of Adenoma - Now for follow-up colonoscopy & has been > or = to 3 yrs.  N/A  Polyps Removed Today? Yes. ASA CLASS:   Class III INDICATIONS:Evaluation of symptomatic hemorrhoids and FH Colon Adenoma. MEDICATIONS: Monitored anesthesia care and Propofol 300 mg IV DESCRIPTION OF PROCEDURE:   After the risks benefits and alternatives of the procedure were thoroughly explained, informed consent was obtained.  The digital rectal exam revealed no abnormalities of the rectum.   The LB [id number]  endoscope was introduced through the anus and advanced to the cecum, which was identified by both the appendix and ileocecal valve. No adverse events experienced.   The quality of the prep was good.  The instrument was then slowly withdrawn as the colon was fully examined.  COLON FINDINGS: A sessile polyp measuring 6 mm in size was found in the transverse colon.  A polypectomy was performed with a cold snare.  The resection was complete, the polyp tissue was completely retrieved and sent to histology. Two sessile polyps measuring 4-5 mm in size were found in the descending colon.  Polypectomies were performed with a cold snare and with cold forceps.  The resection was complete, the polyp tissue was completely retrieved and sent to histology.   Four sessile polyps measuring 5-8 mm in size were found in the sigmoid colon.  Polypectomies were performed with a cold snare  and using snare cautery.  The resection was complete, the polyp tissue was completely retrieved and sent to histology.  A sessile polyp measuring 4 mm in size was found in the rectum.  A polypectomy was performed with cold forceps.  The resection was complete, the polyp tissue was completely retrieved and sent to histology.  There was mild diverticulosis noted in the sigmoid colon.   The examination was otherwise normal.  Retroflexed views revealed internal Grade II hemorrhoids. 2.5 cc of 23.4% saline injected into internal hemorrhoids well above the dentate line. The time to cecum = 1:34     Withdrawal time = 18:26      The scope was withdrawn and the procedure completed. COMPLICATIONS: There were no immediate complications. ENDOSCOPIC IMPRESSION: 1.   Sessile polyp in the transverse colon; polypectomy performed with a cold snare 2.   Two sessile polyps in the descending colon; polypectomies performed with a cold snare and with cold forceps 3.   Four sessile polyps in the sigmoid colon; polypectomies performed with a cold snare and using snare cautery 4.   Sessile polyp in the rectum; polypectomy performed with cold forceps 5.   Mild diverticulosis was noted in the sigmoid colon 6.   Grade Il internal hemorrhoids RECOMMENDATIONS: 1.  Await pathology results 2.  Hold Aspirin and all other NSAIDS for 2 weeks. 3.  High fiber diet with liberal fluid intake. 4.  Repeat Colonoscopy in 5 years. eSigned:  Ladene Artist, MD, [facility] 03/19/2014 9:24 AM    PATIENT NAME:  George [name], George [name] MR#: [id number]

## 2014-03-20 ENCOUNTER — Telehealth: Payer: Self-pay

## 2014-03-20 NOTE — Telephone Encounter (Signed)
  Follow up Call-  Call back number 03/19/2014  Post procedure Call Back phone  # [phone]  Permission to leave phone message Yes     Patient questions:  Do you have a fever, pain , or abdominal swelling? No. Pain Score  0 *  Have you tolerated food without any problems? Yes.    Have you been able to return to your normal activities? Yes.    Do you have any questions about your discharge instructions: Diet   No. Medications  No. Follow up visit  No.  Do you have questions or concerns about your Care? No.  Actions: * If pain score is 4 or above: No action needed, pain <4.

## 2014-03-22 ENCOUNTER — Other Ambulatory Visit: Payer: Self-pay | Admitting: Internal Medicine

## 2014-03-26 ENCOUNTER — Encounter: Payer: Self-pay | Admitting: Gastroenterology

## 2014-03-30 ENCOUNTER — Telehealth: Payer: Self-pay | Admitting: Gastroenterology

## 2014-03-30 NOTE — Telephone Encounter (Signed)
Patient has a head cold and wants to take an OTC medication that contains acetaminophen. He was instructed to avoid all NSAIDS for 2 weeks.  He is advised that it is fine to take acetaminophen for his head cold.  He will call back for any additional questions or concerns

## 2014-06-07 ENCOUNTER — Other Ambulatory Visit: Payer: Self-pay | Admitting: Internal Medicine

## 2014-06-19 ENCOUNTER — Telehealth: Payer: Self-pay | Admitting: Internal Medicine

## 2014-06-19 ENCOUNTER — Ambulatory Visit (INDEPENDENT_AMBULATORY_CARE_PROVIDER_SITE_OTHER): Payer: BLUE CROSS/BLUE SHIELD | Admitting: Family

## 2014-06-19 ENCOUNTER — Encounter: Payer: Self-pay | Admitting: Family

## 2014-06-19 VITALS — BP 128/88 | HR 58 | Temp 98.5°F | Resp 18 | Ht 70.0 in | Wt 220.0 lb

## 2014-06-19 DIAGNOSIS — T63451A Toxic effect of venom of hornets, accidental (unintentional), initial encounter: Secondary | ICD-10-CM | POA: Diagnosis not present

## 2014-06-19 DIAGNOSIS — T63441A Toxic effect of venom of bees, accidental (unintentional), initial encounter: Secondary | ICD-10-CM

## 2014-06-19 DIAGNOSIS — T63461A Toxic effect of venom of wasps, accidental (unintentional), initial encounter: Secondary | ICD-10-CM | POA: Diagnosis not present

## 2014-06-19 MED ORDER — PREDNISONE 10 MG PO TABS: ORAL_TABLET | ORAL | Status: DC

## 2014-06-19 MED ORDER — CEPHALEXIN 500 MG PO CAPS: 500.0000 mg | ORAL_CAPSULE | Freq: Two times a day (BID) | ORAL | Status: DC

## 2014-06-19 MED ORDER — METHYLPREDNISOLONE ACETATE 80 MG/ML IJ SUSP
80.0000 mg | Freq: Once | INTRAMUSCULAR | Status: AC
  Administered 2014-06-19: 80 mg via INTRAMUSCULAR

## 2014-06-19 NOTE — Patient Instructions (Signed)
Thank you for choosing [facility].  Summary/Instructions:  Your prescription(s) have been submitted to your pharmacy or been printed and provided for you. Please take as directed and contact our office if you believe you are having problem(s) with the medication(s) or have any questions.  If your symptoms worsen or fail to improve, please contact our office for further instruction, or in case of emergency go directly to the emergency room at the closest medical facility.    6 Day Prednisone Taper Instructions:   Day 1: Two tablets before breakfast, one after lunch, one after dinner, and two at bedtime.  Day 2: One tablet before breakfast, one after lunch, one after dinner, and two at bedtime Day 3: One tablet before breakfast, one after lunch, one after dinner, and one at bedtime Day 4: One tablet before breakfast, one after lunch, and one at bedtime Day 5: One tablet before breakfast and one at bedtime Day 6: One tablet before breakfast  Bee, Wasp, or Hornet Sting Your caregiver has diagnosed you as having an insect sting. An insect sting appears as a red lump in the skin that sometimes has a tiny hole in the center, or it may have a stinger in the center of the wound. The most common stings are from wasps, hornets and bees. Individuals have different reactions to insect stings.  A normal reaction may cause pain, swelling, and redness around the sting site.  A localized allergic reaction may cause swelling and redness that extends beyond the sting site.  A large local reaction may continue to develop over the next 12 to 36 hours.  On occasion, the reactions can be severe (anaphylactic reaction). An anaphylactic reaction may cause wheezing; difficulty breathing; chest pain; fainting; raised, itchy, red patches on the skin; a sick feeling to your stomach (nausea); vomiting; cramping; or diarrhea. If you have had an anaphylactic reaction to an insect sting in the past, you are more  likely to have one again. HOME CARE INSTRUCTIONS   With bee stings, a small sac of poison is left in the wound. Brushing across this with something such as a credit card, or anything similar, will help remove this and decrease the amount of the reaction. This same procedure will not help a wasp sting as they do not leave behind a stinger and poison sac.  Apply a cold compress for 10 to 20 minutes every hour for 1 to 2 days, depending on severity, to reduce swelling and itching.  To lessen pain, a paste made of water and baking soda may be rubbed on the bite or sting and left on for 5 minutes.  To relieve itching and swelling, you may use take medication or apply medicated creams or lotions as directed.  Only take over-the-counter or prescription medicines for pain, discomfort, or fever as directed by your caregiver.  Wash the sting site daily with soap and water. Apply antibiotic ointment on the sting site as directed.  If you suffered a severe reaction:  If you did not require hospitalization, an adult will need to stay with you for 24 hours in case the symptoms return.  You may need to wear a medical bracelet or necklace stating the allergy.  You and your family need to learn when and how to use an anaphylaxis kit or epinephrine injection.  If you have had a severe reaction before, always carry your anaphylaxis kit with you. SEEK MEDICAL CARE IF:   None of the above helps within 2 to  3 days.  The area becomes red, warm, tender, and swollen beyond the area of the bite or sting.  You have an oral temperature above 102 F (38.9 C). SEEK IMMEDIATE MEDICAL CARE IF:  You have symptoms of an allergic reaction which are:  Wheezing.  Difficulty breathing.  Chest pain.  Lightheadedness or fainting.  Itchy, raised, red patches on the skin.  Nausea, vomiting, cramping or diarrhea. ANY OF THESE SYMPTOMS MAY REPRESENT A SERIOUS PROBLEM THAT IS AN EMERGENCY. Do not wait to see if the  symptoms will go away. Get medical help right away. Call your local emergency services (911 in [place] DO NOT drive yourself to the hospital. MAKE SURE YOU:   Understand these instructions.  Will watch your condition.  Will get help right away if you are not doing well or get worse. Document Released: 12/29/2004 Document Revised: 03/23/2011 Document Reviewed: 06/15/2009 [facility] Patient Information 2015 [place], [place]. This information is not intended to replace advice given to you by your health care provider. Make sure you discuss any questions you have with your health care provider.

## 2014-06-19 NOTE — Telephone Encounter (Signed)
Patient Name: HAI GRABE  DOB: 1950-09-01    Initial Comment Caller states he had a bad wasp sting on Saturday it is not swollen and now is discolored.    Nurse Assessment  Nurse: Leilani Merl, RN, Heather Date/Time (Eastern Time): 06/19/2014 8:35:46 AM  Confirm and document reason for call. If symptomatic, describe symptoms. ---Caller states he had a bad wasp sting on Saturday it is swollen and now is discolored.  Has the patient traveled out of the country within the last 30 days? ---Not Applicable  Does the patient require triage? ---Yes  Related visit to physician within the last 2 weeks? ---No  Does the PT have any chronic conditions? (i.e. diabetes, asthma, etc.) ---Yes  List chronic conditions. ---heart probs     Guidelines    Guideline Title Affirmed Question Affirmed Notes  Bee or Yellow Jacket Sting [1] Red streak or red line AND [2] length > 2 inches (5 cm)    Final Disposition User   See Physician within 4 Hours (or PCP triage) Standifer, RN, Nira Conn    Comments  Appt made with Dr. Elna Breslow at the [place] office at 11am.

## 2014-06-19 NOTE — Progress Notes (Signed)
Pre visit review using our clinic review tool, if applicable. No additional management support is needed unless otherwise documented below in the visit note. 

## 2014-06-19 NOTE — Progress Notes (Signed)
   Subjective:    Patient ID: George [name], male    DOB: [birth year], 64 y.o.   MRN: [id number]  Chief Complaint  Patient presents with  . Insect Bite    got a sting on his left leg on saturday, leg is red and swollen, thinks it was a black wasp    HPI:  George [name] is a 64 y.o. male with a PMH of hypertension, hypertrophic obstructive cardiomyopathy, and dyspnea who presents today in acute office visit.  This is a new problem. Associated symptom of a insect bite/sting has been going on for approximately 3 days when he was walking in the park. Notes that a bee flew up his shorts and stung his left thigh. Currently experiencing redness and swelling which has worsened since initial sting. Notes that there is an associated discomfort. Notes some itchiness. Modifying factors include neosporin which has helped. Indicates that it has slowly gotten better.    Allergies  Allergen Reactions  . Sulfonamide Derivatives     Current Outpatient Prescriptions on File Prior to Visit  Medication Sig Dispense Refill  . Cetirizine HCl (ZYRTEC ALLERGY PO) Take 1 tablet by mouth daily.    . fluticasone (FLONASE) 50 MCG/ACT nasal spray USSE 2 SPRAYS IN EACH NOSTRIL EVERY DAY 16 g 3  . hydrocortisone (PROCTOSOL HC) 2.5 % rectal cream APPLY RECTALLY TWICE DAILY 28.35 g 2  . metoprolol succinate (TOPROL-XL) 50 MG 24 hr tablet TAKE ONE TABLET BY MOUTH EVERY DAY, TAKE WITH OR IMMEDIATELY FOLLOWING A MEAL 90 tablet 1  . Multiple Vitamin (MULTIVITAMIN) tablet Take 1 tablet by mouth daily.     Marland Kitchen PROCTOSOL HC 2.5 % rectal cream APPLY RECTALLY TWICE DAILY 28.35 g 2  . RESVERATROL PO Take 325 each by mouth.      No current facility-administered medications on file prior to visit.    Review of Systems  Constitutional: Negative for fever and chills.  Skin: Positive for rash and wound.      Objective:    BP 128/88 mmHg  Pulse 58  Temp(Src) 98.5 F (36.9 C) (Oral)  Resp 18  Ht 5\' 10"  (1.778 m)  Wt 220 lb  (99.791 kg)  BMI 31.57 kg/m2  SpO2 98% Nursing note and vital signs reviewed.  Physical Exam  Constitutional: He is oriented to person, place, and time. He appears well-developed and well-nourished. No distress.  Cardiovascular: Normal rate, regular rhythm, normal heart sounds and intact distal pulses.   Pulmonary/Chest: Effort normal and breath sounds normal.  Neurological: He is alert and oriented to person, place, and time.  Skin: Skin is warm and dry.     Psychiatric: He has a normal mood and affect. His behavior is normal. Judgment and thought content normal.       Assessment & Plan:   Problem List Items Addressed This Visit      Other   Sting from hornet, wasp, or bee - Primary    Symptoms and exam consistent with sustained from an insect. In office Depo-Medrol injection given. Start prednisone taper for inflammation. Given warmth will treat with Keflex for infection. Follow up if symptoms worsen or fail to improve.       Relevant Medications   cephALEXin (KEFLEX) 500 MG capsule   predniSONE (DELTASONE) 10 MG tablet

## 2014-06-19 NOTE — Assessment & Plan Note (Signed)
Symptoms and exam consistent with sustained from an insect. In office Depo-Medrol injection given. Start prednisone taper for inflammation. Given warmth will treat with Keflex for infection. Follow up if symptoms worsen or fail to improve.

## 2014-06-28 ENCOUNTER — Other Ambulatory Visit: Payer: Self-pay | Admitting: Cardiology

## 2014-06-28 NOTE — Telephone Encounter (Signed)
Rx(s) sent to pharmacy electronically.  

## 2014-07-10 NOTE — Progress Notes (Signed)
      HPI: FU HOCM. A previous Myoview in July 2008 showed no ischemia or infarction. Holter monitor in May 2014 showed sinus rhythm with PACs. No nonsustained ventricular tachycardia. Exercise treadmill in June of 2014 showed normal systolic blood pressure response to exercise. Last echo 6/15 showed normal LV function; SAM; LVOT gradient of 3-4 m/s, mild LAE and mild MR. Since last seen, the patient has dyspnea with more extreme activities but not with routine activities. It is relieved with rest. It is not associated with chest pain. There is no orthopnea, PND or pedal edema. There is no syncope or palpitations. There is no exertional chest pain.   Current Outpatient Prescriptions  Medication Sig Dispense Refill  . Cetirizine HCl (ZYRTEC ALLERGY PO) Take 1 tablet by mouth daily.    . fluticasone (FLONASE) 50 MCG/ACT nasal spray USSE 2 SPRAYS IN EACH NOSTRIL EVERY DAY 16 g 3  . hydrocortisone (PROCTOSOL HC) 2.5 % rectal cream APPLY RECTALLY TWICE DAILY 28.35 g 2  . metoprolol succinate (TOPROL-XL) 50 MG 24 hr tablet TAKE ONE TABLET BY MOUTH EVERY DAY, TAKE WITH OR IMMEDIATELY FOLLOWING A MEAL 90 tablet 2  . Multiple Vitamin (MULTIVITAMIN) tablet Take 1 tablet by mouth daily.     Marland Kitchen RESVERATROL PO Take 325 each by mouth.      No current facility-administered medications for this visit.     Past Medical History  Diagnosis Date  . Allergy   . HOCM (hypertrophic obstructive cardiomyopathy)   . Hypertension   . External hemorrhoids   . Diverticulosis   . Colon polyp     Mucosal prolapse polyp    Past Surgical History  Procedure Laterality Date  . Tonsillectomy    . Colonoscopy      History   Social History  . Marital Status: Married    Spouse Name: N/A  . Number of Children: 0  . Years of Education: N/A   Occupational History  . Facilities manager    Social History Main Topics  . Smoking status: Former Smoker    Quit date: 05/06/1984  . Smokeless tobacco: Never Used  .  Alcohol Use: 0.0 oz/week    0 Standard drinks or equivalent per week     Comment: Occassionally  . Drug Use: No  . Sexual Activity: Not on file   Other Topics Concern  . Not on file   Social History Narrative    ROS: no fevers or chills, productive cough, hemoptysis, dysphasia, odynophagia, melena, hematochezia, dysuria, hematuria, rash, seizure activity, orthopnea, PND, pedal edema, claudication. Remaining systems are negative.  Physical Exam: Well-developed well-nourished in no acute distress.  Skin is warm and dry.  HEENT is normal.  Neck is supple.  Chest is clear to auscultation with normal expansion.  Cardiovascular exam is regular rate and rhythm. 2/6 systolic murmur left sternal border that increases with Valsalva. Abdominal exam nontender or distended. No masses palpated. Extremities show no edema. neuro grossly intact  ECG sinus rhythm at a rate of 58, left ventricular hypertrophy with repolarization abnormality, cannot rule out prior septal infarct.

## 2014-07-12 ENCOUNTER — Encounter: Payer: Self-pay | Admitting: *Deleted

## 2014-07-12 ENCOUNTER — Encounter: Payer: Self-pay | Admitting: Cardiology

## 2014-07-12 ENCOUNTER — Ambulatory Visit (INDEPENDENT_AMBULATORY_CARE_PROVIDER_SITE_OTHER): Payer: BLUE CROSS/BLUE SHIELD | Admitting: Cardiology

## 2014-07-12 VITALS — BP 122/68 | HR 58 | Ht 68.0 in | Wt 214.0 lb

## 2014-07-12 DIAGNOSIS — I421 Obstructive hypertrophic cardiomyopathy: Secondary | ICD-10-CM | POA: Diagnosis not present

## 2014-07-12 DIAGNOSIS — I10 Essential (primary) hypertension: Secondary | ICD-10-CM

## 2014-07-12 NOTE — Assessment & Plan Note (Signed)
Patient does note some dyspnea on exertion but his symptoms are unchanged. Continue Toprol. If his symptoms worsen in the future we could refer for consideration of myectomy. He has no risk factors for sudden death including no family history of sudden death, no syncope. Repeat echocardiogram. Repeat all to monitor to screen for nonsustained ventricular tachycardia and exercise treadmill to evaluate systolic blood pressure with exercise. Patient has instructed his siblings previously to be screened. He has no children.

## 2014-07-12 NOTE — Assessment & Plan Note (Signed)
Blood pressure controlled. Continue present medications. 

## 2014-07-12 NOTE — Patient Instructions (Signed)
Your physician wants you to follow-up in: [place] will receive a reminder letter in the mail two months in advance. If you don't receive a letter, please call our office to schedule the follow-up appointment.   Your physician has requested that you have an echocardiogram. Echocardiography is a painless test that uses sound waves to create images of your heart. It provides your doctor with information about the size and shape of your heart and how well your heart's chambers and valves are working. This procedure takes approximately one hour. There are no restrictions for this procedure.SCHEDULE AT [place]  Your physician has recommended that you wear a 24 HOUR holter monitor. Holter monitors are medical devices that record the heart's electrical activity. Doctors most often use these monitors to diagnose arrhythmias. Arrhythmias are problems with the speed or rhythm of the heartbeat. The monitor is a small, portable device. You can wear one while you do your normal daily activities. This is usually used to diagnose what is causing palpitations/syncope (passing out).SCHEDULE AT [place]  Your physician has requested that you have an exercise tolerance test. For further information please visit [email or web address]. Please also follow instruction sheet, as given.   Exercise Stress Electrocardiogram An exercise stress electrocardiogram is a test to check how blood flows to your heart. It is done to find areas of poor blood flow. You will need to walk on a treadmill for this test. The electrocardiogram will record your heartbeat when you are at rest and when you are exercising. BEFORE THE PROCEDURE  Do not have drinks with caffeine or foods with caffeine for 24 hours before the test, or as told by your doctor. This includes coffee, tea (even decaf tea), sodas, chocolate, and cocoa.  Follow your doctor's instructions about eating and drinking before the  test.  Ask your doctor what medicines you should or should not take before the test. Take your medicines with water unless told by your doctor not to.  If you use an inhaler, bring it with you to the test.  Bring a snack to eat after the test.  Do not  smoke for 4 hours before the test.  Do not put lotions, powders, creams, or oils on your chest before the test.  Wear comfortable shoes and clothing. PROCEDURE  You will have patches put on your chest. Small areas of your chest may need to be shaved. Wires will be connected to the patches.  Your heart rate will be watched while you are resting and while you are exercising.  You will walk on the treadmill. The treadmill will slowly get faster to raise your heart rate.  The test will take about 1-2 hours. AFTER THE PROCEDURE  Your heart rate and blood pressure will be watched after the test.  You may return to your normal diet, activities, and medicines or as told by your doctor. Document Released: 06/17/2007 Document Revised: 05/15/2013 Document Reviewed: 09/05/2012 [facility] Patient Information 2015 [place], [place]. This information is not intended to replace advice given to you by your health care provider. Make sure you discuss any questions you have with your health care provider.

## 2014-07-18 ENCOUNTER — Other Ambulatory Visit: Payer: Self-pay | Admitting: Cardiology

## 2014-07-18 DIAGNOSIS — I472 Ventricular tachycardia: Secondary | ICD-10-CM

## 2014-07-18 DIAGNOSIS — I491 Atrial premature depolarization: Secondary | ICD-10-CM

## 2014-07-18 DIAGNOSIS — I421 Obstructive hypertrophic cardiomyopathy: Secondary | ICD-10-CM

## 2014-07-18 DIAGNOSIS — I4729 Other ventricular tachycardia: Secondary | ICD-10-CM

## 2014-07-19 ENCOUNTER — Ambulatory Visit (HOSPITAL_COMMUNITY): Payer: BLUE CROSS/BLUE SHIELD | Attending: Cardiology

## 2014-07-19 ENCOUNTER — Ambulatory Visit (INDEPENDENT_AMBULATORY_CARE_PROVIDER_SITE_OTHER): Payer: BLUE CROSS/BLUE SHIELD

## 2014-07-19 ENCOUNTER — Other Ambulatory Visit: Payer: Self-pay

## 2014-07-19 ENCOUNTER — Other Ambulatory Visit (HOSPITAL_COMMUNITY): Payer: BLUE CROSS/BLUE SHIELD

## 2014-07-19 DIAGNOSIS — I422 Other hypertrophic cardiomyopathy: Secondary | ICD-10-CM | POA: Insufficient documentation

## 2014-07-19 DIAGNOSIS — I491 Atrial premature depolarization: Secondary | ICD-10-CM | POA: Diagnosis not present

## 2014-07-19 DIAGNOSIS — I421 Obstructive hypertrophic cardiomyopathy: Secondary | ICD-10-CM

## 2014-07-19 DIAGNOSIS — I472 Ventricular tachycardia: Secondary | ICD-10-CM

## 2014-07-19 DIAGNOSIS — Z87891 Personal history of nicotine dependence: Secondary | ICD-10-CM | POA: Diagnosis not present

## 2014-07-19 DIAGNOSIS — I4729 Other ventricular tachycardia: Secondary | ICD-10-CM

## 2014-07-19 DIAGNOSIS — I10 Essential (primary) hypertension: Secondary | ICD-10-CM | POA: Insufficient documentation

## 2014-08-02 ENCOUNTER — Telehealth: Payer: Self-pay | Admitting: *Deleted

## 2014-08-02 NOTE — Telephone Encounter (Signed)
Holter shows sinus with rare pac; no NSVT; continue present meds    Kirk Ruths     Left message for pt to call

## 2014-08-03 NOTE — Telephone Encounter (Signed)
Returning your call. °

## 2014-08-06 NOTE — Telephone Encounter (Signed)
Left message of results for pt  

## 2014-08-08 ENCOUNTER — Telehealth (HOSPITAL_COMMUNITY): Payer: Self-pay

## 2014-08-08 NOTE — Telephone Encounter (Signed)
Encounter complete. 

## 2014-08-09 ENCOUNTER — Telehealth (HOSPITAL_COMMUNITY): Payer: Self-pay

## 2014-08-09 NOTE — Telephone Encounter (Signed)
Encounter complete. 

## 2014-08-10 ENCOUNTER — Ambulatory Visit (HOSPITAL_COMMUNITY)
Admission: RE | Admit: 2014-08-10 | Discharge: 2014-08-10 | Disposition: A | Discharge status: Home / Self Care | Payer: BLUE CROSS/BLUE SHIELD | Source: Ambulatory Visit | Attending: Cardiology | Admitting: Cardiology

## 2014-08-10 DIAGNOSIS — I421 Obstructive hypertrophic cardiomyopathy: Secondary | ICD-10-CM | POA: Insufficient documentation

## 2014-08-13 LAB — EXERCISE TOLERANCE TEST
CHL CUP MPHR: 158 {beats}/min
CSEPEW: 10.1 METS
Exercise duration (min): 9 min
Peak HR: 125 {beats}/min
Percent HR: 79 %
RPE: 16
Rest HR: 54 {beats}/min

## 2014-08-15 ENCOUNTER — Other Ambulatory Visit: Payer: Self-pay | Admitting: Internal Medicine

## 2014-08-24 ENCOUNTER — Ambulatory Visit: Payer: BLUE CROSS/BLUE SHIELD | Admitting: Physician Assistant

## 2014-09-19 ENCOUNTER — Other Ambulatory Visit: Payer: Self-pay | Admitting: *Deleted

## 2014-09-19 MED ORDER — FLUTICASONE PROPIONATE 50 MCG/ACT NA SUSP: NASAL | Status: DC

## 2014-12-20 ENCOUNTER — Other Ambulatory Visit: Payer: Self-pay | Admitting: Internal Medicine

## 2015-01-09 ENCOUNTER — Other Ambulatory Visit: Payer: Self-pay | Admitting: Internal Medicine

## 2015-03-16 ENCOUNTER — Other Ambulatory Visit: Payer: Self-pay | Admitting: Cardiology

## 2015-03-18 NOTE — Telephone Encounter (Signed)
Rx(s) sent to pharmacy electronically.  

## 2015-07-03 ENCOUNTER — Other Ambulatory Visit: Payer: Self-pay

## 2015-07-03 MED ORDER — METOPROLOL SUCCINATE ER 50 MG PO TB24: 50.0000 mg | ORAL_TABLET | Freq: Every day | ORAL | Status: DC

## 2015-07-11 ENCOUNTER — Encounter: Payer: Self-pay | Admitting: Cardiology

## 2015-07-17 NOTE — Progress Notes (Signed)
HPI: FU HOCM. A previous Myoview in July 2008 showed no ischemia or infarction. Exercise treadmill July 2016 showed normal systolic blood pressure response. Holter monitor July 2016 showed sinus with PACs but no nonsustained ventricular tachycardia. Echocardiogram July 2016 showed normal LV systolic function, moderate left ventricular hypertrophy and systolic anterior motion of the mitral valve with gradient at rest of 11 mmHg. Since last seen, He has dyspnea with more moderate activities but not routine activities. No orthopnea, PND, chest pain or syncope. Mild pedal edema.  Current Outpatient Prescriptions  Medication Sig Dispense Refill  . Cetirizine HCl (ZYRTEC ALLERGY PO) Take 1 tablet by mouth daily.    . fluticasone (FLONASE) 50 MCG/ACT nasal spray USSE 2 SPRAYS IN EACH NOSTRIL EVERY DAY 48 g 3  . metoprolol succinate (TOPROL-XL) 50 MG 24 hr tablet Take 1 tablet (50 mg total) by mouth daily. Take with or immediately following a meal. 90 tablet 0  . Multiple Vitamin (MULTIVITAMIN) tablet Take 1 tablet by mouth daily.     Marland Kitchen RESVERATROL PO Take 325 each by mouth.      No current facility-administered medications for this visit.     Past Medical History  Diagnosis Date  . Allergy   . HOCM (hypertrophic obstructive cardiomyopathy) ([place])   . Hypertension   . External hemorrhoids   . Diverticulosis   . Colon polyp     Mucosal prolapse polyp    Past Surgical History  Procedure Laterality Date  . Tonsillectomy    . Colonoscopy      Social History   Social History  . Marital Status: Married    Spouse Name: N/A  . Number of Children: 0  . Years of Education: N/A   Occupational History  . Facilities manager    Social History Main Topics  . Smoking status: Former Smoker    Quit date: 05/06/1984  . Smokeless tobacco: Never Used  . Alcohol Use: 0.0 oz/week    0 Standard drinks or equivalent per week     Comment: Occassionally  . Drug Use: No  . Sexual Activity: Not  on file   Other Topics Concern  . Not on file   Social History Narrative    Family History  Problem Relation Age of Onset  . Pancreatic cancer Father   . Colon polyps Father     Benign  . Colon cancer Neg Hx   . Diabetes Neg Hx   . Kidney disease Neg Hx   . Gallbladder disease Neg Hx   . Esophageal cancer Neg Hx     ROS: no fevers or chills, productive cough, hemoptysis, dysphasia, odynophagia, melena, hematochezia, dysuria, hematuria, rash, seizure activity, orthopnea, PND, claudication. Remaining systems are negative.  Physical Exam: Well-developed well-nourished in no acute distress.  Skin is warm and dry.  HEENT is normal.  Neck is supple.  Chest is clear to auscultation with normal expansion.  Cardiovascular exam is regular rate and rhythm.  2/6 systolic murmur left sternal border that increases with Valsalva. Abdominal exam nontender or distended. No masses palpated. Extremities show no edema. neuro grossly intact  ECG Sinus bradycardia, left ventricular hypertrophy with repolarization abnormality, cannot rule out prior septal infarct.  A/P   1 hypertrophic cardiopathy myopathy-symptoms are relatively stable. Continue beta blocker. He has mild pedal edema. We will consider adding low-dose diuretic in the future if needed. Would like to avoid as this will exacerbate outflow obstruction potentially. Repeat echocardiogram. Previous Holter showed no nonsustained  ventricular tachycardia and exercise treadmill showed no decrease in systolic blood pressure with activities. No family history of sudden death and no history of syncope. 2 hypertension-blood pressure controlled. Continue present medications.  Kirk Ruths, MD

## 2015-07-22 ENCOUNTER — Ambulatory Visit (INDEPENDENT_AMBULATORY_CARE_PROVIDER_SITE_OTHER): Payer: BLUE CROSS/BLUE SHIELD | Admitting: Cardiology

## 2015-07-22 ENCOUNTER — Encounter: Payer: Self-pay | Admitting: Cardiology

## 2015-07-22 VITALS — BP 139/81 | HR 47 | Ht 70.0 in | Wt 218.4 lb

## 2015-07-22 DIAGNOSIS — I10 Essential (primary) hypertension: Secondary | ICD-10-CM

## 2015-07-22 DIAGNOSIS — I421 Obstructive hypertrophic cardiomyopathy: Secondary | ICD-10-CM | POA: Diagnosis not present

## 2015-07-22 NOTE — Patient Instructions (Signed)
Medications NO CHANGE   Procedures  Your physician has requested that you have an echocardiogram. Echocardiography is a painless test that uses sound waves to create images of your heart. It provides your doctor with information about the size and shape of your heart and how well your heart's chambers and valves are working. This procedure takes approximately one hour. There are no restrictions for this procedure.   Follow-up in 1 year with Dr. Stanford Breed.  If you need a refill on your cardiac medications before your next appointment, please call your pharmacy.

## 2015-08-09 ENCOUNTER — Other Ambulatory Visit: Payer: Self-pay

## 2015-08-09 ENCOUNTER — Encounter (INDEPENDENT_AMBULATORY_CARE_PROVIDER_SITE_OTHER): Payer: Self-pay

## 2015-08-09 ENCOUNTER — Ambulatory Visit (HOSPITAL_COMMUNITY): Payer: BLUE CROSS/BLUE SHIELD | Attending: Internal Medicine

## 2015-08-09 DIAGNOSIS — I421 Obstructive hypertrophic cardiomyopathy: Secondary | ICD-10-CM | POA: Insufficient documentation

## 2015-08-09 DIAGNOSIS — I77810 Thoracic aortic ectasia: Secondary | ICD-10-CM | POA: Insufficient documentation

## 2015-08-09 DIAGNOSIS — Z87891 Personal history of nicotine dependence: Secondary | ICD-10-CM | POA: Diagnosis not present

## 2015-08-09 DIAGNOSIS — I059 Rheumatic mitral valve disease, unspecified: Secondary | ICD-10-CM | POA: Diagnosis not present

## 2015-08-09 DIAGNOSIS — I351 Nonrheumatic aortic (valve) insufficiency: Secondary | ICD-10-CM | POA: Insufficient documentation

## 2015-08-09 DIAGNOSIS — I119 Hypertensive heart disease without heart failure: Secondary | ICD-10-CM | POA: Insufficient documentation

## 2015-09-25 ENCOUNTER — Other Ambulatory Visit: Payer: Self-pay | Admitting: Cardiovascular Disease

## 2015-09-27 ENCOUNTER — Other Ambulatory Visit: Payer: Self-pay | Admitting: Internal Medicine

## 2015-10-29 ENCOUNTER — Other Ambulatory Visit: Payer: Self-pay | Admitting: Internal Medicine

## 2015-11-13 ENCOUNTER — Telehealth: Payer: Self-pay | Admitting: Cardiology

## 2015-11-13 NOTE — Telephone Encounter (Signed)
Pt is returning your call

## 2015-11-13 NOTE — Telephone Encounter (Signed)
Spoke with pt, echo results discussed in detail with the patient.

## 2015-11-13 NOTE — Telephone Encounter (Signed)
Findings c/w known HOCM.

## 2015-11-13 NOTE — Telephone Encounter (Signed)
New Message  Pt voiced wanting nurse to call about his echocardiogram.

## 2015-11-13 NOTE — Telephone Encounter (Signed)
Spoke with patient. He said he changed phone carriers and was unable to retrieve messages shortly after his echo. He would like the results. It looks as though Dr Stanford Breed was the one reading that day; however, do not see any communication of results or final review of echo. Patient very understanding and praised Dr Stanford Breed and Hilda Blades, RN for their fabulous care always.  Routing to Dr Stanford Breed and Hilda Blades for final result review.

## 2016-01-16 ENCOUNTER — Ambulatory Visit (INDEPENDENT_AMBULATORY_CARE_PROVIDER_SITE_OTHER): Payer: BLUE CROSS/BLUE SHIELD | Admitting: Adult Health

## 2016-01-16 ENCOUNTER — Encounter: Payer: Self-pay | Admitting: Adult Health

## 2016-01-16 VITALS — BP 144/80 | Temp 98.5°F | Ht 70.0 in | Wt 225.7 lb

## 2016-01-16 DIAGNOSIS — J0110 Acute frontal sinusitis, unspecified: Secondary | ICD-10-CM

## 2016-01-16 MED ORDER — DOXYCYCLINE HYCLATE 100 MG PO CAPS: 100.0000 mg | ORAL_CAPSULE | Freq: Two times a day (BID) | ORAL | 0 refills | Status: DC

## 2016-01-16 NOTE — Progress Notes (Signed)
Subjective:    Patient ID: George [name], male    DOB: [birth year], 66 y.o.   MRN: [id number]  HPI  66 year old male who  has a past medical history of Allergy; Colon polyp; Diverticulosis; External hemorrhoids; HOCM (hypertrophic obstructive cardiomyopathy) ([place]); and Hypertension. He presents with the complaint of sinus pain and pressure, non productive cough, chest tightness, ringing ears.   His symptoms have been presents for 4-5 days and have not showed any improvement.   He denies any fevers, n/v/d.   He has been using OTC Flonase and cold and cough medications with little relief  Review of Systems  Constitutional: Positive for activity change and fatigue. Negative for appetite change and fever.  HENT: Positive for congestion, postnasal drip, rhinorrhea, sinus pain and sinus pressure. Negative for ear discharge and ear pain.   Respiratory: Positive for cough and shortness of breath.   Cardiovascular: Negative.   Gastrointestinal: Negative.   Musculoskeletal: Negative.   All other systems reviewed and are negative.  Past Medical History:  Diagnosis Date  . Allergy   . Colon polyp    Mucosal prolapse polyp  . Diverticulosis   . External hemorrhoids   . HOCM (hypertrophic obstructive cardiomyopathy) ([place])   . Hypertension     Social History   Social History  . Marital status: Married    Spouse name: N/A  . Number of children: 0  . Years of education: N/A   Occupational History  . Facilities manager    Social History Main Topics  . Smoking status: Former Smoker    Quit date: 05/06/1984  . Smokeless tobacco: Never Used  . Alcohol use 0.0 oz/week     Comment: Occassionally  . Drug use: No  . Sexual activity: Not on file   Other Topics Concern  . Not on file   Social History Narrative  . No narrative on file    Past Surgical History:  Procedure Laterality Date  . COLONOSCOPY    . TONSILLECTOMY      Family History  Problem Relation Age of Onset  .  Pancreatic cancer Father   . Colon polyps Father     Benign  . Colon cancer Neg Hx   . Diabetes Neg Hx   . Kidney disease Neg Hx   . Gallbladder disease Neg Hx   . Esophageal cancer Neg Hx     Allergies  Allergen Reactions  . Sulfonamide Derivatives     Current Outpatient Prescriptions on File Prior to Visit  Medication Sig Dispense Refill  . fluticasone (FLONASE) 50 MCG/ACT nasal spray USSE 2 SPRAYS IN EACH NOSTRIL EVERY DAY 48 g 2  . metoprolol succinate (TOPROL-XL) 50 MG 24 hr tablet Take 1 tablet (50 mg total) by mouth daily. Take with or immediately following a meal. 90 tablet 3  . Multiple Vitamin (MULTIVITAMIN) tablet Take 1 tablet by mouth daily.     Marland Kitchen RESVERATROL PO Take 325 each by mouth.     . Cetirizine HCl (ZYRTEC ALLERGY PO) Take 1 tablet by mouth daily.     No current facility-administered medications on file prior to visit.     BP (!) 144/80   Temp 98.5 F (36.9 C) (Oral)   Ht 5\' 10"  (1.778 m)   Wt 225 lb 11.2 oz (102.4 kg)   BMI 32.38 kg/m       Objective:   Physical Exam  Constitutional: He is oriented to person, place, and time. He appears well-developed and  well-nourished. No distress.  HENT:  Head: Normocephalic and atraumatic.  Right Ear: Hearing, tympanic membrane, external ear and ear canal normal.  Left Ear: Tympanic membrane, external ear and ear canal normal.  Nose: Mucosal edema and rhinorrhea present. Right sinus exhibits frontal sinus tenderness. Right sinus exhibits no maxillary sinus tenderness. Left sinus exhibits frontal sinus tenderness.  Mouth/Throat: Uvula is midline, oropharynx is clear and moist and mucous membranes are normal. No oropharyngeal exudate.  Eyes: Conjunctivae and EOM are normal. Pupils are equal, round, and reactive to light. Right eye exhibits no discharge. Left eye exhibits no discharge.  Neck: Normal range of motion. Neck supple. No tracheal deviation present. No thyromegaly present.  Cardiovascular: Normal rate,  regular rhythm, normal heart sounds and intact distal pulses.  Exam reveals no friction rub.   No murmur heard. Pulmonary/Chest: Effort normal and breath sounds normal. No stridor. No respiratory distress. He has no wheezes. He has no rales. He exhibits no tenderness.  Lymphadenopathy:    He has cervical adenopathy.  Neurological: He is alert and oriented to person, place, and time.  Skin: Skin is warm and dry. No rash noted. He is not diaphoretic. No erythema. No pallor.  Psychiatric: He has a normal mood and affect. His behavior is normal. Judgment and thought content normal.  Nursing note and vitals reviewed.     Assessment & Plan:  1. Acute non-recurrent frontal sinusitis - doxycycline (VIBRAMYCIN) 100 MG capsule; Take 1 capsule (100 mg total) by mouth 2 (two) times daily.  Dispense: 14 capsule; Refill: 0 - Flonase and Mucinex - Rest and stay hydrated  Dorothyann Peng, NP

## 2016-02-25 ENCOUNTER — Ambulatory Visit (INDEPENDENT_AMBULATORY_CARE_PROVIDER_SITE_OTHER): Payer: BLUE CROSS/BLUE SHIELD | Admitting: Internal Medicine

## 2016-02-25 ENCOUNTER — Encounter: Payer: Self-pay | Admitting: Internal Medicine

## 2016-02-25 VITALS — BP 132/76 | HR 59 | Temp 98.3°F | Ht 69.5 in | Wt 223.0 lb

## 2016-02-25 DIAGNOSIS — Z0000 Encounter for general adult medical examination without abnormal findings: Secondary | ICD-10-CM

## 2016-02-25 LAB — COMPREHENSIVE METABOLIC PANEL
ALK PHOS: 41 U/L (ref 39–117)
ALT: 13 U/L (ref 0–53)
AST: 17 U/L (ref 0–37)
Albumin: 4.7 g/dL (ref 3.5–5.2)
BUN: 17 mg/dL (ref 6–23)
CALCIUM: 9.7 mg/dL (ref 8.4–10.5)
CHLORIDE: 101 meq/L (ref 96–112)
CO2: 28 mEq/L (ref 19–32)
Creatinine, Ser: 0.9 mg/dL (ref 0.40–1.50)
GFR: 90.26 mL/min (ref >60.00)
Glucose, Bld: 98 mg/dL (ref 70–99)
Potassium: 4.3 mEq/L (ref 3.5–5.1)
Sodium: 138 mEq/L (ref 135–145)
Total Bilirubin: 0.8 mg/dL (ref 0.2–1.2)
Total Protein: 6.7 g/dL (ref 6.0–8.3)

## 2016-02-25 LAB — POCT URINALYSIS DIPSTICK
Bilirubin, UA: NEGATIVE
Blood, UA: NEGATIVE
Glucose, UA: NEGATIVE
KETONES UA: NEGATIVE
Leukocytes, UA: NEGATIVE
Nitrite, UA: NEGATIVE
PH UA: 6
PROTEIN UA: NEGATIVE
SPEC GRAV UA: 1.01
Urobilinogen, UA: 0.2

## 2016-02-25 LAB — CBC WITH DIFFERENTIAL/PLATELET
Basophils Absolute: 0.1 10*3/uL (ref 0.0–0.1)
Basophils Relative: 0.8 % (ref 0.0–3.0)
Eosinophils Absolute: 0.1 10*3/uL (ref 0.0–0.7)
Eosinophils Relative: 1.7 % (ref 0.0–5.0)
HCT: 43.1 % (ref 39.0–52.0)
Hemoglobin: 15.1 g/dL (ref 13.0–17.0)
Lymphocytes Relative: 25.1 % (ref 12.0–46.0)
Lymphs Abs: 1.8 10*3/uL (ref 0.7–4.0)
MCHC: 35 g/dL (ref 30.0–36.0)
MCV: 85.7 fl (ref 78.0–100.0)
MONO ABS: 0.5 10*3/uL (ref 0.1–1.0)
Monocytes Relative: 7.6 % (ref 3.0–12.0)
Neutro Abs: 4.5 10*3/uL (ref 1.4–7.7)
Neutrophils Relative %: 64.8 % (ref 43.0–77.0)
PLATELETS: 195 10*3/uL (ref 150.0–400.0)
RBC: 5.04 Mil/uL (ref 4.22–5.81)
RDW: 13.7 % (ref 11.5–15.5)
WBC: 7 10*3/uL (ref 4.0–10.5)

## 2016-02-25 LAB — LIPID PANEL
CHOLESTEROL: 188 mg/dL (ref 0–200)
HDL: 45.1 mg/dL (ref >39.00)
LDL CALC: 114 mg/dL — AB (ref 0–99)
NonHDL: 143.28
TRIGLYCERIDES: 144 mg/dL (ref 0.0–149.0)
Total CHOL/HDL Ratio: 4
VLDL: 28.8 mg/dL (ref 0.0–40.0)

## 2016-02-25 LAB — TSH: TSH: 1.18 u[IU]/mL (ref 0.35–4.50)

## 2016-02-25 NOTE — Progress Notes (Signed)
Subjective:    Patient ID: George [name], male    DOB: 05/09/1950, 66 y.o.   MRN: [id number]  HPI  66 year old patient who is seen today for a preventive health exam .  He is followed by cardiology with hypertrophic cardiomyopathy and has done well. He has a history of allergic rhinitis and generally enjoys excellent health  Family history both parents died a copy K's.  The pancreatic cancer One brother and one sister both deceased, details are unclear.  Brother may have died from a GI bleed.  Sr. 2022/07/03 have died from ischemic bowel  Past Medical History:  Diagnosis Date  . Allergy   . Colon polyp    Mucosal prolapse polyp  . Diverticulosis   . External hemorrhoids   . HOCM (hypertrophic obstructive cardiomyopathy) ([place])   . Hypertension      Social History   Social History  . Marital status: Married    Spouse name: N/A  . Number of children: 0  . Years of education: N/A   Occupational History  . Facilities manager    Social History Main Topics  . Smoking status: Former Smoker    Quit date: 05/06/1984  . Smokeless tobacco: Never Used  . Alcohol use 0.0 oz/week     Comment: Occassionally  . Drug use: No  . Sexual activity: Not on file   Other Topics Concern  . Not on file   Social History Narrative  . No narrative on file    Past Surgical History:  Procedure Laterality Date  . COLONOSCOPY    . TONSILLECTOMY      Family History  Problem Relation Age of Onset  . Pancreatic cancer Father   . Colon polyps Father     Benign  . Colon cancer Neg Hx   . Diabetes Neg Hx   . Kidney disease Neg Hx   . Gallbladder disease Neg Hx   . Esophageal cancer Neg Hx     Allergies  Allergen Reactions  . Sulfonamide Derivatives     Current Outpatient Prescriptions on File Prior to Visit  Medication Sig Dispense Refill  . Cetirizine HCl (ZYRTEC ALLERGY PO) Take 1 tablet by mouth daily.    . fluticasone (FLONASE) 50 MCG/ACT nasal spray USSE 2 SPRAYS IN EACH NOSTRIL  EVERY DAY 48 g 2  . metoprolol succinate (TOPROL-XL) 50 MG 24 hr tablet Take 1 tablet (50 mg total) by mouth daily. Take with or immediately following a meal. 90 tablet 3  . Multiple Vitamin (MULTIVITAMIN) tablet Take 1 tablet by mouth daily.     Marland Kitchen RESVERATROL PO Take 325 each by mouth.      No current facility-administered medications on file prior to visit.     BP 132/76 (BP Location: Left Arm, Patient Position: Sitting, Cuff Size: Normal)   Pulse (!) 59   Temp 98.3 F (36.8 C) (Oral)   Ht 5' 9.5" (1.765 m)   Wt 223 lb (101.2 kg)   SpO2 98%   BMI 32.46 kg/m     Review of Systems  Constitutional: Negative for appetite change, chills, fatigue and fever.  HENT: Negative for congestion, dental problem, ear pain, hearing loss, sore throat, tinnitus, trouble swallowing and voice change.   Eyes: Negative for pain, discharge and visual disturbance.  Respiratory: Negative for cough, chest tightness, wheezing and stridor.   Cardiovascular: Negative for chest pain, palpitations and leg swelling.  Gastrointestinal: Negative for abdominal distention, abdominal pain, blood in stool, constipation, diarrhea, nausea  and vomiting.  Genitourinary: Negative for difficulty urinating, discharge, flank pain, genital sores, hematuria and urgency.  Musculoskeletal: Negative for arthralgias, back pain, gait problem, joint swelling, myalgias and neck stiffness.  Skin: Negative for rash.  Neurological: Negative for dizziness, syncope, speech difficulty, weakness, numbness and headaches.  Hematological: Negative for adenopathy. Does not bruise/bleed easily.  Psychiatric/Behavioral: Negative for behavioral problems and dysphoric mood. The patient is not nervous/anxious.        Objective:   Physical Exam  Constitutional: He appears well-developed and well-nourished.  HENT:  Head: Normocephalic and atraumatic.  Right Ear: External ear normal.  Left Ear: External ear normal.  Nose: Nose normal.    Mouth/Throat: Oropharynx is clear and moist.  Eyes: Conjunctivae and EOM are normal. Pupils are equal, round, and reactive to light. No scleral icterus.  Neck: Normal range of motion. Neck supple. No JVD present. No thyromegaly present.  Cardiovascular: Regular rhythm and intact distal pulses.  Exam reveals no gallop and no friction rub.   Murmur heard. Grade 3/6 systolic murmur loudest at the apex Decrease right dorsalis pedis pulse  Pulmonary/Chest: Effort normal and breath sounds normal. He exhibits no tenderness.  Abdominal: Soft. Bowel sounds are normal. He exhibits no distension and no mass. There is no tenderness.  Genitourinary: Prostate normal and penis normal.  Musculoskeletal: Normal range of motion. He exhibits no edema or tenderness.  Lymphadenopathy:    He has no cervical adenopathy.  Neurological: He is alert. He has normal reflexes. No cranial nerve deficit. Coordination normal.  Skin: Skin is warm and dry. No rash noted.  Psychiatric: He has a normal mood and affect. His behavior is normal.          Assessment & Plan:   Preventive health exam.  We'll check screening lab.  The patient is understandably concerned about his personal risk of pancreatic cancer.  Will schedule a complete abdominal ultrasound, which will also screen for AAA Follow-up one year Cardiology follow-up as scheduled  We'll regular exercise, weight loss encouraged  [name]

## 2016-02-25 NOTE — Patient Instructions (Addendum)
Limit your sodium (Salt) intake    It is important that you exercise regularly, at least 20 minutes 3 to 4 times per week.  If you develop chest pain or shortness of breath seek  medical attention.  You need to lose weight.  Consider a lower calorie diet and regular exercise.  Return in one year for follow-up  Health Maintenance, Male A healthy lifestyle and preventative care can promote health and wellness.  Maintain regular health, dental, and eye exams.  Eat a healthy diet. Foods like vegetables, fruits, whole grains, low-fat dairy products, and lean protein foods contain the nutrients you need and are low in calories. Decrease your intake of foods high in solid fats, added sugars, and salt. Get information about a proper diet from your health care provider, if necessary.  Regular physical exercise is one of the most important things you can do for your health. Most adults should get at least 150 minutes of moderate-intensity exercise (any activity that increases your heart rate and causes you to sweat) each week. In addition, most adults need muscle-strengthening exercises on 2 or more days a week.   Maintain a healthy weight. The body mass index (BMI) is a screening tool to identify possible weight problems. It provides an estimate of body fat based on height and weight. Your health care provider can find your BMI and can help you achieve or maintain a healthy weight. For males 20 years and older:  A BMI below 18.5 is considered underweight.  A BMI of 18.5 to 24.9 is normal.  A BMI of 25 to 29.9 is considered overweight.  A BMI of 30 and above is considered obese.  Maintain normal blood lipids and cholesterol by exercising and minimizing your intake of saturated fat. Eat a balanced diet with plenty of fruits and vegetables. Blood tests for lipids and cholesterol should begin at age 54 and be repeated every 5 years. If your lipid or cholesterol levels are high, you are over age 10, or  you are at high risk for heart disease, you may need your cholesterol levels checked more frequently.Ongoing high lipid and cholesterol levels should be treated with medicines if diet and exercise are not working.  If you smoke, find out from your health care provider how to quit. If you do not use tobacco, do not start.  Lung cancer screening is recommended for adults aged 4-80 years who are at high risk for developing lung cancer because of a history of smoking. A yearly low-dose CT scan of the lungs is recommended for people who have at least a 30-pack-year history of smoking and are current smokers or have quit within the past 15 years. A pack year of smoking is smoking an average of 1 pack of cigarettes a day for 1 year (for example, a 30-pack-year history of smoking could mean smoking 1 pack a day for 30 years or 2 packs a day for 15 years). Yearly screening should continue until the smoker has stopped smoking for at least 15 years. Yearly screening should be stopped for people who develop a health problem that would prevent them from having lung cancer treatment.  If you choose to drink alcohol, do not have more than 2 drinks per day. One drink is considered to be 12 oz (360 mL) of beer, 5 oz (150 mL) of wine, or 1.5 oz (45 mL) of liquor.  Avoid the use of street drugs. Do not share needles with anyone. Ask for help if you  need support or instructions about stopping the use of drugs.  High blood pressure causes heart disease and increases the risk of stroke. High blood pressure is more likely to develop in:  People who have blood pressure in the end of the normal range (100-139/85-89 mm Hg).  People who are overweight or obese.  People who are African American.  If you are 76-47 years of age, have your blood pressure checked every 3-5 years. If you are 62 years of age or older, have your blood pressure checked every year. You should have your blood pressure measured twice-once when you are  at a hospital or clinic, and once when you are not at a hospital or clinic. Record the average of the two measurements. To check your blood pressure when you are not at a hospital or clinic, you can use:  An automated blood pressure machine at a pharmacy.  A home blood pressure monitor.  If you are 35-106 years old, ask your health care provider if you should take aspirin to prevent heart disease.  Diabetes screening involves taking a blood sample to check your fasting blood sugar level. This should be done once every 3 years after age 50 if you are at a normal weight and without risk factors for diabetes. Testing should be considered at a younger age or be carried out more frequently if you are overweight and have at least 1 risk factor for diabetes.  Colorectal cancer can be detected and often prevented. Most routine colorectal cancer screening begins at the age of 22 and continues through age 36. However, your health care provider may recommend screening at an earlier age if you have risk factors for colon cancer. On a yearly basis, your health care provider may provide home test kits to check for hidden blood in the stool. A small camera at the end of a tube may be used to directly examine the colon (sigmoidoscopy or colonoscopy) to detect the earliest forms of colorectal cancer. Talk to your health care provider about this at age 32 when routine screening begins. A direct exam of the colon should be repeated every 5-10 years through age 68, unless early forms of precancerous polyps or small growths are found.  People who are at an increased risk for hepatitis B should be screened for this virus. You are considered at high risk for hepatitis B if:  You were born in a country where hepatitis B occurs often. Talk with your health care provider about which countries are considered high risk.  Your parents were born in a high-risk country and you have not received a shot to protect against hepatitis B  (hepatitis B vaccine).  You have HIV or AIDS.  You use needles to inject street drugs.  You live with, or have sex with, someone who has hepatitis B.  You are a man who has sex with other men (MSM).  You get hemodialysis treatment.  You take certain medicines for conditions like cancer, organ transplantation, and autoimmune conditions.  Hepatitis C blood testing is recommended for all people born from [birth year] through 1963 and any individual with known risk factors for hepatitis C.  Healthy men should no longer receive prostate-specific antigen (PSA) blood tests as part of routine cancer screening. Talk to your health care provider about prostate cancer screening.  Testicular cancer screening is not recommended for adolescents or adult males who have no symptoms. Screening includes self-exam, a health care provider exam, and other screening tests. Consult with  your health care provider about any symptoms you have or any concerns you have about testicular cancer.  Practice safe sex. Use condoms and avoid high-risk sexual practices to reduce the spread of sexually transmitted infections (STIs).  You should be screened for STIs, including gonorrhea and chlamydia if:  You are sexually active and are younger than 24 years.  You are older than 24 years, and your health care provider tells you that you are at risk for this type of infection.  Your sexual activity has changed since you were last screened, and you are at an increased risk for chlamydia or gonorrhea. Ask your health care provider if you are at risk.  If you are at risk of being infected with HIV, it is recommended that you take a prescription medicine daily to prevent HIV infection. This is called pre-exposure prophylaxis (PrEP). You are considered at risk if:  You are a man who has sex with other men (MSM).  You are a heterosexual man who is sexually active with multiple partners.  You take drugs by injection.  You are  sexually active with a partner who has HIV.  Talk with your health care provider about whether you are at high risk of being infected with HIV. If you choose to begin PrEP, you should first be tested for HIV. You should then be tested every 3 months for as long as you are taking PrEP.  Use sunscreen. Apply sunscreen liberally and repeatedly throughout the day. You should seek shade when your shadow is shorter than you. Protect yourself by wearing long sleeves, pants, a wide-brimmed hat, and sunglasses year round whenever you are outdoors.  Tell your health care provider of new moles or changes in moles, especially if there is a change in shape or color. Also, tell your health care provider if a mole is larger than the size of a pencil eraser.  A one-time screening for abdominal aortic aneurysm (AAA) and surgical repair of large AAAs by ultrasound is recommended for men aged 30-75 years who are current or former smokers.  Stay current with your vaccines (immunizations). This information is not intended to replace advice given to you by your health care provider. Make sure you discuss any questions you have with your health care provider. Document Released: 06/27/2007 Document Revised: 01/19/2014 Document Reviewed: 10/02/2014 Elsevier Interactive Patient Education  2017 [employer].

## 2016-03-11 ENCOUNTER — Telehealth: Payer: Self-pay | Admitting: Internal Medicine

## 2016-03-11 NOTE — Telephone Encounter (Signed)
Pt would like blood work results °

## 2016-03-12 NOTE — Telephone Encounter (Signed)
Spoke with pt and informed his of his normal lab results. Pt verbalized understanding.

## 2016-03-12 NOTE — Telephone Encounter (Signed)
Please call/notify patient that lab/test/procedure is normal; cholesterol is 188 and improved compared to last year

## 2016-03-12 NOTE — Telephone Encounter (Signed)
Left message on voicemail to call office.  

## 2016-03-12 NOTE — Telephone Encounter (Signed)
See message below, please advise.

## 2016-03-25 ENCOUNTER — Ambulatory Visit
Admission: RE | Admit: 2016-03-25 | Discharge: 2016-03-25 | Disposition: A | Discharge status: Home / Self Care | Payer: BLUE CROSS/BLUE SHIELD | Source: Ambulatory Visit | Attending: Internal Medicine | Admitting: Internal Medicine

## 2016-03-25 DIAGNOSIS — Z0000 Encounter for general adult medical examination without abnormal findings: Secondary | ICD-10-CM

## 2016-07-14 ENCOUNTER — Other Ambulatory Visit: Payer: Self-pay | Admitting: Internal Medicine

## 2016-08-26 ENCOUNTER — Encounter: Payer: Self-pay | Admitting: Cardiology

## 2016-09-07 NOTE — Progress Notes (Signed)
HPI: FU HOCM. A previous Myoview in July 2008 showed no ischemia or infarction. Exercise treadmill July 2016 showed normal systolic blood pressure response. Holter monitor July 2016 showed sinus with PACs but no nonsustained ventricular tachycardia. Echocardiogram repeated July 2017 and showed normal LV systolic function, severe left ventricular hypertrophy, systolic anterior motion of the mitral valve, mildly dilated aortic root and mild left atrial enlargement. The left ventricular outflow tract velocity was 4.5 m/s. Abd ultrasound 3/18 showed no aneurysm. Since last seen, he has some dyspnea with vigorous activities. This is unchanged. No orthopnea, PND, chest pain or syncope. Minimal pedal edema that improves overnight.  Current Outpatient Prescriptions  Medication Sig Dispense Refill  . Cetirizine HCl (ZYRTEC ALLERGY PO) Take 1 tablet by mouth daily.    . fluticasone (FLONASE) 50 MCG/ACT nasal spray INSTILL 2 SPRAYS INTO EACH NOSTRIL DAILY 48 g 2  . metoprolol succinate (TOPROL-XL) 50 MG 24 hr tablet Take 1 tablet (50 mg total) by mouth daily. KEEP OV. 90 tablet 0  . Multiple Vitamin (MULTIVITAMIN) tablet Take 1 tablet by mouth daily.     Marland Kitchen RESVERATROL PO Take 325 each by mouth.      No current facility-administered medications for this visit.      Past Medical History:  Diagnosis Date  . Allergy   . Colon polyp    Mucosal prolapse polyp  . Diverticulosis   . External hemorrhoids   . HOCM (hypertrophic obstructive cardiomyopathy) ([place])   . Hypertension     Past Surgical History:  Procedure Laterality Date  . COLONOSCOPY    . TONSILLECTOMY      Social History   Social History  . Marital status: Married    Spouse name: N/A  . Number of children: 0  . Years of education: N/A   Occupational History  . Facilities manager    Social History Main Topics  . Smoking status: Former Smoker    Quit date: 05/06/1984  . Smokeless tobacco: Never Used  . Alcohol use 0.0  oz/week     Comment: Occassionally  . Drug use: No  . Sexual activity: Not on file   Other Topics Concern  . Not on file   Social History Narrative  . No narrative on file    Family History  Problem Relation Age of Onset  . Pancreatic cancer Father   . Colon polyps Father        Benign  . Colon cancer Neg Hx   . Diabetes Neg Hx   . Kidney disease Neg Hx   . Gallbladder disease Neg Hx   . Esophageal cancer Neg Hx     ROS: no fevers or chills, productive cough, hemoptysis, dysphasia, odynophagia, melena, hematochezia, dysuria, hematuria, rash, seizure activity, orthopnea, PND, pedal edema, claudication. Remaining systems are negative.  Physical Exam: Well-developed well-nourished in no acute distress.  Skin is warm and dry.  HEENT is normal.  Neck is supple.  Chest is clear to auscultation with normal expansion.  Cardiovascular exam is regular rate and rhythm. 2/6 systolic murmur left sternal border. Increase with Valsalva. Abdominal exam nontender or distended. No masses palpated. Extremities show no edema. neuro grossly intact  ECG- Sinus rhythm at a rate of 61. Left ventricular hypertrophy with repolarization abnormality. personally reviewed  A/P  1 Hypertrophic cardiomyopathy-patient does have some dyspnea on exertion but symptoms are reasonably well controlled. Continue beta blocker. Plan repeat echocardiogram. Previous Holter showed no nonsustained ventricular tachycardia and exercise treadmill  did not show a decrease in systolic blood pressure with exercise. Will also repeat exercise treadmill. Patient has no family history of sudden death or syncope.  2 hypertension-blood pressure is controlled. Continue present medications.  Kirk Ruths, MD

## 2016-09-08 ENCOUNTER — Other Ambulatory Visit: Payer: Self-pay | Admitting: Cardiovascular Disease

## 2016-09-09 NOTE — Telephone Encounter (Signed)
REFILL 

## 2016-09-18 ENCOUNTER — Encounter: Payer: Self-pay | Admitting: Cardiology

## 2016-09-18 ENCOUNTER — Other Ambulatory Visit: Payer: Self-pay | Admitting: *Deleted

## 2016-09-18 ENCOUNTER — Ambulatory Visit (INDEPENDENT_AMBULATORY_CARE_PROVIDER_SITE_OTHER): Payer: BLUE CROSS/BLUE SHIELD | Admitting: Cardiology

## 2016-09-18 VITALS — BP 140/80 | HR 61 | Ht 69.0 in | Wt 212.0 lb

## 2016-09-18 DIAGNOSIS — I421 Obstructive hypertrophic cardiomyopathy: Secondary | ICD-10-CM

## 2016-09-18 DIAGNOSIS — I10 Essential (primary) hypertension: Secondary | ICD-10-CM | POA: Diagnosis not present

## 2016-09-18 MED ORDER — METOPROLOL SUCCINATE ER 50 MG PO TB24: 50.0000 mg | ORAL_TABLET | Freq: Every day | ORAL | 3 refills | Status: DC

## 2016-09-18 NOTE — Patient Instructions (Signed)
Medication Instructions:   NO CHANGE  Testing/Procedures:  Your physician has requested that you have an echocardiogram. Echocardiography is a painless test that uses sound waves to create images of your heart. It provides your doctor with information about the size and shape of your heart and how well your heart's chambers and valves are working. This procedure takes approximately one hour. There are no restrictions for this procedure.   Your physician has requested that you have an exercise tolerance test. For further information please visit [email or web address]. Please also follow instruction sheet, as given.    Follow-Up:  Your physician wants you to follow-up in: [place] will receive a reminder letter in the mail two months in advance. If you don't receive a letter, please call our office to schedule the follow-up appointment.   If you need a refill on your cardiac medications before your next appointment, please call your pharmacy.

## 2016-09-24 ENCOUNTER — Other Ambulatory Visit (HOSPITAL_COMMUNITY): Payer: BLUE CROSS/BLUE SHIELD

## 2016-09-29 ENCOUNTER — Telehealth (HOSPITAL_COMMUNITY): Payer: Self-pay

## 2016-09-29 NOTE — Telephone Encounter (Signed)
Encounter complete. 

## 2016-09-30 ENCOUNTER — Other Ambulatory Visit: Payer: Self-pay

## 2016-09-30 ENCOUNTER — Ambulatory Visit (HOSPITAL_COMMUNITY): Payer: BLUE CROSS/BLUE SHIELD | Attending: Cardiovascular Disease

## 2016-09-30 DIAGNOSIS — I119 Hypertensive heart disease without heart failure: Secondary | ICD-10-CM | POA: Insufficient documentation

## 2016-09-30 DIAGNOSIS — Z87891 Personal history of nicotine dependence: Secondary | ICD-10-CM | POA: Insufficient documentation

## 2016-09-30 DIAGNOSIS — I340 Nonrheumatic mitral (valve) insufficiency: Secondary | ICD-10-CM | POA: Diagnosis not present

## 2016-09-30 DIAGNOSIS — I421 Obstructive hypertrophic cardiomyopathy: Secondary | ICD-10-CM | POA: Diagnosis not present

## 2016-10-01 ENCOUNTER — Ambulatory Visit (HOSPITAL_COMMUNITY)
Admission: RE | Admit: 2016-10-01 | Discharge: 2016-10-01 | Disposition: A | Discharge status: Home / Self Care | Payer: BLUE CROSS/BLUE SHIELD | Source: Ambulatory Visit | Attending: Cardiology | Admitting: Cardiology

## 2016-10-01 DIAGNOSIS — I421 Obstructive hypertrophic cardiomyopathy: Secondary | ICD-10-CM | POA: Insufficient documentation

## 2016-10-01 LAB — EXERCISE TOLERANCE TEST
CHL RATE OF PERCEIVED EXERTION: 18
CSEPEDS: 1 s
CSEPEW: 10.1 METS
CSEPHR: 87 %
Exercise duration (min): 9 min
MPHR: 156 {beats}/min
Peak HR: 136 {beats}/min
Rest HR: 57 {beats}/min

## 2017-03-29 ENCOUNTER — Other Ambulatory Visit: Payer: Self-pay | Admitting: Internal Medicine

## 2017-08-10 ENCOUNTER — Ambulatory Visit (INDEPENDENT_AMBULATORY_CARE_PROVIDER_SITE_OTHER): Payer: BLUE CROSS/BLUE SHIELD | Admitting: Internal Medicine

## 2017-08-10 ENCOUNTER — Encounter: Payer: Self-pay | Admitting: Internal Medicine

## 2017-08-10 VITALS — BP 142/80 | HR 56 | Temp 98.7°F | Wt 202.6 lb

## 2017-08-10 DIAGNOSIS — Z23 Encounter for immunization: Secondary | ICD-10-CM | POA: Diagnosis not present

## 2017-08-10 DIAGNOSIS — Z0000 Encounter for general adult medical examination without abnormal findings: Secondary | ICD-10-CM

## 2017-08-10 LAB — CBC WITH DIFFERENTIAL/PLATELET
Basophils Absolute: 0.1 10*3/uL (ref 0.0–0.1)
Basophils Relative: 0.9 % (ref 0.0–3.0)
Eosinophils Absolute: 0.1 10*3/uL (ref 0.0–0.7)
Eosinophils Relative: 2.4 % (ref 0.0–5.0)
HCT: 42.7 % (ref 39.0–52.0)
HEMOGLOBIN: 15.1 g/dL (ref 13.0–17.0)
LYMPHS PCT: 21.6 % (ref 12.0–46.0)
Lymphs Abs: 1.3 10*3/uL (ref 0.7–4.0)
MCHC: 35.2 g/dL (ref 30.0–36.0)
MCV: 84.9 fl (ref 78.0–100.0)
MONOS PCT: 7 % (ref 3.0–12.0)
Monocytes Absolute: 0.4 10*3/uL (ref 0.1–1.0)
NEUTROS ABS: 4.3 10*3/uL (ref 1.4–7.7)
Neutrophils Relative %: 68.1 % (ref 43.0–77.0)
PLATELETS: 197 10*3/uL (ref 150.0–400.0)
RBC: 5.04 Mil/uL (ref 4.22–5.81)
RDW: 13.4 % (ref 11.5–15.5)
WBC: 6.2 10*3/uL (ref 4.0–10.5)

## 2017-08-10 LAB — COMPREHENSIVE METABOLIC PANEL
ALT: 11 U/L (ref 0–53)
AST: 13 U/L (ref 0–37)
Albumin: 4.6 g/dL (ref 3.5–5.2)
Alkaline Phosphatase: 36 U/L — ABNORMAL LOW (ref 39–117)
BUN: 14 mg/dL (ref 6–23)
CO2: 32 meq/L (ref 19–32)
Calcium: 10.1 mg/dL (ref 8.4–10.5)
Chloride: 100 mEq/L (ref 96–112)
Creatinine, Ser: 0.99 mg/dL (ref 0.40–1.50)
GFR: 80.49 mL/min (ref >60.00)
Glucose, Bld: 111 mg/dL — ABNORMAL HIGH (ref 70–99)
POTASSIUM: 4.6 meq/L (ref 3.5–5.1)
Sodium: 138 mEq/L (ref 135–145)
Total Bilirubin: 0.6 mg/dL (ref 0.2–1.2)
Total Protein: 6.8 g/dL (ref 6.0–8.3)

## 2017-08-10 LAB — LIPID PANEL
CHOL/HDL RATIO: 4
Cholesterol: 173 mg/dL (ref 0–200)
HDL: 45.2 mg/dL (ref >39.00)
LDL CALC: 105 mg/dL — AB (ref 0–99)
NonHDL: 127.86
Triglycerides: 115 mg/dL (ref 0.0–149.0)
VLDL: 23 mg/dL (ref 0.0–40.0)

## 2017-08-10 LAB — TSH: TSH: 0.99 u[IU]/mL (ref 0.35–4.50)

## 2017-08-10 NOTE — Progress Notes (Signed)
Subjective:    Patient ID: George [name], male    DOB: March 21, 1950, 67 y.o.   MRN: [id number]  HPI  67 year old patient who is seen today for a annual preventive health examination. He is followed closely by cardiology with a history of hypertrophic obstructive cardiomyopathy.  He is scheduled for follow-up in September. He is doing reasonably well except for some situational stress due to marital issues He has essential hypertension and a history of vasomotor rhinitis. Both parents died of apparent pancreatic cancer.  He did have a complete abdominal ultrasound last year to screen for AAA as well as pancreatic screen.  Family history-both parents died of complications of pancreatic cancer.  One brother one sister deceased brother may have died from septicemia.  Sister may have died from ischemic bowel    Past Medical History:  Diagnosis Date  . Allergy   . Colon polyp    Mucosal prolapse polyp  . Diverticulosis   . External hemorrhoids   . HOCM (hypertrophic obstructive cardiomyopathy) ([place])   . Hypertension      Social History   Socioeconomic History  . Marital status: Married    Spouse name: Not on file  . Number of children: 0  . Years of education: Not on file  . Highest education level: Not on file  Occupational History  . Occupation: Facilities manager  Social Needs  . Financial resource strain: Not on file  . Food insecurity:    Worry: Not on file    Inability: Not on file  . Transportation needs:    Medical: Not on file    Non-medical: Not on file  Tobacco Use  . Smoking status: Former Smoker    Last attempt to quit: 05/06/1984    Years since quitting: 33.2  . Smokeless tobacco: Never Used  Substance and Sexual Activity  . Alcohol use: Yes    Alcohol/week: 0.0 oz    Comment: Occassionally  . Drug use: No  . Sexual activity: Not on file  Lifestyle  . Physical activity:    Days per week: Not on file    Minutes per session: Not on file  . Stress: Not  on file  Relationships  . Social connections:    Talks on phone: Not on file    Gets together: Not on file    Attends religious service: Not on file    Active member of club or organization: Not on file    Attends meetings of clubs or organizations: Not on file    Relationship status: Not on file  . Intimate partner violence:    Fear of current or ex partner: Not on file    Emotionally abused: Not on file    Physically abused: Not on file    Forced sexual activity: Not on file  Other Topics Concern  . Not on file  Social History Narrative  . Not on file    Past Surgical History:  Procedure Laterality Date  . COLONOSCOPY    . TONSILLECTOMY      Family History  Problem Relation Age of Onset  . Pancreatic cancer Father   . Colon polyps Father        Benign  . Colon cancer Neg Hx   . Diabetes Neg Hx   . Kidney disease Neg Hx   . Gallbladder disease Neg Hx   . Esophageal cancer Neg Hx     Allergies  Allergen Reactions  . Sulfonamide Derivatives  Current Outpatient Medications on File Prior to Visit  Medication Sig Dispense Refill  . Cetirizine HCl (ZYRTEC ALLERGY PO) Take 1 tablet by mouth daily.    . fluticasone (FLONASE) 50 MCG/ACT nasal spray SPRAY 2 SPRAYS INTO EACH NOSTRIL EVERY DAY 48 g 2  . metoprolol succinate (TOPROL-XL) 50 MG 24 hr tablet Take 1 tablet (50 mg total) by mouth daily. 90 tablet 3  . Multiple Vitamin (MULTIVITAMIN) tablet Take 1 tablet by mouth daily.     Marland Kitchen RESVERATROL PO Take 325 each by mouth.      No current facility-administered medications on file prior to visit.     BP (!) 142/80 (BP Location: Right Arm, Patient Position: Sitting, Cuff Size: Normal)   Pulse (!) 56   Temp 98.7 F (37.1 C) (Oral)   Wt 202 lb 9.6 oz (91.9 kg)   SpO2 95%   BMI 29.92 kg/m     Review of Systems  Constitutional: Negative for appetite change, chills, fatigue and fever.  HENT: Positive for congestion, rhinorrhea and sinus pressure. Negative for  dental problem, ear pain, hearing loss, sore throat, tinnitus, trouble swallowing and voice change.   Eyes: Negative for pain, discharge and visual disturbance.  Respiratory: Negative for cough, chest tightness, wheezing and stridor.   Cardiovascular: Negative for chest pain, palpitations and leg swelling.  Gastrointestinal: Negative for abdominal distention, abdominal pain, blood in stool, constipation, diarrhea, nausea and vomiting.  Genitourinary: Negative for difficulty urinating, discharge, flank pain, genital sores, hematuria and urgency.  Musculoskeletal: Negative for arthralgias, back pain, gait problem, joint swelling, myalgias and neck stiffness.  Skin: Negative for rash.  Neurological: Negative for dizziness, syncope, speech difficulty, weakness, numbness and headaches.  Hematological: Negative for adenopathy. Does not bruise/bleed easily.  Psychiatric/Behavioral: Negative for behavioral problems and dysphoric mood. The patient is nervous/anxious.        Objective:   Physical Exam  Constitutional: He appears well-developed and well-nourished.  Blood pressure 130/80 Weight 202  HENT:  Head: Normocephalic and atraumatic.  Right Ear: External ear normal.  Left Ear: External ear normal.  Nose: Nose normal.  Mouth/Throat: Oropharynx is clear and moist.  Eyes: Pupils are equal, round, and reactive to light. Conjunctivae and EOM are normal. No scleral icterus.  Neck: Normal range of motion. Neck supple. No JVD present. No thyromegaly present.  Cardiovascular: Regular rhythm and intact distal pulses. Exam reveals no gallop and no friction rub.  Murmur heard. Pedal pulses faint  Grade 3/6 holosystolic murmur loudest at the base  Pulmonary/Chest: Effort normal and breath sounds normal. He exhibits no tenderness.  Abdominal: Soft. Bowel sounds are normal. He exhibits no distension and no mass. There is no tenderness.  Genitourinary: Prostate normal and penis normal. Rectal exam  shows guaiac negative stool.  Genitourinary Comments: External hemorrhoidal tags  Musculoskeletal: Normal range of motion. He exhibits no edema or tenderness.  Lymphadenopathy:    He has no cervical adenopathy.  Neurological: He is alert. He has normal reflexes. No cranial nerve deficit. Coordination normal.  Skin: Skin is warm and dry. No rash noted.  Psychiatric: He has a normal mood and affect. His behavior is normal.          Assessment & Plan:   Preventive health examination Hypertrophic cardiomyopathy.  Follow-up cardiology Vasomotor rhinitis no change in therapy Situational stress.  Patient will consider behavioral health referral Essential hypertension stable  Prevnar Review updated lab Cardiology follow-up as scheduled Follow-up in 1 year or as needed Efforts of weight  loss encouraged Low-salt diet recommended Home blood pressure monitoring recommended  Marletta Lor

## 2017-08-10 NOTE — Patient Instructions (Signed)
Limit your sodium (Salt) intake  Please check your blood pressure on a regular basis.  If it is consistently greater than 150/90, please make an office appointment.    It is important that you exercise regularly, at least 20 minutes 3 to 4 times per week.  If you develop chest pain or shortness of breath seek  medical attention.  Cardiology follow-up as scheduled  Return in 1 year for medical follow-up and annual exam

## 2017-08-11 LAB — HEPATITIS C ANTIBODY
Hepatitis C Ab: NONREACTIVE
SIGNAL TO CUT-OFF: 0.01 (ref <1.00)

## 2017-08-27 ENCOUNTER — Telehealth: Payer: Self-pay | Admitting: *Deleted

## 2017-08-27 NOTE — Telephone Encounter (Signed)
Patient requesting Shingrix. Okay to administer?

## 2017-08-30 NOTE — Telephone Encounter (Signed)
ok 

## 2017-09-06 NOTE — Telephone Encounter (Signed)
Called patient and left message to return call to schedule nurse visit for Shingrix injection. When patient returns call, ok to schedule nurse visit. Please let me know if/when appt is scheduled.

## 2017-09-08 NOTE — Telephone Encounter (Signed)
Pt has been schedule for shingrix on 09-17-17

## 2017-09-16 NOTE — Progress Notes (Signed)
HPI: FU HOCM. A previous Myoview in July 2008 showed no ischemia or infarction. Holter monitor July 2016 showed sinus with PACs but no nonsustained ventricular tachycardia. Abd ultrasound 3/18 showed no aneurysm.   Echocardiogram repeated September 2018.  Patient had vigorous LV systolic function with severe left ventricular hypertrophy, systolic anterior motion of the mitral valve and findings consistent with hypertrophic cardiomyopathy; LVOT velocity 5 m/s.  Moderate left atrial enlargement.  Exercise treadmill September 2018 showed no decrease in systolic blood pressure with exercise.  Since last seen, the patient has dyspnea with more extreme activities but not with routine activities. It is relieved with rest. It is not associated with chest pain. There is no orthopnea, PND or pedal edema. There is no syncope or palpitations. There is no exertional chest pain.   Current Outpatient Medications  Medication Sig Dispense Refill  . Cetirizine HCl (ZYRTEC ALLERGY PO) Take 1 tablet by mouth daily.    . fluticasone (FLONASE) 50 MCG/ACT nasal spray SPRAY 2 SPRAYS INTO EACH NOSTRIL EVERY DAY 48 g 2  . metoprolol succinate (TOPROL-XL) 50 MG 24 hr tablet Take 1 tablet (50 mg total) by mouth daily. 90 tablet 3  . Multiple Vitamin (MULTIVITAMIN) tablet Take 1 tablet by mouth daily.     Marland Kitchen RESVERATROL PO Take 325 each by mouth.      No current facility-administered medications for this visit.      Past Medical History:  Diagnosis Date  . Allergy   . Colon polyp    Mucosal prolapse polyp  . Diverticulosis   . Essential hypertension 09/22/2008   Qualifier: Diagnosis of  By: Burnett Kanaris    . External hemorrhoids   . HOCM (hypertrophic obstructive cardiomyopathy) ([place])   . Hypertension   . Hypertrophic obstructive cardiomyopathy ([place]) 09/24/2008   Qualifier: Diagnosis of  By: Stanford Breed, MD, Kandyce Rud   . VASOMOTOR RHINITIS 12/24/2008   Qualifier: Diagnosis of  By: Burnice Logan   MD, [place], LEFT 09/22/2008   Qualifier: Diagnosis of  By: Burnett Kanaris      Past Surgical History:  Procedure Laterality Date  . COLONOSCOPY    . TONSILLECTOMY      Social History   Socioeconomic History  . Marital status: Married    Spouse name: Not on file  . Number of children: 0  . Years of education: Not on file  . Highest education level: Not on file  Occupational History  . Occupation: Facilities manager  Social Needs  . Financial resource strain: Not on file  . Food insecurity:    Worry: Not on file    Inability: Not on file  . Transportation needs:    Medical: Not on file    Non-medical: Not on file  Tobacco Use  . Smoking status: Former Smoker    Last attempt to quit: 05/06/1984    Years since quitting: 33.3  . Smokeless tobacco: Never Used  Substance and Sexual Activity  . Alcohol use: Yes    Alcohol/week: 0.0 standard drinks    Comment: Occassionally  . Drug use: No  . Sexual activity: Not on file  Lifestyle  . Physical activity:    Days per week: Not on file    Minutes per session: Not on file  . Stress: Not on file  Relationships  . Social connections:    Talks on phone: Not on file    Gets together: Not on file  Attends religious service: Not on file    Active member of club or organization: Not on file    Attends meetings of clubs or organizations: Not on file    Relationship status: Not on file  . Intimate partner violence:    Fear of current or ex partner: Not on file    Emotionally abused: Not on file    Physically abused: Not on file    Forced sexual activity: Not on file  Other Topics Concern  . Not on file  Social History Narrative  . Not on file    Family History  Problem Relation Age of Onset  . Pancreatic cancer Father   . Colon polyps Father        Benign  . Colon cancer Neg Hx   . Diabetes Neg Hx   . Kidney disease Neg Hx   . Gallbladder disease Neg Hx   . Esophageal cancer Neg Hx       ROS: no fevers or chills, productive cough, hemoptysis, dysphasia, odynophagia, melena, hematochezia, dysuria, hematuria, rash, seizure activity, orthopnea, PND, pedal edema, claudication. Remaining systems are negative.  Physical Exam: Well-developed well-nourished in no acute distress.  Skin is warm and dry.  HEENT is normal.  Neck is supple.  Chest is clear to auscultation with normal expansion.  Cardiovascular exam is regular rate and rhythm. 2/6 systolic murmur that increases with valsalva Abdominal exam nontender or distended. No masses palpated. Extremities show no edema. neuro grossly intact  ECG-sinus bradycardia at a rate of 46.  Left ventricular hypertrophy with repolarization abnormality.  Personally reviewed  A/P  1 hypertrophic cardiomyopathy-patient is doing recently well from a symptomatic standpoint with mild dyspnea on exertion.  He is bradycardic today.  I will decrease toprol to 25 mg daily and follow heart rate.  As outlined in previous notes we have not seen nonsustained ventricular tachycardia on monitors and exercise treadmill has shown no decrease in systolic blood pressure with exercise.  There is also no family history of sudden death or syncope.  We will repeat echocardiogram.  2 hypertension-patient's blood pressure is controlled.  Continue present medications but decrease Toprol to 25 mg daily given bradycardia.  Kirk Ruths, MD

## 2017-09-17 ENCOUNTER — Telehealth: Payer: Self-pay | Admitting: Family Medicine

## 2017-09-17 ENCOUNTER — Ambulatory Visit: Payer: BLUE CROSS/BLUE SHIELD | Admitting: Family Medicine

## 2017-09-17 DIAGNOSIS — Z23 Encounter for immunization: Secondary | ICD-10-CM

## 2017-09-17 NOTE — Telephone Encounter (Signed)
Patient requesting a call back to go over lab results.

## 2017-09-17 NOTE — Telephone Encounter (Signed)
Pt was notified that Dr.Kwiatkowski is out of the office until Monday. Pt verbalized understanding.

## 2017-09-17 NOTE — Telephone Encounter (Signed)
Please result and advise 

## 2017-09-20 ENCOUNTER — Encounter: Payer: Self-pay | Admitting: Cardiology

## 2017-09-20 ENCOUNTER — Ambulatory Visit (INDEPENDENT_AMBULATORY_CARE_PROVIDER_SITE_OTHER): Payer: BLUE CROSS/BLUE SHIELD | Admitting: Cardiology

## 2017-09-20 VITALS — BP 138/82 | HR 46 | Ht 69.0 in | Wt 206.0 lb

## 2017-09-20 DIAGNOSIS — I10 Essential (primary) hypertension: Secondary | ICD-10-CM | POA: Diagnosis not present

## 2017-09-20 DIAGNOSIS — I421 Obstructive hypertrophic cardiomyopathy: Secondary | ICD-10-CM

## 2017-09-20 MED ORDER — METOPROLOL SUCCINATE ER 25 MG PO TB24: 25.0000 mg | ORAL_TABLET | Freq: Every day | ORAL | 3 refills | Status: DC

## 2017-09-20 NOTE — Telephone Encounter (Signed)
Left message to return phone call.

## 2017-09-20 NOTE — Telephone Encounter (Signed)
Please call/notify patient that lab/test/procedure is normal except for fasting blood sugar slightly elevated at 111.  Suggest more exercise and modest weight loss.  Total cholesterol 173

## 2017-09-20 NOTE — Patient Instructions (Addendum)
Medication Instructions: Your Physician recommend you make the following changes to your medication. Decrease: Toprol 25 mg daily   If you need a refill on your cardiac medications before your next appointment, please call your pharmacy.   Labwork: None  Procedures/Testing: Your physician has requested that you have an echocardiogram. Echocardiography is a painless test that uses sound waves to create images of your heart. It provides your doctor with information about the size and shape of your heart and how well your heart's chambers and valves are working. This procedure takes approximately one hour. There are no restrictions for this procedure.    Follow-Up: Your physician wants you to follow-up in 1 year with Dr. Stanford Breed. You will receive a reminder letter in the mail two months in advance. If you don't receive a letter, please call our office at [phone] to schedule this follow-up appointment.   Special Instructions:    Thank you for choosing [facility]!!

## 2017-09-21 NOTE — Telephone Encounter (Signed)
Patient notified of results and verbalized understanding.  

## 2017-10-04 ENCOUNTER — Ambulatory Visit (HOSPITAL_COMMUNITY): Payer: BLUE CROSS/BLUE SHIELD | Attending: Cardiovascular Disease

## 2017-10-04 DIAGNOSIS — Z87891 Personal history of nicotine dependence: Secondary | ICD-10-CM | POA: Diagnosis not present

## 2017-10-04 DIAGNOSIS — I421 Obstructive hypertrophic cardiomyopathy: Secondary | ICD-10-CM | POA: Diagnosis not present

## 2017-10-04 DIAGNOSIS — I10 Essential (primary) hypertension: Secondary | ICD-10-CM | POA: Insufficient documentation

## 2017-10-12 ENCOUNTER — Telehealth: Payer: Self-pay | Admitting: *Deleted

## 2017-10-12 NOTE — Telephone Encounter (Signed)
Patient Name George [name] Patient DOB 11-20-1950 Call Type Message Only Information Provided Reason for Call Request for General Office Information Initial Comment Caller states he received bill for physical in July, and wondering why [facility] sent a separate bill, instead of billing through insurance. Additional Comment Provided information for a call back from the office.

## 2017-10-12 NOTE — Telephone Encounter (Signed)
Per his account the balance from this OV is still pending Insurance: Visit Balances  Tot Chg Tot Db Adj Ins Pmt Ins Adj Self Pmt Self Adj Balance Ins Bal Self Bal  800.00 0.00 0.00 0.00 0.00 0.00 800.00 800.00 0.00   We do not have any account info on [facility]. Pt will need to contact them for explanation or to rebill to insurance.   LMTCB

## 2017-11-19 ENCOUNTER — Ambulatory Visit: Payer: BLUE CROSS/BLUE SHIELD

## 2017-11-25 ENCOUNTER — Other Ambulatory Visit: Payer: Self-pay | Admitting: Cardiology

## 2017-11-26 ENCOUNTER — Ambulatory Visit (INDEPENDENT_AMBULATORY_CARE_PROVIDER_SITE_OTHER): Payer: BLUE CROSS/BLUE SHIELD

## 2017-11-26 DIAGNOSIS — Z23 Encounter for immunization: Secondary | ICD-10-CM | POA: Diagnosis not present

## 2017-11-30 ENCOUNTER — Telehealth: Payer: Self-pay

## 2017-11-30 NOTE — Telephone Encounter (Signed)
Pt returned phone call and stated that he was " across [employer]" and wasn't close. Pt stated that I could mail the form to him to have him sign. I informed pt that it was fine to get the verbal over the phone. No further action needed!

## 2017-11-30 NOTE — Telephone Encounter (Signed)
Called pt to see if he can return to the office to sign vaccine order form.

## 2017-12-14 ENCOUNTER — Other Ambulatory Visit: Payer: Self-pay | Admitting: Internal Medicine

## 2017-12-19 IMAGING — US US ABDOMEN COMPLETE
1 series · 14 of 25 positions shown · non-contrast
Comparison: CT scan dated 12/28/2006

CLINICAL DATA: Family history of pancreatic cancer.

EXAM:
ABDOMEN ULTRASOUND COMPLETE

[Series 1: us abdomen complete · 0.17mm/px · 14 of 79 slices shown]
[im 1/79]
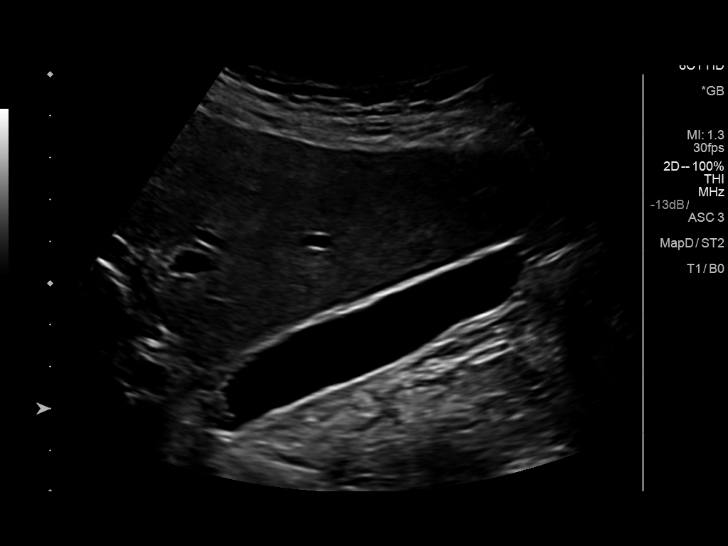
[im 7/79]
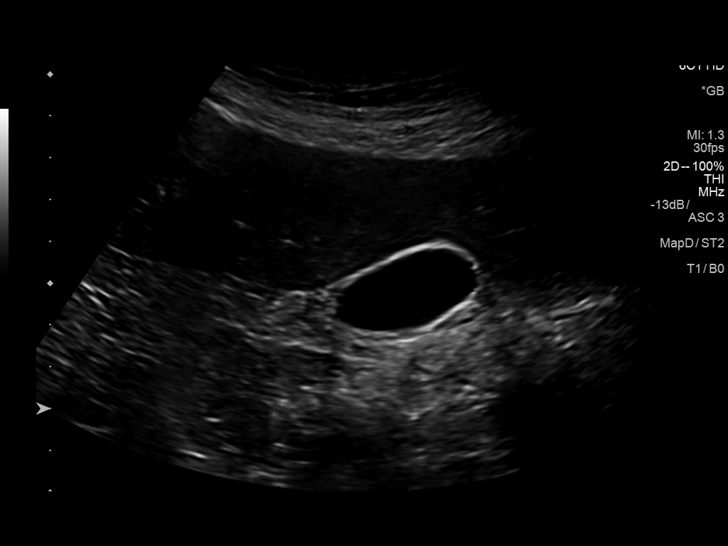
[im 14/79]
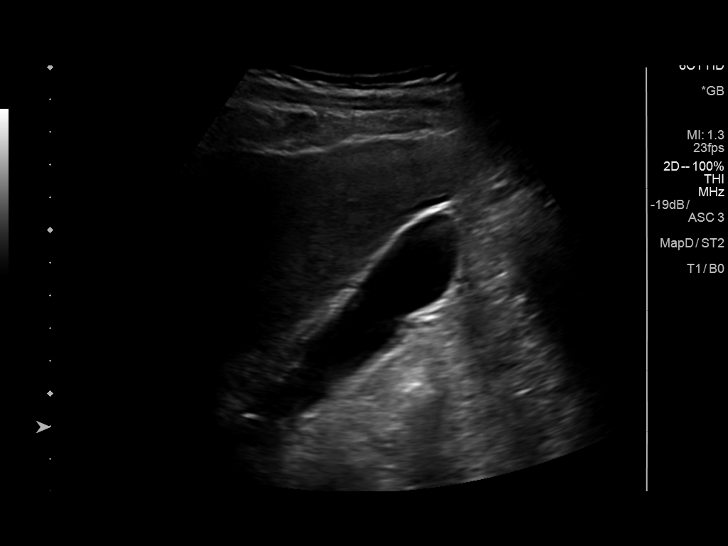
[im 20/79]
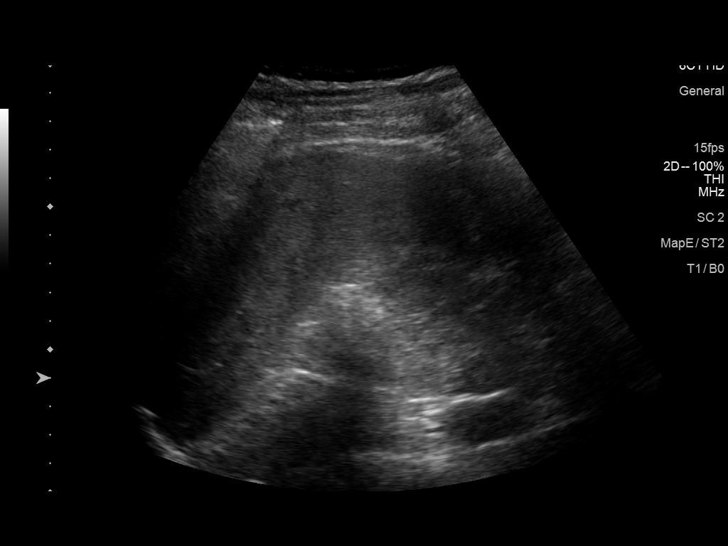
[im 27/79]
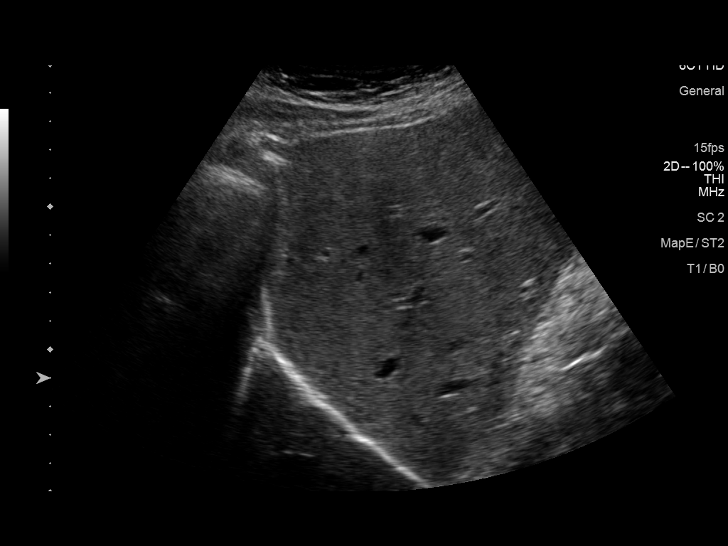
[im 30/79]
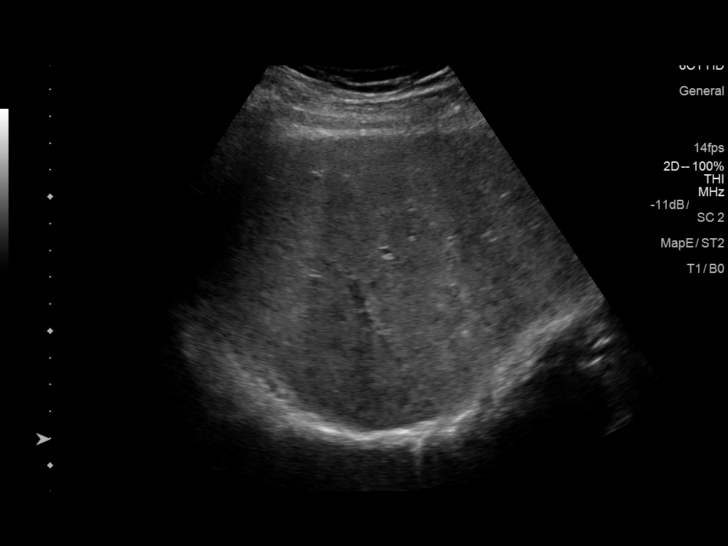
[im 36/79]
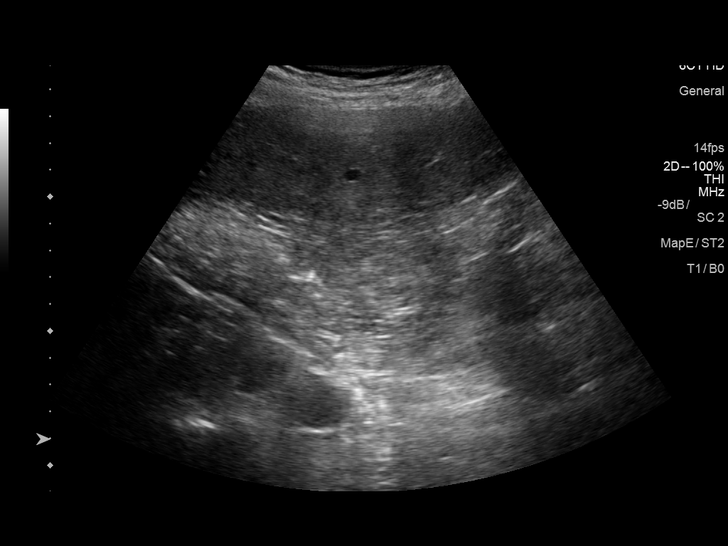
[im 43/79]
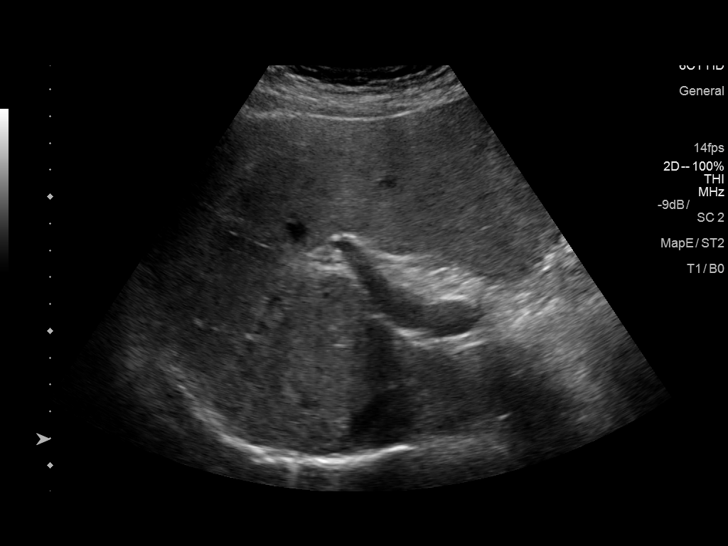
[im 49/79]
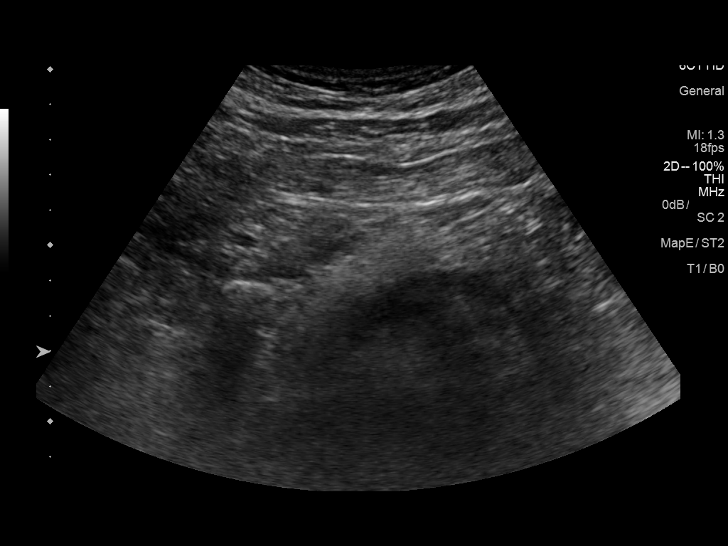
[im 53/79]
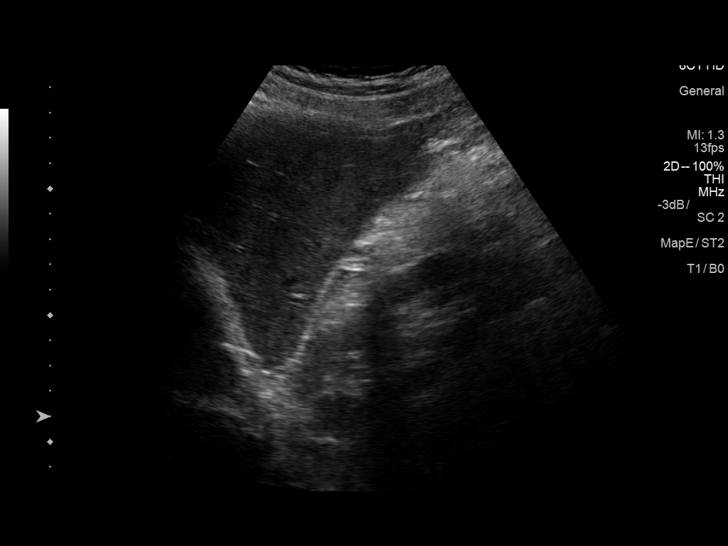
[im 59/79]
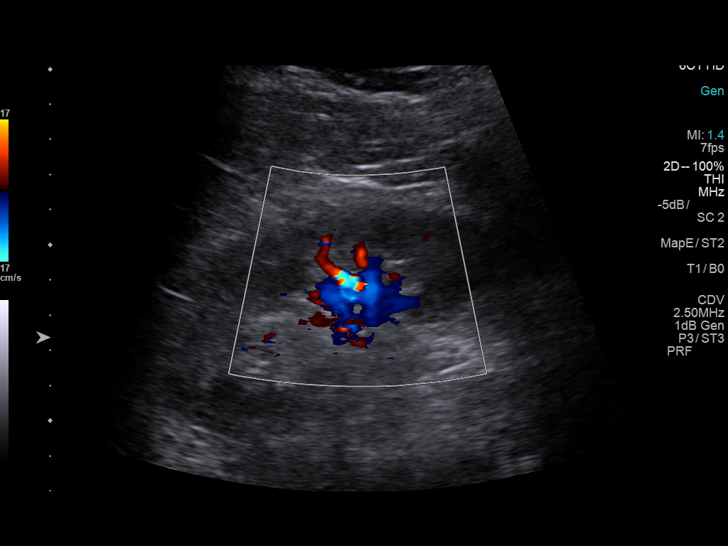
[im 66/79]
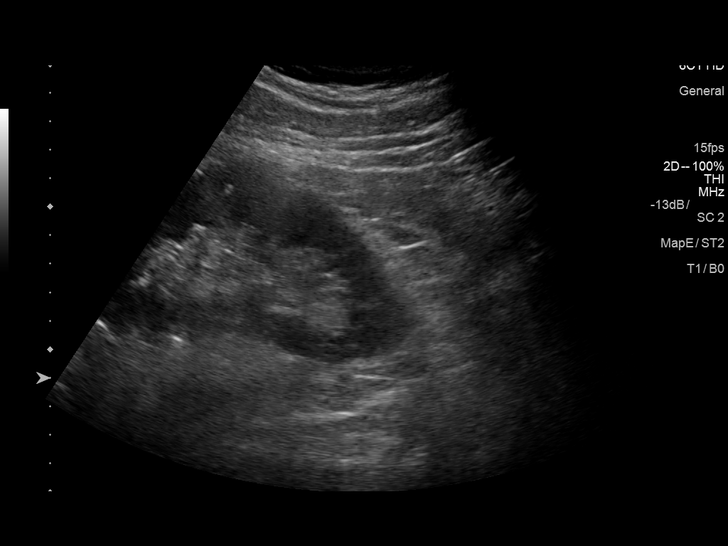
[im 72/79]
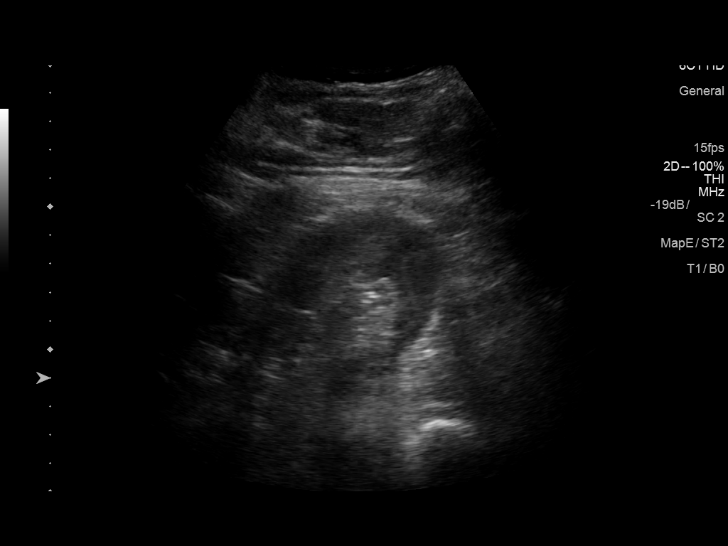
[im 79/79]
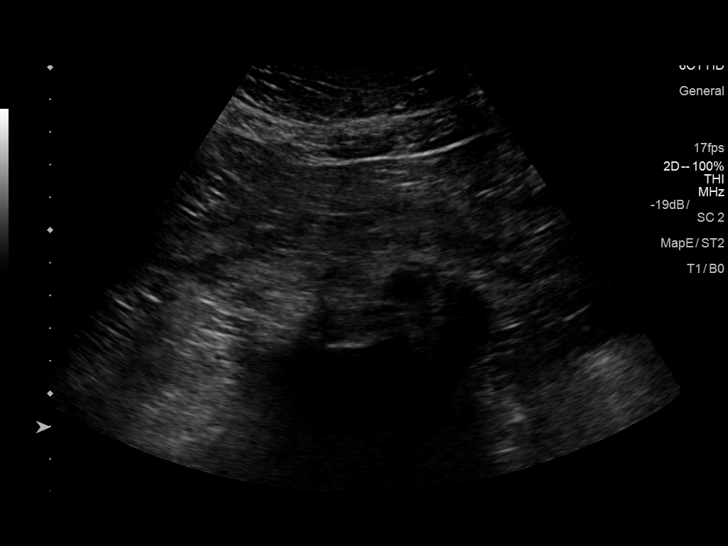

[14 of 25 positions shown; findings below may reference images not displayed]

FINDINGS: Gallbladder: No gallstones or wall thickening visualized. No
sonographic Murphy sign noted by sonographer.

Common bile duct: Diameter: 3.5 mm.

Liver: 1.2 cm simple cyst in the tip of the right lobe of the liver.
No other focal lesions in the liver. Within normal limits in
parenchymal echogenicity.

IVC: No abnormality visualized.

Pancreas: Visualized portion unremarkable.

Spleen: Size and appearance within normal limits.

Right Kidney: Length: 2.5 cm. Echogenicity within normal limits. No
mass or hydronephrosis visualized.

Left Kidney: Length: 11.7 cm. Echogenicity within normal limits. No
mass or hydronephrosis visualized.

Abdominal aorta: No aneurysm visualized.

Other findings: None.
IMPRESSION: 1.2 cm simple cyst in the right lobe of the liver. Otherwise, normal
exam.

## 2017-12-22 ENCOUNTER — Other Ambulatory Visit: Payer: Self-pay | Admitting: *Deleted

## 2017-12-22 MED ORDER — FLUTICASONE PROPIONATE 50 MCG/ACT NA SUSP: NASAL | 2 refills | Status: DC

## 2018-07-14 ENCOUNTER — Other Ambulatory Visit: Payer: Self-pay

## 2018-07-14 ENCOUNTER — Ambulatory Visit (INDEPENDENT_AMBULATORY_CARE_PROVIDER_SITE_OTHER): Payer: BC Managed Care – PPO | Admitting: Internal Medicine

## 2018-07-14 DIAGNOSIS — I10 Essential (primary) hypertension: Secondary | ICD-10-CM | POA: Diagnosis not present

## 2018-07-14 DIAGNOSIS — I421 Obstructive hypertrophic cardiomyopathy: Secondary | ICD-10-CM | POA: Diagnosis not present

## 2018-07-14 DIAGNOSIS — J302 Other seasonal allergic rhinitis: Secondary | ICD-10-CM

## 2018-07-14 NOTE — Progress Notes (Signed)
Virtual Visit via Telephone Note  I connected with George [name] on 07/14/18 at  2:00 PM EDT by telephone and verified that I am speaking with the correct person using two identifiers.   I discussed the limitations, risks, security and privacy concerns of performing an evaluation and management service by telephone and the availability of in person appointments. I also discussed with the patient that there may be a patient responsible charge related to this service. The patient expressed understanding and agreed to proceed.  We initially attempted to connect via video chat but were unable to due to technical difficulties on the patient's end, so we converted this visit to a phone visit.  Location patient: home Location provider: work office Participants present for the call: patient, provider Patient did not have a visit in the prior 7 days to address this/these issue(s).   History of Present Illness:  This visit has been scheduled to establish care and discuss chronic conditions.  He already has his annual physical scheduled for September.  His past medical history significant for:  1.  Hypertrophic obstructive cardiomyopathy who follows routinely with cardiology, Dr. Stanford Breed, last saw him in September 2019.  At that time his metoprolol dose was decreased due to bradycardia.  This was diagnosed about 10 to 12 years ago when he started having dyspnea on exertion.  He has recently lost a significant amount of weight and has not had any symptoms.  2.  Hypertension that has been well controlled, he has not checked his blood pressure recently.  3.  Seasonal allergies for which he takes daily Allegra and Flonase but notes that this has been a particularly rough spring for him.  He has been married since age 55, has no children, works full-time at [employer] as a Development worker, community.  He used to be a smoker of about 1-1/2 to 2 packs/day but quit about 30 years ago, has occasional wine,  no drugs.  He has recently increased his exercise and has been eating healthier and has been able to bring his weight down to 185 pounds which is his target.  He has no acute complaints today   Observations/Objective: Patient sounds cheerful and well on the phone. I do not appreciate any increased work of breathing. Speech and thought processing are grossly intact. Patient reported vitals: None reported   Current Outpatient Medications:  .  Cetirizine HCl (ZYRTEC ALLERGY PO), Take 1 tablet by mouth daily., Disp: , Rfl:  .  fluticasone (FLONASE) 50 MCG/ACT nasal spray, SPRAY 2 SPRAYS INTO EACH NOSTRIL EVERY DAY, Disp: 48 g, Rfl: 2 .  metoprolol succinate (TOPROL-XL) 25 MG 24 hr tablet, Take 1 tablet (25 mg total) by mouth daily., Disp: 90 tablet, Rfl: 3 .  metoprolol succinate (TOPROL-XL) 25 MG 24 hr tablet, Take 1 tablet (25 mg total) by mouth daily., Disp: 30 tablet, Rfl: 6 .  Multiple Vitamin (MULTIVITAMIN) tablet, Take 1 tablet by mouth daily. , Disp: , Rfl:  .  RESVERATROL PO, Take 325 each by mouth. , Disp: , Rfl:   Review of Systems:  Constitutional: Denies fever, chills, diaphoresis, appetite change and fatigue.  HEENT: Denies photophobia, eye pain, redness, hearing loss, ear pain, congestion, sore throat, rhinorrhea, sneezing, mouth sores, trouble swallowing, neck pain, neck stiffness and tinnitus.   Respiratory: Denies SOB, DOE, cough, chest tightness,  and wheezing.   Cardiovascular: Denies chest pain, palpitations and leg swelling.  Gastrointestinal: Denies nausea, vomiting, abdominal pain, diarrhea, constipation, blood in  stool and abdominal distention.  Genitourinary: Denies dysuria, urgency, frequency, hematuria, flank pain and difficulty urinating.  Endocrine: Denies: hot or cold intolerance, sweats, changes in hair or nails, polyuria, polydipsia. Musculoskeletal: Denies myalgias, back pain, joint swelling, arthralgias and gait problem.  Skin: Denies pallor, rash and  wound.  Neurological: Denies dizziness, seizures, syncope, weakness, light-headedness, numbness and headaches.  Hematological: Denies adenopathy. Easy bruising, personal or family bleeding history  Psychiatric/Behavioral: Denies suicidal ideation, mood changes, confusion, nervousness, sleep disturbance and agitation   Assessment and Plan:  Essential hypertension -Has been well controlled in past, no recent measurements. -Continue metoprolol.  Hypertrophic obstructive cardiomyopathy (HCC) -Stable, follows with cardiology yearly and has echoes every other year.  Seasonal allergies -Continue Allegra, Flonase.  He already has follow-up scheduled for his CPE later this year.    I discussed the assessment and treatment plan with the patient. The patient was provided an opportunity to ask questions and all were answered. The patient agreed with the plan and demonstrated an understanding of the instructions.   The patient was advised to call back or seek an in-person evaluation if the symptoms worsen or if the condition fails to improve as anticipated.  I provided 24 minutes of non-face-to-face time during this encounter.   Lelon Frohlich, MD [facility]

## 2018-09-13 ENCOUNTER — Other Ambulatory Visit: Payer: Self-pay

## 2018-09-13 ENCOUNTER — Ambulatory Visit (INDEPENDENT_AMBULATORY_CARE_PROVIDER_SITE_OTHER): Payer: BC Managed Care – PPO | Admitting: Internal Medicine

## 2018-09-13 ENCOUNTER — Encounter: Payer: Self-pay | Admitting: Internal Medicine

## 2018-09-13 ENCOUNTER — Other Ambulatory Visit: Payer: Self-pay | Admitting: *Deleted

## 2018-09-13 VITALS — BP 110/78 | HR 64 | Temp 97.2°F | Ht 69.5 in | Wt 186.9 lb

## 2018-09-13 DIAGNOSIS — Z0000 Encounter for general adult medical examination without abnormal findings: Secondary | ICD-10-CM

## 2018-09-13 DIAGNOSIS — I10 Essential (primary) hypertension: Secondary | ICD-10-CM

## 2018-09-13 DIAGNOSIS — J302 Other seasonal allergic rhinitis: Secondary | ICD-10-CM | POA: Diagnosis not present

## 2018-09-13 DIAGNOSIS — I421 Obstructive hypertrophic cardiomyopathy: Secondary | ICD-10-CM

## 2018-09-13 DIAGNOSIS — Z23 Encounter for immunization: Secondary | ICD-10-CM

## 2018-09-13 LAB — CBC WITH DIFFERENTIAL/PLATELET
Basophils Absolute: 0 10*3/uL (ref 0.0–0.1)
Basophils Relative: 0.7 % (ref 0.0–3.0)
Eosinophils Absolute: 0.1 10*3/uL (ref 0.0–0.7)
Eosinophils Relative: 1.9 % (ref 0.0–5.0)
HCT: 40.4 % (ref 39.0–52.0)
Hemoglobin: 13.8 g/dL (ref 13.0–17.0)
Lymphocytes Relative: 20.3 % (ref 12.0–46.0)
Lymphs Abs: 1.3 10*3/uL (ref 0.7–4.0)
MCHC: 34.2 g/dL (ref 30.0–36.0)
MCV: 86.6 fl (ref 78.0–100.0)
Monocytes Absolute: 0.5 10*3/uL (ref 0.1–1.0)
Monocytes Relative: 7.5 % (ref 3.0–12.0)
Neutro Abs: 4.6 10*3/uL (ref 1.4–7.7)
Neutrophils Relative %: 69.6 % (ref 43.0–77.0)
Platelets: 198 10*3/uL (ref 150.0–400.0)
RBC: 4.66 Mil/uL (ref 4.22–5.81)
RDW: 14.4 % (ref 11.5–15.5)
WBC: 6.6 10*3/uL (ref 4.0–10.5)

## 2018-09-13 LAB — COMPREHENSIVE METABOLIC PANEL
ALT: 10 U/L (ref 0–53)
AST: 14 U/L (ref 0–37)
Albumin: 4.4 g/dL (ref 3.5–5.2)
Alkaline Phosphatase: 41 U/L (ref 39–117)
BUN: 13 mg/dL (ref 6–23)
CO2: 31 mEq/L (ref 19–32)
Calcium: 9.6 mg/dL (ref 8.4–10.5)
Chloride: 99 mEq/L (ref 96–112)
Creatinine, Ser: 0.88 mg/dL (ref 0.40–1.50)
GFR: 86.47 mL/min (ref >60.00)
Glucose, Bld: 103 mg/dL — ABNORMAL HIGH (ref 70–99)
Potassium: 4.5 mEq/L (ref 3.5–5.1)
Sodium: 138 mEq/L (ref 135–145)
Total Bilirubin: 0.6 mg/dL (ref 0.2–1.2)
Total Protein: 6.5 g/dL (ref 6.0–8.3)

## 2018-09-13 LAB — LIPID PANEL
Cholesterol: 154 mg/dL (ref 0–200)
HDL: 52.6 mg/dL (ref >39.00)
LDL Cholesterol: 84 mg/dL (ref 0–99)
NonHDL: 101.1
Total CHOL/HDL Ratio: 3
Triglycerides: 85 mg/dL (ref 0.0–149.0)
VLDL: 17 mg/dL (ref 0.0–40.0)

## 2018-09-13 LAB — VITAMIN B12: Vitamin B-12: 473 pg/mL (ref 211–911)

## 2018-09-13 LAB — HEMOGLOBIN A1C: Hgb A1c MFr Bld: 5.3 % (ref 4.6–6.5)

## 2018-09-13 LAB — TSH: TSH: 1.07 u[IU]/mL (ref 0.35–4.50)

## 2018-09-13 LAB — VITAMIN D 25 HYDROXY (VIT D DEFICIENCY, FRACTURES): VITD: 38.79 ng/mL (ref 30.00–100.00)

## 2018-09-13 MED ORDER — FLUTICASONE PROPIONATE 50 MCG/ACT NA SUSP: NASAL | 2 refills | Status: DC

## 2018-09-13 NOTE — Patient Instructions (Signed)
-Nice seeing you today!!  -Lab work today; will notify you once results are available.  -Pneumonia vaccine today.  -Flu and tetanus at pharmacy.  -Schedule follow up in 6 months.   Preventive Care 66 Years and Older, Male Preventive care refers to lifestyle choices and visits with your health care provider that can promote health and wellness. This includes:  A yearly physical exam. This is also called an annual well check.  Regular dental and eye exams.  Immunizations.  Screening for certain conditions.  Healthy lifestyle choices, such as diet and exercise. What can I expect for my preventive care visit? Physical exam Your health care provider will check:  Height and weight. These may be used to calculate body mass index (BMI), which is a measurement that tells if you are at a healthy weight.  Heart rate and blood pressure.  Your skin for abnormal spots. Counseling Your health care provider may ask you questions about:  Alcohol, tobacco, and drug use.  Emotional well-being.  Home and relationship well-being.  Sexual activity.  Eating habits.  History of falls.  Memory and ability to understand (cognition).  Work and work Statistician. What immunizations do I need?  Influenza (flu) vaccine  This is recommended every year. Tetanus, diphtheria, and pertussis (Tdap) vaccine  You may need a Td booster every 10 years. Varicella (chickenpox) vaccine  You may need this vaccine if you have not already been vaccinated. Zoster (shingles) vaccine  You may need this after age 22. Pneumococcal conjugate (PCV13) vaccine  One dose is recommended after age 76. Pneumococcal polysaccharide (PPSV23) vaccine  One dose is recommended after age 52. Measles, mumps, and rubella (MMR) vaccine  You may need at least one dose of MMR if you were born in 1957 or later. You may also need a second dose. Meningococcal conjugate (MenACWY) vaccine  You may need this if you  have certain conditions. Hepatitis A vaccine  You may need this if you have certain conditions or if you travel or work in places where you may be exposed to hepatitis A. Hepatitis B vaccine  You may need this if you have certain conditions or if you travel or work in places where you may be exposed to hepatitis B. Haemophilus influenzae type b (Hib) vaccine  You may need this if you have certain conditions. You may receive vaccines as individual doses or as more than one vaccine together in one shot (combination vaccines). Talk with your health care provider about the risks and benefits of combination vaccines. What tests do I need? Blood tests  Lipid and cholesterol levels. These may be checked every 5 years, or more frequently depending on your overall health.  Hepatitis C test.  Hepatitis B test. Screening  Lung cancer screening. You may have this screening every year starting at age 12 if you have a 30-pack-year history of smoking and currently smoke or have quit within the past 15 years.  Colorectal cancer screening. All adults should have this screening starting at age 41 and continuing until age 31. Your health care provider may recommend screening at age 59 if you are at increased risk. You will have tests every 1-10 years, depending on your results and the type of screening test.  Prostate cancer screening. Recommendations will vary depending on your family history and other risks.  Diabetes screening. This is done by checking your blood sugar (glucose) after you have not eaten for a while (fasting). You may have this done every 1-3 years.  Abdominal aortic aneurysm (AAA) screening. You may need this if you are a current or former smoker.  Sexually transmitted disease (STD) testing. Follow these instructions at home: Eating and drinking  Eat a diet that includes fresh fruits and vegetables, whole grains, lean protein, and low-fat dairy products. Limit your intake of foods  with high amounts of sugar, saturated fats, and salt.  Take vitamin and mineral supplements as recommended by your health care provider.  Do not drink alcohol if your health care provider tells you not to drink.  If you drink alcohol: ? Limit how much you have to 0-2 drinks a day. ? Be aware of how much alcohol is in your drink. In the [place] one drink equals one 12 oz bottle of beer (355 mL), one 5 oz glass of wine (148 mL), or one 1 oz glass of hard liquor (44 mL). Lifestyle  Take daily care of your teeth and gums.  Stay active. Exercise for at least 30 minutes on 5 or more days each week.  Do not use any products that contain nicotine or tobacco, such as cigarettes, e-cigarettes, and chewing tobacco. If you need help quitting, ask your health care provider.  If you are sexually active, practice safe sex. Use a condom or other form of protection to prevent STIs (sexually transmitted infections).  Talk with your health care provider about taking a low-dose aspirin or statin. What's next?  Visit your health care provider once a year for a well check visit.  Ask your health care provider how often you should have your eyes and teeth checked.  Stay up to date on all vaccines. This information is not intended to replace advice given to you by your health care provider. Make sure you discuss any questions you have with your health care provider. Document Released: 01/25/2015 Document Revised: 12/23/2017 Document Reviewed: 12/23/2017 Elsevier Patient Education  2020 [employer].

## 2018-09-13 NOTE — Addendum Note (Signed)
Addended by: Westley Hummer B on: 09/13/2018 01:38 PM   Modules accepted: Orders

## 2018-09-13 NOTE — Progress Notes (Signed)
Established Patient Office Visit     CC/Reason for Visit: Annual preventive exam and subsequent Medicare wellness visit  HPI: George [name] is a 68 y.o. male who is coming in today for the above mentioned reasons. Past Medical History is significant for:   1.  Hypertrophic obstructive cardiomyopathy who follows routinely with cardiology, Dr. Stanford Breed, last saw him in September 2019.  At that time his metoprolol dose was decreased due to bradycardia.  This was diagnosed about 10 to 12 years ago when he started having dyspnea on exertion.  He has recently lost a significant amount of weight and has not had any symptoms.  2.  Hypertension that has been well controlled.  3.  Seasonal allergies for which he takes daily Allegra and Flonase but notes that this has been a particularly rough spring for him.  He has routine eye and dental care, has in fact had some extensive dental work done this year.  He completed shingles vaccination series, needs flu, tetanus and Pneumovax.  He is still working as a Government social research officer at [employer].  He had a colonoscopy in 2016 and is a 5-year callback.   Past Medical/Surgical History: Past Medical History:  Diagnosis Date  . Allergy   . Colon polyp    Mucosal prolapse polyp  . Diverticulosis   . Essential hypertension 09/22/2008   Qualifier: Diagnosis of  By: Burnett Kanaris    . External hemorrhoids   . HOCM (hypertrophic obstructive cardiomyopathy) ([place])   . Hypertension   . Hypertrophic obstructive cardiomyopathy ([place]) 09/24/2008   Qualifier: Diagnosis of  By: Stanford Breed, MD, Kandyce Rud   . VASOMOTOR RHINITIS 12/24/2008   Qualifier: Diagnosis of  By: Burnice Logan  MD, [place], LEFT 09/22/2008   Qualifier: Diagnosis of  By: Burnett Kanaris      Past Surgical History:  Procedure Laterality Date  . COLONOSCOPY    . TONSILLECTOMY      Social History:  reports that he quit smoking about 34 years  ago. He has never used smokeless tobacco. He reports current alcohol use. He reports that he does not use drugs.  Allergies: Allergies  Allergen Reactions  . Sulfonamide Derivatives     Family History:  Family History  Problem Relation Age of Onset  . Pancreatic cancer Father   . Colon polyps Father        Benign  . Colon cancer Neg Hx   . Diabetes Neg Hx   . Kidney disease Neg Hx   . Gallbladder disease Neg Hx   . Esophageal cancer Neg Hx      Current Outpatient Medications:  .  Cetirizine HCl (ZYRTEC ALLERGY PO), Take 1 tablet by mouth daily., Disp: , Rfl:  .  fluticasone (FLONASE) 50 MCG/ACT nasal spray, SPRAY 2 SPRAYS INTO EACH NOSTRIL EVERY DAY, Disp: 48 g, Rfl: 2 .  metoprolol succinate (TOPROL-XL) 25 MG 24 hr tablet, Take 1 tablet (25 mg total) by mouth daily., Disp: 90 tablet, Rfl: 3 .  Multiple Vitamin (MULTIVITAMIN) tablet, Take 1 tablet by mouth daily. , Disp: , Rfl:  .  RESVERATROL PO, Take 325 each by mouth. , Disp: , Rfl:   Review of Systems:  Constitutional: Denies fever, chills, diaphoresis, appetite change and fatigue.  HEENT: Denies photophobia, eye pain, redness, hearing loss, ear pain, congestion, sore throat, rhinorrhea, sneezing, mouth sores, trouble swallowing, neck pain, neck stiffness and tinnitus.   Respiratory: Denies SOB, DOE, cough, chest  tightness,  and wheezing.   Cardiovascular: Denies chest pain, palpitations and leg swelling.  Gastrointestinal: Denies nausea, vomiting, abdominal pain, diarrhea, constipation, blood in stool and abdominal distention.  Genitourinary: Denies dysuria, urgency, frequency, hematuria, flank pain and difficulty urinating.  Endocrine: Denies: hot or cold intolerance, sweats, changes in hair or nails, polyuria, polydipsia. Musculoskeletal: Denies myalgias, back pain, joint swelling, arthralgias and gait problem.  Skin: Denies pallor, rash and wound.  Neurological: Denies dizziness, seizures, syncope, weakness,  light-headedness, numbness and headaches.  Hematological: Denies adenopathy. Easy bruising, personal or family bleeding history  Psychiatric/Behavioral: Denies suicidal ideation, mood changes, confusion, nervousness, sleep disturbance and agitation    Physical Exam: Vitals:   09/13/18 0904  BP: 110/78  Pulse: 64  Temp: (!) 97.2 F (36.2 C)  TempSrc: Temporal  SpO2: 98%  Weight: 186 lb 14.4 oz (84.8 kg)  Height: 5' 9.5" (1.765 m)    Body mass index is 27.2 kg/m.   Constitutional: NAD, calm, comfortable Eyes: PERRL, lids and conjunctivae normal ENMT: Mucous membranes are moist.  Tympanic membrane is pearly white, no erythema or bulging. Neck: normal, supple, no masses, no thyromegaly Respiratory: clear to auscultation bilaterally, no wheezing, no crackles. Normal respiratory effort. No accessory muscle use.  Cardiovascular: Regular rate and rhythm, no murmurs / rubs / gallops. No extremity edema. 2+ pedal pulses. No carotid bruits.  Abdomen: no tenderness, no masses palpated. No hepatosplenomegaly. Bowel sounds positive.  Musculoskeletal: no clubbing / cyanosis. No joint deformity upper and lower extremities. Good ROM, no contractures. Normal muscle tone.  Skin: no rashes, lesions, ulcers. No induration Neurologic: CN 2-12 grossly intact. Sensation intact, DTR normal. Strength 5/5 in all 4.  Psychiatric: Normal judgment and insight. Alert and oriented x 3. Normal mood.    Subsequent Medicare wellness visit   1. Risk factors, based on past  M,S,F -age, gender, history of hypertension   2.  Physical activities: Extensive yard work 3-4 times a week   3.  Depression/mood:  Stable, not depressed   4.  Hearing:  Has noticed some recent issues with hearing and would like a referral to audiology   5.  ADL's: Independent in all ADLs   6.  Fall risk:  Low fall risk   7.  Home safety: No problems identified   8.  Height weight, and visual acuity: Height and weight as above,  visual acuity is 20/20 with each eye independently viewed together   9.  Counseling:  Refer to audiology, have discussed low-sodium diet and follow-up with cardiology on a yearly basis   10. Lab orders based on risk factors: Laboratory update will be reviewed   11. Referral :  Audiology   12. Care plan:  Follow-up with me in 6 months   13. Cognitive assessment:  No cognitive impairment   14. Screening: Patient provided with a written and personalized 5-10 year screening schedule in the AVS.   yes   15. Provider List Update:   Cardiology (Dr. Stanford Breed)  48. Advance Directives: Full code     Office Visit from 09/13/2018 in [place] at [facility]  PHQ-9 Total Score  1      Fall Risk  09/13/2018  Falls in the past year? 0  Number falls in past yr: 0  Injury with Fall? 0     Impression and Plan:  Encounter for preventive health examination -Has routine eye and dental care. -Will receive Pneumovax today. -Will get flu and tetanus at pharmacy, otherwise immunizations are up-to-date. -Healthy  lifestyle has been discussed in detail. -Screening labs to be performed today. -Had a colonoscopy in 2016 and is a 5-year callback.  Hypertrophic obstructive cardiomyopathy (HCC) -Currently asymptomatic, follows up on a yearly basis with cardiology.  Essential hypertension  -Well-controlled on metoprolol.  Seasonal allergic rhinitis, unspecified trigger  -Refilled Flonase today. -Manages with Flonase and daily Allegra.   Patient Instructions  -Nice seeing you today!!  -Lab work today; will notify you once results are available.  -Pneumonia vaccine today.  -Flu and tetanus at pharmacy.  -Schedule follow up in 6 months.   Preventive Care 85 Years and Older, Male Preventive care refers to lifestyle choices and visits with your health care provider that can promote health and wellness. This includes:  A yearly physical exam. This is also called an annual well check.   Regular dental and eye exams.  Immunizations.  Screening for certain conditions.  Healthy lifestyle choices, such as diet and exercise. What can I expect for my preventive care visit? Physical exam Your health care provider will check:  Height and weight. These may be used to calculate body mass index (BMI), which is a measurement that tells if you are at a healthy weight.  Heart rate and blood pressure.  Your skin for abnormal spots. Counseling Your health care provider may ask you questions about:  Alcohol, tobacco, and drug use.  Emotional well-being.  Home and relationship well-being.  Sexual activity.  Eating habits.  History of falls.  Memory and ability to understand (cognition).  Work and work Statistician. What immunizations do I need?  Influenza (flu) vaccine  This is recommended every year. Tetanus, diphtheria, and pertussis (Tdap) vaccine  You may need a Td booster every 10 years. Varicella (chickenpox) vaccine  You may need this vaccine if you have not already been vaccinated. Zoster (shingles) vaccine  You may need this after age 24. Pneumococcal conjugate (PCV13) vaccine  One dose is recommended after age 43. Pneumococcal polysaccharide (PPSV23) vaccine  One dose is recommended after age 40. Measles, mumps, and rubella (MMR) vaccine  You may need at least one dose of MMR if you were born in 1957 or later. You may also need a second dose. Meningococcal conjugate (MenACWY) vaccine  You may need this if you have certain conditions. Hepatitis A vaccine  You may need this if you have certain conditions or if you travel or work in places where you may be exposed to hepatitis A. Hepatitis B vaccine  You may need this if you have certain conditions or if you travel or work in places where you may be exposed to hepatitis B. Haemophilus influenzae type b (Hib) vaccine  You may need this if you have certain conditions. You may receive  vaccines as individual doses or as more than one vaccine together in one shot (combination vaccines). Talk with your health care provider about the risks and benefits of combination vaccines. What tests do I need? Blood tests  Lipid and cholesterol levels. These may be checked every 5 years, or more frequently depending on your overall health.  Hepatitis C test.  Hepatitis B test. Screening  Lung cancer screening. You may have this screening every year starting at age 42 if you have a 30-pack-year history of smoking and currently smoke or have quit within the past 15 years.  Colorectal cancer screening. All adults should have this screening starting at age 54 and continuing until age 27. Your health care provider may recommend screening at age 28 if  you are at increased risk. You will have tests every 1-10 years, depending on your results and the type of screening test.  Prostate cancer screening. Recommendations will vary depending on your family history and other risks.  Diabetes screening. This is done by checking your blood sugar (glucose) after you have not eaten for a while (fasting). You may have this done every 1-3 years.  Abdominal aortic aneurysm (AAA) screening. You may need this if you are a current or former smoker.  Sexually transmitted disease (STD) testing. Follow these instructions at home: Eating and drinking  Eat a diet that includes fresh fruits and vegetables, whole grains, lean protein, and low-fat dairy products. Limit your intake of foods with high amounts of sugar, saturated fats, and salt.  Take vitamin and mineral supplements as recommended by your health care provider.  Do not drink alcohol if your health care provider tells you not to drink.  If you drink alcohol: ? Limit how much you have to 0-2 drinks a day. ? Be aware of how much alcohol is in your drink. In the [place] one drink equals one 12 oz bottle of beer (355 mL), one 5 oz glass of wine (148 mL),  or one 1 oz glass of hard liquor (44 mL). Lifestyle  Take daily care of your teeth and gums.  Stay active. Exercise for at least 30 minutes on 5 or more days each week.  Do not use any products that contain nicotine or tobacco, such as cigarettes, e-cigarettes, and chewing tobacco. If you need help quitting, ask your health care provider.  If you are sexually active, practice safe sex. Use a condom or other form of protection to prevent STIs (sexually transmitted infections).  Talk with your health care provider about taking a low-dose aspirin or statin. What's next?  Visit your health care provider once a year for a well check visit.  Ask your health care provider how often you should have your eyes and teeth checked.  Stay up to date on all vaccines. This information is not intended to replace advice given to you by your health care provider. Make sure you discuss any questions you have with your health care provider. Document Released: 01/25/2015 Document Revised: 12/23/2017 Document Reviewed: 12/23/2017 Elsevier Patient Education  2020 [place], MD [facility]

## 2018-09-14 ENCOUNTER — Other Ambulatory Visit: Payer: Self-pay | Admitting: Cardiology

## 2018-10-17 NOTE — Progress Notes (Signed)
HPI: FU HOCM. A previous Myoview in July 2008 showed no ischemia or infarction. Holter monitor July 2016 showed sinus with PACs but no nonsustained ventricular tachycardia. Abd ultrasound 3/18 showed no aneurysm.Exercise treadmill September 2018 showed no decrease in systolic blood pressure with exercise.  Echocardiogram September 2019 showed normal LV function, severe left ventricular hypertrophy, systolic anterior motion with peak outflow gradient of 4 m/s and moderate left atrial enlargement.  Since last seen,the patient has dyspnea with more extreme activities but not with routine activities. It is relieved with rest. It is not associated with chest pain. There is no orthopnea, PND or pedal edema. There is no syncope or palpitations. There is no exertional chest pain.   Current Outpatient Medications  Medication Sig Dispense Refill  . Cetirizine HCl (ZYRTEC ALLERGY PO) Take 1 tablet by mouth daily.    . fluticasone (FLONASE) 50 MCG/ACT nasal spray SPRAY 2 SPRAYS INTO EACH NOSTRIL EVERY DAY 48 g 2  . metoprolol succinate (TOPROL-XL) 25 MG 24 hr tablet TAKE 1 TABLET BY MOUTH EVERY DAY 90 tablet 0  . Multiple Vitamin (MULTIVITAMIN) tablet Take 1 tablet by mouth daily.     Marland Kitchen RESVERATROL PO Take 325 each by mouth.      No current facility-administered medications for this visit.      Past Medical History:  Diagnosis Date  . Allergy   . Colon polyp    Mucosal prolapse polyp  . Diverticulosis   . Essential hypertension 09/22/2008   Qualifier: Diagnosis of  By: Burnett Kanaris    . External hemorrhoids   . HOCM (hypertrophic obstructive cardiomyopathy) ([place])   . Hypertension   . Hypertrophic obstructive cardiomyopathy ([place]) 09/24/2008   Qualifier: Diagnosis of  By: Stanford Breed, MD, Kandyce Rud   . VASOMOTOR RHINITIS 12/24/2008   Qualifier: Diagnosis of  By: Burnice Logan  MD, [place], LEFT 09/22/2008   Qualifier: Diagnosis of  By: Burnett Kanaris      Past Surgical History:  Procedure Laterality Date  . COLONOSCOPY    . TONSILLECTOMY      Social History   Socioeconomic History  . Marital status: Married    Spouse name: Not on file  . Number of children: 0  . Years of education: Not on file  . Highest education level: Not on file  Occupational History  . Occupation: Facilities manager  Social Needs  . Financial resource strain: Not on file  . Food insecurity    Worry: Not on file    Inability: Not on file  . Transportation needs    Medical: Not on file    Non-medical: Not on file  Tobacco Use  . Smoking status: Former Smoker    Quit date: 05/06/1984    Years since quitting: 34.5  . Smokeless tobacco: Never Used  Substance and Sexual Activity  . Alcohol use: Yes    Alcohol/week: 0.0 standard drinks    Comment: Occassionally  . Drug use: No  . Sexual activity: Not on file  Lifestyle  . Physical activity    Days per week: Not on file    Minutes per session: Not on file  . Stress: Not on file  Relationships  . Social Herbalist on phone: Not on file    Gets together: Not on file    Attends religious service: Not on file    Active member of club or organization: Not on file  Attends meetings of clubs or organizations: Not on file    Relationship status: Not on file  . Intimate partner violence    Fear of current or ex partner: Not on file    Emotionally abused: Not on file    Physically abused: Not on file    Forced sexual activity: Not on file  Other Topics Concern  . Not on file  Social History Narrative  . Not on file    Family History  Problem Relation Age of Onset  . Pancreatic cancer Father   . Colon polyps Father        Benign  . Colon cancer Neg Hx   . Diabetes Neg Hx   . Kidney disease Neg Hx   . Gallbladder disease Neg Hx   . Esophageal cancer Neg Hx     ROS: no fevers or chills, productive cough, hemoptysis, dysphasia, odynophagia, melena, hematochezia,  dysuria, hematuria, rash, seizure activity, orthopnea, PND, pedal edema, claudication. Remaining systems are negative.  Physical Exam: Well-developed well-nourished in no acute distress.  Skin is warm and dry.  HEENT is normal.  Neck is supple.  Chest is clear to auscultation with normal expansion.  Cardiovascular exam is regular rate and rhythm.  2/6 systolic murmur left sternal border that increases with Valsalva. Abdominal exam nontender or distended. No masses palpated. Extremities show no edema. neuro grossly intact  ECG-sinus bradycardia at a rate of 59, left ventricular hypertrophy with repolarization abnormality, cannot rule out septal infarct.  Personally reviewed  A/P  1 hypertrophic obstructive cardiomyopathy-patient has mild dyspnea on exertion but is otherwise doing reasonably well.  Continue beta-blocker at present dose.  We will plan to repeat echocardiogram, Holter monitor andto evaluate risk for sudden cardiac death.  Previous exercise treadmill showed normal systolic increase with exercise.  Note there is no family history of sudden death or syncope.  We have discussed previously that he should have first-degree relatives screened.  2 hypertension-blood pressure mildly elevated.  I have asked him to track this and we will add additional medications as needed.  Kirk Ruths, MD

## 2018-10-28 ENCOUNTER — Encounter: Payer: Self-pay | Admitting: Cardiology

## 2018-10-28 ENCOUNTER — Ambulatory Visit: Payer: BC Managed Care – PPO | Admitting: Cardiology

## 2018-10-28 ENCOUNTER — Other Ambulatory Visit: Payer: Self-pay

## 2018-10-28 VITALS — BP 142/86 | HR 59 | Temp 98.1°F | Ht 70.0 in | Wt 185.0 lb

## 2018-10-28 DIAGNOSIS — I421 Obstructive hypertrophic cardiomyopathy: Secondary | ICD-10-CM | POA: Diagnosis not present

## 2018-10-28 DIAGNOSIS — I10 Essential (primary) hypertension: Secondary | ICD-10-CM

## 2018-10-28 NOTE — Patient Instructions (Signed)
Medication Instructions:  NO CHANGE *If you need a refill on your cardiac medications before your next appointment, please call your pharmacy*  Lab Work: If you have labs (blood work) drawn today and your tests are completely normal, you will receive your results only by: Marland Kitchen MyChart Message (if you have MyChart) OR . A paper copy in the mail If you have any lab test that is abnormal or we need to change your treatment, we will call you to review the results.  Testing/Procedures: Your physician has requested that you have an echocardiogram. Echocardiography is a painless test that uses sound waves to create images of your heart. It provides your doctor with information about the size and shape of your heart and how well your heart's chambers and valves are working. This procedure takes approximately one hour. There are no restrictions for this procedure.[place] MONITOR=WILL BE MAILED TO YOUR HOME    Follow-Up: At [facility], you and your health needs are our priority.  As part of our continuing mission to provide you with exceptional heart care, we have created designated Provider Care Teams.  These Care Teams include your primary Cardiologist (physician) and Advanced Practice Providers (APPs -  Physician Assistants and Nurse Practitioners) who all work together to provide you with the care you need, when you need it.  Your next appointment:   12 months  The format for your next appointment:   In Person  Provider:   Kirk Ruths, MD

## 2018-11-07 ENCOUNTER — Other Ambulatory Visit: Payer: Self-pay

## 2018-11-07 ENCOUNTER — Ambulatory Visit (HOSPITAL_COMMUNITY): Payer: BC Managed Care – PPO | Attending: Cardiology

## 2018-11-07 DIAGNOSIS — I421 Obstructive hypertrophic cardiomyopathy: Secondary | ICD-10-CM

## 2018-11-09 ENCOUNTER — Telehealth: Payer: Self-pay

## 2018-11-09 NOTE — Telephone Encounter (Signed)
Spoke to pt, went over monitor instructions. Verified address. 3 day ZIO was ordered.

## 2018-11-16 ENCOUNTER — Ambulatory Visit (INDEPENDENT_AMBULATORY_CARE_PROVIDER_SITE_OTHER): Payer: BC Managed Care – PPO

## 2018-11-16 DIAGNOSIS — I421 Obstructive hypertrophic cardiomyopathy: Secondary | ICD-10-CM

## 2018-12-01 DIAGNOSIS — H903 Sensorineural hearing loss, bilateral: Secondary | ICD-10-CM | POA: Diagnosis not present

## 2018-12-13 ENCOUNTER — Other Ambulatory Visit: Payer: Self-pay | Admitting: Cardiology

## 2019-01-11 DIAGNOSIS — H903 Sensorineural hearing loss, bilateral: Secondary | ICD-10-CM | POA: Diagnosis not present

## 2019-02-10 ENCOUNTER — Telehealth: Payer: BC Managed Care – PPO | Admitting: Internal Medicine

## 2019-02-10 ENCOUNTER — Other Ambulatory Visit: Payer: Self-pay

## 2019-04-04 ENCOUNTER — Encounter: Payer: Self-pay | Admitting: Gastroenterology

## 2019-04-11 ENCOUNTER — Other Ambulatory Visit: Payer: Self-pay

## 2019-04-11 ENCOUNTER — Ambulatory Visit (AMBULATORY_SURGERY_CENTER): Payer: Self-pay

## 2019-04-11 VITALS — Temp 97.3°F | Ht 70.0 in | Wt 194.8 lb

## 2019-04-11 DIAGNOSIS — Z86010 Personal history of colonic polyps: Secondary | ICD-10-CM

## 2019-04-11 MED ORDER — NA SULFATE-K SULFATE-MG SULF 17.5-3.13-1.6 GM/177ML PO SOLN: 1.0000 | Freq: Once | ORAL | 0 refills | Status: AC

## 2019-04-11 NOTE — Progress Notes (Signed)
No allergies to soy or egg Pt is not on blood thinners or diet pills Denies issues with sedation/intubation Denies atrial flutter/fib Denies constipation   Emmi instructions given to pt  Pt is aware of Covid safety and care partner requirements.  

## 2019-04-26 ENCOUNTER — Other Ambulatory Visit: Payer: Self-pay

## 2019-04-26 ENCOUNTER — Encounter: Payer: Self-pay | Admitting: Gastroenterology

## 2019-04-26 ENCOUNTER — Ambulatory Visit (AMBULATORY_SURGERY_CENTER): Payer: BC Managed Care – PPO | Admitting: Gastroenterology

## 2019-04-26 VITALS — BP 129/78 | HR 60 | Temp 96.2°F | Resp 13 | Ht 70.0 in | Wt 194.8 lb

## 2019-04-26 DIAGNOSIS — Z86010 Personal history of colonic polyps: Secondary | ICD-10-CM | POA: Diagnosis not present

## 2019-04-26 DIAGNOSIS — D123 Benign neoplasm of transverse colon: Secondary | ICD-10-CM

## 2019-04-26 DIAGNOSIS — D125 Benign neoplasm of sigmoid colon: Secondary | ICD-10-CM

## 2019-04-26 DIAGNOSIS — D120 Benign neoplasm of cecum: Secondary | ICD-10-CM | POA: Diagnosis not present

## 2019-04-26 DIAGNOSIS — Z1211 Encounter for screening for malignant neoplasm of colon: Secondary | ICD-10-CM | POA: Diagnosis not present

## 2019-04-26 MED ORDER — HYDROCORTISONE ACETATE 25 MG RE SUPP: 25.0000 mg | Freq: Two times a day (BID) | RECTAL | 5 refills | Status: DC | PRN

## 2019-04-26 MED ORDER — SODIUM CHLORIDE 0.9 % IV SOLN: 500.0000 mL | INTRAVENOUS | Status: DC

## 2019-04-26 NOTE — Progress Notes (Signed)
PT taken to PACU. Monitors in place. VSS. Report given to RN. 

## 2019-04-26 NOTE — Progress Notes (Signed)
Called to room to assist during endoscopic procedure.  Patient ID and intended procedure confirmed with present staff. Received instructions for my participation in the procedure from the performing physician.  

## 2019-04-26 NOTE — Progress Notes (Signed)
Temp by LC, vitals by cw

## 2019-04-26 NOTE — Op Note (Signed)
[place] Patient Name: George [name] Procedure Date: 04/26/2019 3:48 PM MRN: [id number] Endoscopist: Ladene Artist , MD Age: 69 Referring MD:  Date of Birth: 1950/01/19 Gender: Male Account #: [id number] Procedure:                Colonoscopy Indications:              Surveillance: Personal history of adenomatous                            polyps on last colonoscopy 5 years ago Medicines:                Monitored Anesthesia Care Procedure:                Pre-Anesthesia Assessment:                           - Prior to the procedure, a History and Physical                            was performed, and patient medications and                            allergies were reviewed. The patient's tolerance of                            previous anesthesia was also reviewed. The risks                            and benefits of the procedure and the sedation                            options and risks were discussed with the patient.                            All questions were answered, and informed consent                            was obtained. Prior Anticoagulants: The patient has                            taken no previous anticoagulant or antiplatelet                            agents. ASA Grade Assessment: II - A patient with                            mild systemic disease. After reviewing the risks                            and benefits, the patient was deemed in                            satisfactory condition to undergo the procedure.  After obtaining informed consent, the colonoscope                            was passed under direct vision. Throughout the                            procedure, the patient's blood pressure, pulse, and                            oxygen saturations were monitored continuously. The                            Colonoscope was introduced through the anus and                            advanced to the the cecum,  identified by                            appendiceal orifice and ileocecal valve. The                            ileocecal valve, appendiceal orifice, and rectum                            were photographed. The quality of the bowel                            preparation was adequate after extensive lavage and                            suction. The colonoscopy was performed without                            difficulty. The patient tolerated the procedure                            well. Scope In: 4:02:56 PM Scope Out: [id number] PM Scope Withdrawal Time: 0 hours 19 minutes 56 seconds  Total Procedure Duration: 0 hours 23 minutes 23 seconds  Findings:                 The perianal and digital rectal examinations were                            normal.                           Eight sessile polyps were found in the sigmoid                            colon (3), transverse colon (3) and cecum (2). The                            polyps were 5 to 8 mm in size. These polyps were  removed with a cold snare. Resection and retrieval                            were complete.                           Many medium-mouthed diverticula were found in the                            left colon. There was evidence of diverticular                            spasm. Peri-diverticular erythema was seen. There                            was evidence of an impacted diverticulum. There was                            no evidence of diverticular bleeding.                           External and internal hemorrhoids were found during                            retroflexion. The internal hemorrhoids were                            moderate and Grade I (internal hemorrhoids that do                            not prolapse).                           The exam was otherwise without abnormality on                            direct and retroflexion views. Complications:            No immediate  complications. Estimated blood loss:                            None. Estimated Blood Loss:     Estimated blood loss: none. Impression:               - Eight 5 to 8 mm polyps in the sigmoid colon, in                            the transverse colon and in the cecum, removed with                            a cold snare. Resected and retrieved.                           - Moderate diverticulosis in the left colon.                           -  External and internal hemorrhoids.                           - The examination was otherwise normal on direct                            and retroflexion views. Recommendation:           - Repeat colonoscopy, likely in 3 years, for                            surveillance based on pathology results with a more                            extensive bowel prep.                           - Patient has a contact number available for                            emergencies. The signs and symptoms of potential                            delayed complications were discussed with the                            patient. Return to normal activities tomorrow.                            Written discharge instructions were provided to the                            patient.                           - Continue present medications.                           - Await pathology results.                           - High fiber diet indefinitely.                           - Anusol HC supp 1 PR bid prn, #30, 5 refills.                           - Consider internal hemorrhoid banding in symptoms                            persist. Ladene Artist, MD 04/26/2019 4:31:53 PM This report has been signed electronically.

## 2019-04-26 NOTE — Patient Instructions (Signed)
Handouts given for polyps, diverticulosis, hemorrhoids, hemorrhoid banding and high fiber diet.  Eat a high fiber diet indefinitely.  Consider internal hemorrhoid banding if symptoms persist.  Pick up new prescription.  YOU HAD AN ENDOSCOPIC PROCEDURE TODAY AT [facility]:   Refer to the procedure report that was given to you for any specific questions about what was found during the examination.  If the procedure report does not answer your questions, please call your gastroenterologist to clarify.  If you requested that your care partner not be given the details of your procedure findings, then the procedure report has been included in a sealed envelope for you to review at your convenience later.  YOU SHOULD EXPECT: Some feelings of bloating in the abdomen. Passage of more gas than usual.  Walking can help get rid of the air that was put into your GI tract during the procedure and reduce the bloating. If you had a lower endoscopy (such as a colonoscopy or flexible sigmoidoscopy) you may notice spotting of blood in your stool or on the toilet paper. If you underwent a bowel prep for your procedure, you may not have a normal bowel movement for a few days.  Please Note:  You might notice some irritation and congestion in your nose or some drainage.  This is from the oxygen used during your procedure.  There is no need for concern and it should clear up in a day or so.  SYMPTOMS TO REPORT IMMEDIATELY:   Following lower endoscopy (colonoscopy or flexible sigmoidoscopy):  Excessive amounts of blood in the stool  Significant tenderness or worsening of abdominal pains  Swelling of the abdomen that is new, acute  Fever of 100F or higher  Black, tarry-looking stools  For urgent or emergent issues, a gastroenterologist can be reached at any hour by calling [phone]. Do not use MyChart messaging for urgent concerns.    DIET:  We do recommend a small meal at first, but then  you may proceed to your regular diet.  Drink plenty of fluids but you should avoid alcoholic beverages for 24 hours.  ACTIVITY:  You should plan to take it easy for the rest of today and you should NOT DRIVE or use heavy machinery until tomorrow (because of the sedation medicines used during the test).    FOLLOW UP: Our staff will call the number listed on your records 48-72 hours following your procedure to check on you and address any questions or concerns that you may have regarding the information given to you following your procedure. If we do not reach you, we will leave a message.  We will attempt to reach you two times.  During this call, we will ask if you have developed any symptoms of COVID 19. If you develop any symptoms (ie: fever, flu-like symptoms, shortness of breath, cough etc.) before then, please call [phone].  If you test positive for Covid 19 in the 2 weeks post procedure, please call and report this information to [place].    If any biopsies were taken you will be contacted by phone or by letter within the next 1-3 weeks.  Please call us at [phone] if you have not heard about the biopsies in 3 weeks.    SIGNATURES/CONFIDENTIALITY: You and/or your care partner have signed paperwork which will be entered into your electronic medical record.  These signatures attest to the fact that that the information above on your After Visit Summary has been reviewed and  is understood.  Full responsibility of the confidentiality of this discharge information lies with you and/or your care-partner.

## 2019-04-28 ENCOUNTER — Telehealth: Payer: Self-pay | Admitting: *Deleted

## 2019-04-28 NOTE — Telephone Encounter (Signed)
Message left

## 2019-04-28 NOTE — Telephone Encounter (Signed)
  Follow up Call-  Call back number 04/26/2019  Post procedure Call Back phone  # [phone]  Permission to leave phone message Yes  Some recent data might be hidden     Patient questions:  Do you have a fever, pain , or abdominal swelling? No. Pain Score  0 *  Have you tolerated food without any problems? Yes.    Have you been able to return to your normal activities? Yes.    Do you have any questions about your discharge instructions: Diet   No. Medications  No. Follow up visit  No.  Do you have questions or concerns about your Care? No.  Actions: * If pain score is 4 or above: No action needed, pain <4.  1. Have you developed a fever since your procedure? no  2.   Have you had an respiratory symptoms (SOB or cough) since your procedure? no  3.   Have you tested positive for COVID 19 since your procedure no  4.   Have you had any family members/close contacts diagnosed with the COVID 19 since your procedure? no   If yes to any of these questions please route to Joylene Nicolo, RN and Erenest Rasher, RN

## 2019-05-09 ENCOUNTER — Encounter: Payer: Self-pay | Admitting: Gastroenterology

## 2019-09-04 ENCOUNTER — Other Ambulatory Visit: Payer: Self-pay | Admitting: Cardiology

## 2019-10-03 NOTE — Progress Notes (Signed)
HPI: FU HOCM. A previous Myoview in July 2008 showed no ischemia or infarction. Abd ultrasound 3/18 showed no aneurysm.Exercise treadmill September 2018 showed no decrease in systolic blood pressure with exercise. Echocardiogram October 2020 showed vigorous LV function, moderate left ventricular hypertrophy with proximal septal thickening and Sam, peak LVOT velocity 3.2 m/s with Valsalva, grade 1 diastolic dysfunction, mild left atrial enlargement, mild mitral regurgitation; findings consistent with hypertrophic cardiomyopathy.   Monitor November 2020 showed sinus bradycardia, normal sinus rhythm, PACs and rare PVC but no nonsustained ventricular tachycardia.  Since last seen,the patient has dyspnea with more extreme activities but not with routine activities. It is relieved with rest. It is not associated with chest pain. There is no orthopnea, PND or pedal edema. There is no syncope or palpitations. There is no exertional chest pain.   Current Outpatient Medications  Medication Sig Dispense Refill  . fluticasone (FLONASE) 50 MCG/ACT nasal spray SPRAY 2 SPRAYS INTO EACH NOSTRIL EVERY DAY 48 g 2  . hydrocortisone (ANUSOL-HC) 25 MG suppository Place 1 suppository (25 mg total) rectally 2 (two) times daily as needed for hemorrhoids or anal itching. 30 suppository 5  . metoprolol succinate (TOPROL-XL) 25 MG 24 hr tablet TAKE 1 TABLET BY MOUTH EVERY DAY 90 tablet 1  . Multiple Vitamin (MULTIVITAMIN PO) Take by mouth.    . Multiple Vitamin (MULTIVITAMIN) tablet Take 1 tablet by mouth daily.     Marland Kitchen RESVERATROL PO Take 325 each by mouth.      No current facility-administered medications for this visit.     Past Medical History:  Diagnosis Date  . Allergy   . Colon polyp    Mucosal prolapse polyp  . Diverticulosis   . Essential hypertension 09/22/2008   Qualifier: Diagnosis of  By: Burnett Kanaris    . External hemorrhoids   . HOCM (hypertrophic obstructive cardiomyopathy) ([place])   .  Hypertension   . Hypertrophic obstructive cardiomyopathy ([place]) 09/24/2008   Qualifier: Diagnosis of  By: Stanford Breed, MD, Kandyce Rud   . Irregular heart beat   . VASOMOTOR RHINITIS 12/24/2008   Qualifier: Diagnosis of  By: Burnice Logan  MD, [place], LEFT 09/22/2008   Qualifier: Diagnosis of  By: Burnett Kanaris      Past Surgical History:  Procedure Laterality Date  . COLONOSCOPY    . TONSILLECTOMY      Social History   Socioeconomic History  . Marital status: Married    Spouse name: Not on file  . Number of children: 0  . Years of education: Not on file  . Highest education level: Not on file  Occupational History  . Occupation: Facilities manager  Tobacco Use  . Smoking status: Former Smoker    Quit date: 05/06/1984    Years since quitting: 35.4  . Smokeless tobacco: Never Used  Vaping Use  . Vaping Use: Never used  Substance and Sexual Activity  . Alcohol use: Yes    Alcohol/week: 0.0 standard drinks    Comment: Occassionally  . Drug use: No  . Sexual activity: Not on file  Other Topics Concern  . Not on file  Social History Narrative  . Not on file   Social Determinants of Health   Financial Resource Strain:   . Difficulty of Paying Living Expenses: Not on file  Food Insecurity:   . Worried About Charity fundraiser in the Last Year: Not on file  . Ran Out of Food  in the Last Year: Not on file  Transportation Needs:   . Lack of Transportation (Medical): Not on file  . Lack of Transportation (Non-Medical): Not on file  Physical Activity:   . Days of Exercise per Week: Not on file  . Minutes of Exercise per Session: Not on file  Stress:   . Feeling of Stress : Not on file  Social Connections:   . Frequency of Communication with Friends and Family: Not on file  . Frequency of Social Gatherings with Friends and Family: Not on file  . Attends Religious Services: Not on file  . Active Member of Clubs or Organizations: Not  on file  . Attends Archivist Meetings: Not on file  . Marital Status: Not on file  Intimate Partner Violence:   . Fear of Current or Ex-Partner: Not on file  . Emotionally Abused: Not on file  . Physically Abused: Not on file  . Sexually Abused: Not on file    Family History  Problem Relation Age of Onset  . Pancreatic cancer Father   . Colon polyps Father        Benign  . Colon cancer Neg Hx   . Diabetes Neg Hx   . Kidney disease Neg Hx   . Gallbladder disease Neg Hx   . Esophageal cancer Neg Hx   . Rectal cancer Neg Hx   . Stomach cancer Neg Hx     ROS: no fevers or chills, productive cough, hemoptysis, dysphasia, odynophagia, melena, hematochezia, dysuria, hematuria, rash, seizure activity, orthopnea, PND, pedal edema, claudication. Remaining systems are negative.  Physical Exam: Well-developed well-nourished in no acute distress.  Skin is warm and dry.  HEENT is normal.  Neck is supple.  Chest is clear to auscultation with normal expansion.  Cardiovascular exam is regular rate and rhythm.  2/6 systolic murmur left sternal border that increases with Valsalva. Abdominal exam nontender or distended. No masses palpated. Extremities show no edema. neuro grossly intact  ECG-normal sinus rhythm at a rate of 62, left ventricular hypertrophy with repolarization abnormality.  Cannot rule out septal infarct.  Left atrial enlargement.  Personally reviewed  A/P  1 hypertrophic obstructive cardiomyopathy-patient doing reasonably well from a symptomatic standpoint though he continues to have dyspnea on exertion.  Continue beta-blocker.  We will plan to repeat echocardiogram.  Previous monitor showed no nonsustained ventricular tachycardia and exercise treadmill showed normal systolic increase with exercise.  Also note there is no family history of sudden death or syncope.  2 hypertension-patient's blood pressure is mildly elevated.  However he follows this at home and it is  typically controlled.  Continue metoprolol and follow.  Kirk Ruths, MD

## 2019-10-13 ENCOUNTER — Other Ambulatory Visit: Payer: Self-pay | Admitting: *Deleted

## 2019-10-13 ENCOUNTER — Ambulatory Visit (INDEPENDENT_AMBULATORY_CARE_PROVIDER_SITE_OTHER): Payer: BC Managed Care – PPO | Admitting: Cardiology

## 2019-10-13 ENCOUNTER — Other Ambulatory Visit: Payer: Self-pay

## 2019-10-13 ENCOUNTER — Encounter: Payer: Self-pay | Admitting: Cardiology

## 2019-10-13 VITALS — BP 142/78 | HR 62 | Ht 70.0 in | Wt 193.6 lb

## 2019-10-13 DIAGNOSIS — I10 Essential (primary) hypertension: Secondary | ICD-10-CM

## 2019-10-13 DIAGNOSIS — I421 Obstructive hypertrophic cardiomyopathy: Secondary | ICD-10-CM | POA: Diagnosis not present

## 2019-10-13 MED ORDER — METOPROLOL SUCCINATE ER 25 MG PO TB24: 25.0000 mg | ORAL_TABLET | Freq: Every day | ORAL | 3 refills | Status: DC

## 2019-10-13 NOTE — Patient Instructions (Signed)
  Testing/Procedures:  Your physician has requested that you have an echocardiogram. Echocardiography is a painless test that uses sound waves to create images of your heart. It provides your doctor with information about the size and shape of your heart and how well your heart's chambers and valves are working. This procedure takes approximately one hour. There are no restrictions for this procedure.[place]   Follow-Up: At [facility], you and your health needs are our priority.  As part of our continuing mission to provide you with exceptional heart care, we have created designated Provider Care Teams.  These Care Teams include your primary Cardiologist (physician) and Advanced Practice Providers (APPs -  Physician Assistants and Nurse Practitioners) who all work together to provide you with the care you need, when you need it.  We recommend signing up for the patient portal called "MyChart".  Sign up information is provided on this After Visit Summary.  MyChart is used to connect with patients for Virtual Visits (Telemedicine).  Patients are able to view lab/test results, encounter notes, upcoming appointments, etc.  Non-urgent messages can be sent to your provider as well.   To learn more about what you can do with MyChart, go to [email or web address].    Your next appointment:   12 month(s)  The format for your next appointment:   In Person  Provider:   You may see Kirk Ruths MD or one of the following Advanced Practice Providers on your designated Care Team:    Kerin Ransom, PA-C  [place], [place]  Coletta Memos, [place]

## 2019-10-20 NOTE — Addendum Note (Signed)
Addended by: Hinton Dyer on: 10/20/2019 12:01 PM   Modules accepted: Orders

## 2019-11-02 ENCOUNTER — Ambulatory Visit (HOSPITAL_COMMUNITY): Payer: BC Managed Care – PPO | Attending: Cardiology

## 2019-11-02 ENCOUNTER — Other Ambulatory Visit: Payer: Self-pay

## 2019-11-02 DIAGNOSIS — I421 Obstructive hypertrophic cardiomyopathy: Secondary | ICD-10-CM | POA: Insufficient documentation

## 2019-11-02 LAB — ECHOCARDIOGRAM COMPLETE
AV Mean grad: 26 mmHg
AV Peak grad: 37.5 mmHg
Ao pk vel: 3.06 m/s
Area-P 1/2: 2.5 cm2
S' Lateral: 2.8 cm

## 2019-11-06 ENCOUNTER — Encounter: Payer: Self-pay | Admitting: *Deleted

## 2019-11-08 ENCOUNTER — Telehealth: Payer: Self-pay

## 2019-11-08 NOTE — Telephone Encounter (Signed)
Patient is wanting to know if a biometric screening can be done at [facility] on 02/14/20 and also wants to know for the blood work can he be tested for his allergies    Please call and advise if this can be done

## 2019-11-09 NOTE — Telephone Encounter (Signed)
Not quite sure what a biometric screening is, can he explain? Also, to test for allergies he needs skin patch testing and that is done by an allergist.

## 2019-11-09 NOTE — Telephone Encounter (Signed)
Spoke with patient and if needed he will bring a form with him to his appointment.  Patient has scheduled an appointment with an allergist.

## 2019-11-11 ENCOUNTER — Other Ambulatory Visit: Payer: Self-pay

## 2019-11-11 ENCOUNTER — Ambulatory Visit: Payer: BC Managed Care – PPO | Attending: Internal Medicine

## 2019-11-11 DIAGNOSIS — Z23 Encounter for immunization: Secondary | ICD-10-CM

## 2019-11-11 NOTE — Progress Notes (Signed)
   Covid-19 Vaccination Clinic  Name:  George [name]    MRN: [id number] DOB: 1950-07-04  11/11/2019  George [name] was observed post Covid-19 immunization for 15 minutes without incident. He was provided with Vaccine Information Sheet and instruction to access the V-Safe system.   George [name] was instructed to call 911 with any severe reactions post vaccine: Marland Kitchen Difficulty breathing  . Swelling of face and throat  . A fast heartbeat  . A bad rash all over body  . Dizziness and weakness

## 2019-11-19 ENCOUNTER — Other Ambulatory Visit: Payer: Self-pay | Admitting: Internal Medicine

## 2019-11-19 DIAGNOSIS — J302 Other seasonal allergic rhinitis: Secondary | ICD-10-CM

## 2019-12-01 ENCOUNTER — Encounter: Payer: Self-pay | Admitting: Gastroenterology

## 2019-12-01 ENCOUNTER — Ambulatory Visit: Payer: BC Managed Care – PPO | Admitting: Gastroenterology

## 2019-12-01 VITALS — BP 110/72 | HR 74 | Ht 70.0 in | Wt 198.2 lb

## 2019-12-01 DIAGNOSIS — K648 Other hemorrhoids: Secondary | ICD-10-CM | POA: Diagnosis not present

## 2019-12-01 DIAGNOSIS — K644 Residual hemorrhoidal skin tags: Secondary | ICD-10-CM | POA: Diagnosis not present

## 2019-12-01 NOTE — Progress Notes (Signed)
    History of Present Illness: This is a 69 year old male with occasional rectal itching, minor anal leakage. He underwent colonoscopy in April 2021 that showed moderate internal hemorrhoids and external hemorrhoids.  He follows a high-fiber diet and denies straining or constipation.  He has used Anusol suppositories intermittently.  He is interested in hemorrhoidal banding.  Current Medications, Allergies, Past Medical History, Past Surgical History, Family History and Social History were reviewed in [employer] record.   Physical Exam: General: Well developed, well nourished, no acute distress Head: Normocephalic and atraumatic Eyes:  sclerae anicteric, EOMI Ears: Normal auditory acuity Mouth: Not examined, mask on during Covid-19 pandemic Lungs: Clear throughout to auscultation Heart: Regular rate and rhythm; no murmurs, rubs or bruits Abdomen: Soft, non tender and non distended. No masses, hepatosplenomegaly or hernias noted. Normal Bowel sounds Rectal: Moderate external tags, small external hemorrhoids, no internal lesions, no tenderness, Hemoccult negative brown stool in the vault Musculoskeletal: Symmetrical with no gross deformities  Pulses:  Normal pulses noted Extremities: No clubbing, cyanosis, edema or deformities noted Neurological: Alert oriented x 4, grossly nonfocal Psychological:  Alert and cooperative. Normal mood and affect   Assessment and Recommendations:  1.  Grade 1 moderate sized internal hemorrhoids with occasional itching and minor anal leakage.  We discussed hemorrhoid banding as treatment for internal hemorrhoids.  External hemorrhoids and external tags will not improve with internal hemorrhoid banding.  We discussed the risk benefits and alternatives to banding and he would like to proceed which will be scheduled electively.  Continue to follow a high-fiber diet with adequate daily water intake.  Ample time was allowed for questions and I  addressed his questions regarding hemorrhoid management and hemorrhoid banding to his satisfaction.  2.  Personal history of tubular adenoma and sessile serrated polyps.  A 3-year interval surveillance colonoscopy is recommended in April 2024.

## 2019-12-01 NOTE — Patient Instructions (Addendum)
We have scheduled your first hemorrhoid banding on 12/26/19 at 3:40pm.  Thank you for choosing me and [facility].  Pricilla Riffle. Dagoberto Ligas., MD., Marval Regal

## 2019-12-26 ENCOUNTER — Encounter: Payer: Self-pay | Admitting: Gastroenterology

## 2019-12-26 ENCOUNTER — Ambulatory Visit (INDEPENDENT_AMBULATORY_CARE_PROVIDER_SITE_OTHER): Payer: BC Managed Care – PPO | Admitting: Gastroenterology

## 2019-12-26 VITALS — BP 144/88 | HR 64 | Ht 70.0 in | Wt 200.0 lb

## 2019-12-26 DIAGNOSIS — K640 First degree hemorrhoids: Secondary | ICD-10-CM | POA: Diagnosis not present

## 2019-12-26 DIAGNOSIS — K648 Other hemorrhoids: Secondary | ICD-10-CM | POA: Diagnosis not present

## 2019-12-26 NOTE — Patient Instructions (Signed)
If you are age 67 or older, your body mass index should be between 23-30. Your Body mass index is 28.7 kg/m. If this is out of the aforementioned range listed, please consider follow up with your Primary Care Provider.  If you are age 99 or younger, your body mass index should be between 19-25. Your Body mass index is 28.7 kg/m. If this is out of the aformentioned range listed, please consider follow up with your Primary Care Provider.      HEMORRHOID BANDING PROCEDURE    FOLLOW-UP CARE   1. The procedure you have had should have been relatively painless since the banding of the area involved does not have nerve endings and there is no pain sensation.  The rubber band cuts off the blood supply to the hemorrhoid and the band may fall off as soon as 48 hours after the banding (the band may occasionally be seen in the toilet bowl following a bowel movement). You may notice a temporary feeling of fullness in the rectum which should respond adequately to plain Tylenol or Motrin.  2. Following the banding, avoid strenuous exercise that evening and resume full activity the next day.  A sitz bath (soaking in a warm tub) or bidet is soothing, and can be useful for cleansing the area after bowel movements.     3. To avoid constipation, take two tablespoons of natural wheat bran, natural oat bran, flax, Benefiber or any over the counter fiber supplement and increase your water intake to 7-8 glasses daily.    4. Unless you have been prescribed anorectal medication, do not put anything inside your rectum for two weeks: No suppositories, enemas, fingers, etc.  5. Occasionally, you may have more bleeding than usual after the banding procedure.  This is often from the untreated hemorrhoids rather than the treated one.  Don't be concerned if there is a tablespoon or so of blood.  If there is more blood than this, lie flat with your bottom higher than your head and apply an ice pack to the area. If the  bleeding does not stop within a half an hour or if you feel faint, call our office at [phone] or go to the emergency room.  6. Problems are not common; however, if there is a substantial amount of bleeding, severe pain, chills, fever or difficulty passing urine (very rare) or other problems, you should call us at [phone] or report to the nearest emergency room.  7. Do not stay seated continuously for more than 2-3 hours for a day or two after the procedure.  Tighten your buttock muscles 10-15 times every two hours and take 10-15 deep breaths every 1-2 hours.  Do not spend more than a few minutes on the toilet if you cannot empty your bowel; instead re-visit the toilet at a later time.

## 2019-12-26 NOTE — Progress Notes (Signed)
PROCEDURE NOTE: The patient presents with symptomatic grade I hemorrhoids, requesting rubber band ligation of their hemorrhoidal disease.  All risks, benefits and alternative forms of therapy were described and informed consent was obtained.  The anorectum was pre-medicated during digital exam with 0.125% NTG and 5% lidocaine. DRE showed small external hemorrhoids, skin tags and slightly longer than normal anal canal.  In the Left Lateral Decubitus position anoscopic examination revealed grade I hemorrhoids in the RA > RP > LL positions.   The decision was made to band the RA hemorrhoid initially however 3 bands did not attach. A new ligator was used and the decision was made to band the RP internal hemorrhoid, and the [place] was used to perform band ligation without complication. 1 bands did not attach on the RP hemorrhoid.    Digital anorectal examination was then performed to assure proper positioning of the band and to adjust the banded tissue as required. No adjustment was needed.  No complications were encountered and the patient tolerated the procedure well.  Dietary and behavioral recommendations were given and along with follow-up instructions.   Return for possible additional hemorrhoid banding in 2 - 3 weeks as required.  High fiber diet, Benefiber daily and at least 8 glasses of water daily. The patient was discharged home without pain or other post procedure problems.

## 2020-01-19 ENCOUNTER — Encounter: Payer: BC Managed Care – PPO | Admitting: Gastroenterology

## 2020-02-06 ENCOUNTER — Encounter: Payer: Self-pay | Admitting: Gastroenterology

## 2020-02-06 ENCOUNTER — Ambulatory Visit (INDEPENDENT_AMBULATORY_CARE_PROVIDER_SITE_OTHER): Payer: BC Managed Care – PPO | Admitting: Gastroenterology

## 2020-02-06 VITALS — BP 164/80 | HR 72 | Ht 68.5 in | Wt 199.4 lb

## 2020-02-06 DIAGNOSIS — K648 Other hemorrhoids: Secondary | ICD-10-CM

## 2020-02-06 DIAGNOSIS — K640 First degree hemorrhoids: Secondary | ICD-10-CM

## 2020-02-06 NOTE — Progress Notes (Signed)
PROCEDURE NOTE: The patient presents with symptomatic grade 1 hemorrhoids, requesting rubber band ligation of their hemorrhoidal disease.  All risks, benefits and alternative forms of therapy were described and informed consent was obtained. RP banded on 12/26/2019 (see procedure note) and this is banding #2.   The anorectum was pre-medicated during digital exam with 0.125% NTG and 5% lidocaine. Perianal erythema was noted in the perianal area. Small external hemorrhoidal tags noted.   In the Left Lateral Decubitus position anoscopic examination revealed grade 1 hemorrhoids in the RA > RP > LL positions. RA and RP were both moderate sized and erythematous.  The decision was made to band the RA internal hemorrhoid, and the [place] was used to perform band ligation without complication.  Digital anorectal examination was then performed to assure proper positioning of the band and to adjust the banded tissue as required. No adjustment was needed.  No complications were encountered and the patient tolerated the procedure well.  Moderated sized, erythematous RP may need repeat banding.   Perianal dermatitis. Lotrimin AF cream topically to perianal skin bid to tid for 7 days. Following Lotrimin AF no additional creams externally for the following several weeks.  Dietary and behavioral recommendations were given along with follow-up instructions.   Return for possible additional hemorrhoid banding in 2 - 3 weeks as required.  High fiber diet, Benefiber daily and at least 8 glasses of water daily. Avoid straining with bowel movements and avoid sitting for prolonged periods of time.  The patient was discharged home without pain or other post procedure problems.

## 2020-02-06 NOTE — Patient Instructions (Signed)
You can purchase over the counter Lotrimin AF cream and apply 2-3 x daily x 1 week with no other creams.  HEMORRHOID BANDING PROCEDURE    FOLLOW-UP CARE   1. The procedure you have had should have been relatively painless since the banding of the area involved does not have nerve endings and there is no pain sensation.  The rubber band cuts off the blood supply to the hemorrhoid and the band may fall off as soon as 48 hours after the banding (the band may occasionally be seen in the toilet bowl following a bowel movement). You may notice a temporary feeling of fullness in the rectum which should respond adequately to plain Tylenol or Motrin.  2. Following the banding, avoid strenuous exercise that evening and resume full activity the next day.  A sitz bath (soaking in a warm tub) or bidet is soothing, and can be useful for cleansing the area after bowel movements.     3. To avoid constipation, take two tablespoons of natural wheat bran, natural oat bran, flax, Benefiber or any over the counter fiber supplement and increase your water intake to 7-8 glasses daily.    4. Unless you have been prescribed anorectal medication, do not put anything inside your rectum for two weeks: No suppositories, enemas, fingers, etc.  5. Occasionally, you may have more bleeding than usual after the banding procedure.  This is often from the untreated hemorrhoids rather than the treated one.  Don't be concerned if there is a tablespoon or so of blood.  If there is more blood than this, lie flat with your bottom higher than your head and apply an ice pack to the area. If the bleeding does not stop within a half an hour or if you feel faint, call our office at [phone] or go to the emergency room.  6. Problems are not common; however, if there is a substantial amount of bleeding, severe pain, chills, fever or difficulty passing urine (very rare) or other problems, you should call us at [phone] or report  to the nearest emergency room.  7. Do not stay seated continuously for more than 2-3 hours for a day or two after the procedure.  Tighten your buttock muscles 10-15 times every two hours and take 10-15 deep breaths every 1-2 hours.  Do not spend more than a few minutes on the toilet if you cannot empty your bowel; instead re-visit the toilet at a later time.    Thank you for choosing me and [facility].  Pricilla Riffle. Dagoberto Ligas., MD., [facility]al

## 2020-02-14 ENCOUNTER — Other Ambulatory Visit: Payer: Self-pay

## 2020-02-14 ENCOUNTER — Ambulatory Visit (INDEPENDENT_AMBULATORY_CARE_PROVIDER_SITE_OTHER): Payer: BC Managed Care – PPO | Admitting: Internal Medicine

## 2020-02-14 ENCOUNTER — Encounter: Payer: Self-pay | Admitting: Internal Medicine

## 2020-02-14 VITALS — BP 130/80 | HR 59 | Temp 98.6°F | Ht 69.0 in | Wt 200.3 lb

## 2020-02-14 DIAGNOSIS — I10 Essential (primary) hypertension: Secondary | ICD-10-CM

## 2020-02-14 DIAGNOSIS — I421 Obstructive hypertrophic cardiomyopathy: Secondary | ICD-10-CM | POA: Diagnosis not present

## 2020-02-14 DIAGNOSIS — Z23 Encounter for immunization: Secondary | ICD-10-CM | POA: Diagnosis not present

## 2020-02-14 DIAGNOSIS — Z125 Encounter for screening for malignant neoplasm of prostate: Secondary | ICD-10-CM | POA: Diagnosis not present

## 2020-02-14 DIAGNOSIS — Z0000 Encounter for general adult medical examination without abnormal findings: Secondary | ICD-10-CM

## 2020-02-14 LAB — CBC WITH DIFFERENTIAL/PLATELET
Basophils Absolute: 0.1 10*3/uL (ref 0.0–0.1)
Basophils Relative: 0.8 % (ref 0.0–3.0)
Eosinophils Absolute: 0.2 10*3/uL (ref 0.0–0.7)
Eosinophils Relative: 3.3 % (ref 0.0–5.0)
HCT: 43.3 % (ref 39.0–52.0)
Hemoglobin: 15 g/dL (ref 13.0–17.0)
Lymphocytes Relative: 26.9 % (ref 12.0–46.0)
Lymphs Abs: 1.7 10*3/uL (ref 0.7–4.0)
MCHC: 34.6 g/dL (ref 30.0–36.0)
MCV: 85.3 fl (ref 78.0–100.0)
Monocytes Absolute: 0.5 10*3/uL (ref 0.1–1.0)
Monocytes Relative: 8.3 % (ref 3.0–12.0)
Neutro Abs: 3.8 10*3/uL (ref 1.4–7.7)
Neutrophils Relative %: 60.7 % (ref 43.0–77.0)
Platelets: 185 10*3/uL (ref 150.0–400.0)
RBC: 5.08 Mil/uL (ref 4.22–5.81)
RDW: 13.4 % (ref 11.5–15.5)
WBC: 6.3 10*3/uL (ref 4.0–10.5)

## 2020-02-14 LAB — COMPREHENSIVE METABOLIC PANEL
ALT: 14 U/L (ref 0–53)
AST: 17 U/L (ref 0–37)
Albumin: 4.8 g/dL (ref 3.5–5.2)
Alkaline Phosphatase: 44 U/L (ref 39–117)
BUN: 20 mg/dL (ref 6–23)
CO2: 33 mEq/L — ABNORMAL HIGH (ref 19–32)
Calcium: 10.1 mg/dL (ref 8.4–10.5)
Chloride: 101 mEq/L (ref 96–112)
Creatinine, Ser: 1.04 mg/dL (ref 0.40–1.50)
GFR: 74 mL/min (ref >60.00)
Glucose, Bld: 99 mg/dL (ref 70–99)
Potassium: 4.4 mEq/L (ref 3.5–5.1)
Sodium: 138 mEq/L (ref 135–145)
Total Bilirubin: 0.6 mg/dL (ref 0.2–1.2)
Total Protein: 7.1 g/dL (ref 6.0–8.3)

## 2020-02-14 LAB — PSA: PSA: 1.86 ng/mL (ref 0.10–4.00)

## 2020-02-14 LAB — LIPID PANEL
Cholesterol: 191 mg/dL (ref 0–200)
HDL: 56 mg/dL (ref >39.00)
LDL Cholesterol: 121 mg/dL — ABNORMAL HIGH (ref 0–99)
NonHDL: 134.81
Total CHOL/HDL Ratio: 3
Triglycerides: 69 mg/dL (ref 0.0–149.0)
VLDL: 13.8 mg/dL (ref 0.0–40.0)

## 2020-02-14 LAB — TSH: TSH: 1.09 u[IU]/mL (ref 0.35–4.50)

## 2020-02-14 LAB — VITAMIN B12: Vitamin B-12: 521 pg/mL (ref 211–911)

## 2020-02-14 LAB — VITAMIN D 25 HYDROXY (VIT D DEFICIENCY, FRACTURES): VITD: 50.86 ng/mL (ref 30.00–100.00)

## 2020-02-14 NOTE — Addendum Note (Signed)
Addended by: Janann Colonel on: 02/14/2020 10:56 AM   Modules accepted: Orders

## 2020-02-14 NOTE — Progress Notes (Signed)
Established Patient Office Visit     This visit occurred during the SARS-CoV-2 public health emergency.  Safety protocols were in place, including screening questions prior to the visit, additional usage of staff PPE, and extensive cleaning of exam room while observing appropriate contact time as indicated for disinfecting solutions.    CC/Reason for Visit: Annual preventive exam  HPI: George [name] is a 70 y.o. male who is coming in today for the above mentioned reasons. Past Medical History is significant for: Hypertrophic cardiomyopathy followed by Dr. Stanford Breed on a yearly basis, hypertension that has been well controlled as well as seasonal allergies.  He really has no acute complaints today.  Since I last saw him he was fitted with a left hearing aid.  He is still working full-time.  He suffered a fall around Christmas time on his back deck when he slipped on black ice, he fell on his left upper arm, it is still a little tender but is improving, he has no limitation to range of motion.  He is fully vaccinated against Covid, influenza.  He is due for Tdap vaccines today.  He had a colonoscopy in 2021.  He has developed internal hemorrhoids and has had a few banding procedures with GI.   Past Medical/Surgical History: Past Medical History:  Diagnosis Date  . Allergy   . Colon polyp    Mucosal prolapse polyp  . Diverticulosis   . Essential hypertension 09/22/2008   Qualifier: Diagnosis of  By: Burnett Kanaris    . External hemorrhoids   . HOCM (hypertrophic obstructive cardiomyopathy) ([place])   . Hypertension   . Hypertrophic obstructive cardiomyopathy ([place]) 09/24/2008   Qualifier: Diagnosis of  By: Stanford Breed, MD, Kandyce Rud   . Irregular heart beat   . VASOMOTOR RHINITIS 12/24/2008   Qualifier: Diagnosis of  By: Burnice Logan  MD, [place], LEFT 09/22/2008   Qualifier: Diagnosis of  By: Burnett Kanaris      Past Surgical History:   Procedure Laterality Date  . COLONOSCOPY    . TONSILLECTOMY      Social History:  reports that he quit smoking about 35 years ago. His smoking use included cigarettes. He has never used smokeless tobacco. He reports previous alcohol use. He reports that he does not use drugs.  Allergies: Allergies  Allergen Reactions  . Sulfonamide Derivatives     Family History:  Family History  Problem Relation Age of Onset  . Pancreatic cancer Father   . Colon polyps Father        Benign  . Colon cancer Neg Hx   . Diabetes Neg Hx   . Kidney disease Neg Hx   . Gallbladder disease Neg Hx   . Esophageal cancer Neg Hx   . Rectal cancer Neg Hx   . Stomach cancer Neg Hx      Current Outpatient Medications:  .  azelastine (ASTELIN) 0.1 % nasal spray, Place 1 spray into both nostrils 2 (two) times daily. Use in each nostril as directed, Disp: , Rfl:  .  fluticasone (FLONASE) 50 MCG/ACT nasal spray, Place 1 spray into both nostrils 2 (two) times daily., Disp: , Rfl:  .  hydrocortisone (ANUSOL-HC) 25 MG suppository, Place 1 suppository (25 mg total) rectally 2 (two) times daily as needed for hemorrhoids or anal itching., Disp: 30 suppository, Rfl: 5 .  levocetirizine (XYZAL) 5 MG tablet, 1 tablet in the evening, Disp: , Rfl:  .  metoprolol  succinate (TOPROL-XL) 25 MG 24 hr tablet, Take 1 tablet (25 mg total) by mouth daily., Disp: 90 tablet, Rfl: 3 .  montelukast (SINGULAIR) 10 MG tablet, Take 10 mg by mouth daily., Disp: , Rfl:  .  Multiple Vitamin (MULTIVITAMIN) tablet, Take 1 tablet by mouth daily., Disp: , Rfl:  .  RESVERATROL PO, Take 325 each by mouth., Disp: , Rfl:   Review of Systems:  Constitutional: Denies fever, chills, diaphoresis, appetite change and fatigue.  HEENT: Denies photophobia, eye pain, redness, hearing loss, ear pain, congestion, sore throat, rhinorrhea, sneezing, mouth sores, trouble swallowing, neck pain, neck stiffness and tinnitus.   Respiratory: Denies SOB, DOE,  cough, chest tightness,  and wheezing.   Cardiovascular: Denies chest pain, palpitations and leg swelling.  Gastrointestinal: Denies nausea, vomiting, abdominal pain, diarrhea, constipation, blood in stool and abdominal distention.  Genitourinary: Denies dysuria, urgency, frequency, hematuria, flank pain and difficulty urinating.  Endocrine: Denies: hot or cold intolerance, sweats, changes in hair or nails, polyuria, polydipsia. Musculoskeletal: Denies myalgias, back pain, joint swelling, arthralgias and gait problem.  Skin: Denies pallor, rash and wound.  Neurological: Denies dizziness, seizures, syncope, weakness, light-headedness, numbness and headaches.  Hematological: Denies adenopathy. Easy bruising, personal or family bleeding history  Psychiatric/Behavioral: Denies suicidal ideation, mood changes, confusion, nervousness, sleep disturbance and agitation    Physical Exam: Vitals:   02/14/20 0936  BP: 130/80  Pulse: (!) 59  Temp: 98.6 F (37 C)  TempSrc: Oral  SpO2: 99%  Weight: 200 lb 4.8 oz (90.9 kg)  Height: 5\' 9"  (1.753 m)    Body mass index is 29.58 kg/m.   Constitutional: NAD, calm, comfortable Eyes: PERRL, lids and conjunctivae normal ENMT: Mucous membranes are moist. Posterior pharynx clear of any exudate or lesions. Normal dentition. Tympanic membrane is pearly white, no erythema or bulging. Neck: normal, supple, no masses, no thyromegaly Respiratory: clear to auscultation bilaterally, no wheezing, no crackles. Normal respiratory effort. No accessory muscle use.  Cardiovascular: Regular rate and rhythm, no murmurs / rubs / gallops. No extremity edema. 2+ pedal pulses.  Abdomen: no tenderness, no masses palpated. No hepatosplenomegaly. Bowel sounds positive.  Musculoskeletal: no clubbing / cyanosis. No joint deformity upper and lower extremities. Good ROM, no contractures. Normal muscle tone.  Skin: no rashes, lesions, ulcers. No induration Neurologic: CN 2-12  grossly intact. Sensation intact, DTR normal. Strength 5/5 in all 4.  Psychiatric: Normal judgment and insight. Alert and oriented x 3. Normal mood.    Impression and Plan:  Encounter for preventive health examination  -He has routine eye and dental care. -Tdap today, other immunizations are up-to-date and age-appropriate. -Screening labs today. -Healthy lifestyle discussed in detail. -He had a colonoscopy in 2021 and is a 5-year callback.  Hypertrophic obstructive cardiomyopathy ([place])  -Followed by cardiology, on metoprolol.  Essential hypertension  -Well-controlled on metoprolol 25 mg daily.  Need for Tdap vaccination -Tdap administered today.   Patient Instructions   -Nice seeing you today!!  -Lab work today; will notify you once results are available.  -tetanus vaccine today.  -Schedule follow up in 1 year or sooner as needed.   Preventive Care 52 Years and Older, Male Preventive care refers to lifestyle choices and visits with your health care provider that can promote health and wellness. This includes:  A yearly physical exam. This is also called an annual wellness visit.  Regular dental and eye exams.  Immunizations.  Screening for certain conditions.  Healthy lifestyle choices, such as: ? Eating a healthy  diet. ? Getting regular exercise. ? Not using drugs or products that contain nicotine and tobacco. ? Limiting alcohol use. What can I expect for my preventive care visit? Physical exam Your health care provider will check your:  Height and weight. These may be used to calculate your BMI (body mass index). BMI is a measurement that tells if you are at a healthy weight.  Heart rate and blood pressure.  Body temperature.  Skin for abnormal spots. Counseling Your health care provider may ask you questions about your:  Past medical problems.  Family's medical history.  Alcohol, tobacco, and drug use.  Emotional well-being.  Home life and  relationship well-being.  Sexual activity.  Diet, exercise, and sleep habits.  History of falls.  Memory and ability to understand (cognition).  Work and work Statistician.  Access to firearms. What immunizations do I need? Vaccines are usually given at various ages, according to a schedule. Your health care provider will recommend vaccines for you based on your age, medical history, and lifestyle or other factors, such as travel or where you work.   What tests do I need? Blood tests  Lipid and cholesterol levels. These may be checked every 5 years, or more often depending on your overall health.  Hepatitis C test.  Hepatitis B test. Screening  Lung cancer screening. You may have this screening every year starting at age 62 if you have a 30-pack-year history of smoking and currently smoke or have quit within the past 15 years.  Colorectal cancer screening. ? All adults should have this screening starting at age 101 and continuing until age 60. ? Your health care provider may recommend screening at age 37 if you are at increased risk. ? You will have tests every 1-10 years, depending on your results and the type of screening test.  Prostate cancer screening. Recommendations will vary depending on your family history and other risks.  Genital exam to check for testicular cancer or hernias.  Diabetes screening. ? This is done by checking your blood sugar (glucose) after you have not eaten for a while (fasting). ? You may have this done every 1-3 years.  Abdominal aortic aneurysm (AAA) screening. You may need this if you are a current or former smoker.  STD (sexually transmitted disease) testing, if you are at risk. Follow these instructions at home: Eating and drinking  Eat a diet that includes fresh fruits and vegetables, whole grains, lean protein, and low-fat dairy products. Limit your intake of foods with high amounts of sugar, saturated fats, and salt.  Take vitamin  and mineral supplements as recommended by your health care provider.  Do not drink alcohol if your health care provider tells you not to drink.  If you drink alcohol: ? Limit how much you have to 0-2 drinks a day. ? Be aware of how much alcohol is in your drink. In the [place] one drink equals one 12 oz bottle of beer (355 mL), one 5 oz glass of wine (148 mL), or one 1 oz glass of hard liquor (44 mL).   Lifestyle  Take daily care of your teeth and gums. Brush your teeth every morning and night with fluoride toothpaste. Floss one time each day.  Stay active. Exercise for at least 30 minutes 5 or more days each week.  Do not use any products that contain nicotine or tobacco, such as cigarettes, e-cigarettes, and chewing tobacco. If you need help quitting, ask your health care provider.  Do not  use drugs.  If you are sexually active, practice safe sex. Use a condom or other form of protection to prevent STIs (sexually transmitted infections).  Talk with your health care provider about taking a low-dose aspirin or statin.  Find healthy ways to cope with stress, such as: ? Meditation, yoga, or listening to music. ? Journaling. ? Talking to a trusted person. ? Spending time with friends and family. Safety  Always wear your seat belt while driving or riding in a vehicle.  Do not drive: ? If you have been drinking alcohol. Do not ride with someone who has been drinking. ? When you are tired or distracted. ? While texting.  Wear a helmet and other protective equipment during sports activities.  If you have firearms in your house, make sure you follow all gun safety procedures. What's next?  Visit your health care provider once a year for an annual wellness visit.  Ask your health care provider how often you should have your eyes and teeth checked.  Stay up to date on all vaccines. This information is not intended to replace advice given to you by your health care provider. Make  sure you discuss any questions you have with your health care provider. Document Revised: 09/27/2018 Document Reviewed: 12/23/2017 Elsevier Patient Education  2021 [place], MD [facility]

## 2020-02-14 NOTE — Patient Instructions (Signed)
-Nice seeing you today!!  -Lab work today; will notify you once results are available.  -tetanus vaccine today.  -Schedule follow up in 1 year or sooner as needed.   Preventive Care 19 Years and Older, Male Preventive care refers to lifestyle choices and visits with your health care provider that can promote health and wellness. This includes:  A yearly physical exam. This is also called an annual wellness visit.  Regular dental and eye exams.  Immunizations.  Screening for certain conditions.  Healthy lifestyle choices, such as: ? Eating a healthy diet. ? Getting regular exercise. ? Not using drugs or products that contain nicotine and tobacco. ? Limiting alcohol use. What can I expect for my preventive care visit? Physical exam Your health care provider will check your:  Height and weight. These may be used to calculate your BMI (body mass index). BMI is a measurement that tells if you are at a healthy weight.  Heart rate and blood pressure.  Body temperature.  Skin for abnormal spots. Counseling Your health care provider may ask you questions about your:  Past medical problems.  Family's medical history.  Alcohol, tobacco, and drug use.  Emotional well-being.  Home life and relationship well-being.  Sexual activity.  Diet, exercise, and sleep habits.  History of falls.  Memory and ability to understand (cognition).  Work and work Statistician.  Access to firearms. What immunizations do I need? Vaccines are usually given at various ages, according to a schedule. Your health care provider will recommend vaccines for you based on your age, medical history, and lifestyle or other factors, such as travel or where you work.   What tests do I need? Blood tests  Lipid and cholesterol levels. These may be checked every 5 years, or more often depending on your overall health.  Hepatitis C test.  Hepatitis B test. Screening  Lung cancer screening. You  may have this screening every year starting at age 68 if you have a 30-pack-year history of smoking and currently smoke or have quit within the past 15 years.  Colorectal cancer screening. ? All adults should have this screening starting at age 68 and continuing until age 105. ? Your health care provider may recommend screening at age 54 if you are at increased risk. ? You will have tests every 1-10 years, depending on your results and the type of screening test.  Prostate cancer screening. Recommendations will vary depending on your family history and other risks.  Genital exam to check for testicular cancer or hernias.  Diabetes screening. ? This is done by checking your blood sugar (glucose) after you have not eaten for a while (fasting). ? You may have this done every 1-3 years.  Abdominal aortic aneurysm (AAA) screening. You may need this if you are a current or former smoker.  STD (sexually transmitted disease) testing, if you are at risk. Follow these instructions at home: Eating and drinking  Eat a diet that includes fresh fruits and vegetables, whole grains, lean protein, and low-fat dairy products. Limit your intake of foods with high amounts of sugar, saturated fats, and salt.  Take vitamin and mineral supplements as recommended by your health care provider.  Do not drink alcohol if your health care provider tells you not to drink.  If you drink alcohol: ? Limit how much you have to 0-2 drinks a day. ? Be aware of how much alcohol is in your drink. In the [place] one drink equals one 12 oz bottle  of beer (355 mL), one 5 oz glass of wine (148 mL), or one 1 oz glass of hard liquor (44 mL).   Lifestyle  Take daily care of your teeth and gums. Brush your teeth every morning and night with fluoride toothpaste. Floss one time each day.  Stay active. Exercise for at least 30 minutes 5 or more days each week.  Do not use any products that contain nicotine or tobacco, such as  cigarettes, e-cigarettes, and chewing tobacco. If you need help quitting, ask your health care provider.  Do not use drugs.  If you are sexually active, practice safe sex. Use a condom or other form of protection to prevent STIs (sexually transmitted infections).  Talk with your health care provider about taking a low-dose aspirin or statin.  Find healthy ways to cope with stress, such as: ? Meditation, yoga, or listening to music. ? Journaling. ? Talking to a trusted person. ? Spending time with friends and family. Safety  Always wear your seat belt while driving or riding in a vehicle.  Do not drive: ? If you have been drinking alcohol. Do not ride with someone who has been drinking. ? When you are tired or distracted. ? While texting.  Wear a helmet and other protective equipment during sports activities.  If you have firearms in your house, make sure you follow all gun safety procedures. What's next?  Visit your health care provider once a year for an annual wellness visit.  Ask your health care provider how often you should have your eyes and teeth checked.  Stay up to date on all vaccines. This information is not intended to replace advice given to you by your health care provider. Make sure you discuss any questions you have with your health care provider. Document Revised: 09/27/2018 Document Reviewed: 12/23/2017 Elsevier Patient Education  2021 [employer].

## 2020-02-15 ENCOUNTER — Encounter: Payer: Self-pay | Admitting: Internal Medicine

## 2020-02-15 ENCOUNTER — Other Ambulatory Visit: Payer: Self-pay | Admitting: Internal Medicine

## 2020-02-15 DIAGNOSIS — E785 Hyperlipidemia, unspecified: Secondary | ICD-10-CM | POA: Insufficient documentation

## 2020-02-20 ENCOUNTER — Ambulatory Visit (INDEPENDENT_AMBULATORY_CARE_PROVIDER_SITE_OTHER): Payer: BC Managed Care – PPO | Admitting: Gastroenterology

## 2020-02-20 ENCOUNTER — Encounter: Payer: Self-pay | Admitting: Gastroenterology

## 2020-02-20 VITALS — BP 142/80 | HR 69 | Wt 201.8 lb

## 2020-02-20 DIAGNOSIS — K640 First degree hemorrhoids: Secondary | ICD-10-CM

## 2020-02-20 NOTE — Patient Instructions (Signed)
Call our office if your symptoms have not improved.   HEMORRHOID BANDING PROCEDURE    FOLLOW-UP CARE   1. The procedure you have had should have been relatively painless since the banding of the area involved does not have nerve endings and there is no pain sensation.  The rubber band cuts off the blood supply to the hemorrhoid and the band may fall off as soon as 48 hours after the banding (the band may occasionally be seen in the toilet bowl following a bowel movement). You may notice a temporary feeling of fullness in the rectum which should respond adequately to plain Tylenol or Motrin.  2. Following the banding, avoid strenuous exercise that evening and resume full activity the next day.  A sitz bath (soaking in a warm tub) or bidet is soothing, and can be useful for cleansing the area after bowel movements.     3. To avoid constipation, take two tablespoons of natural wheat bran, natural oat bran, flax, Benefiber or any over the counter fiber supplement and increase your water intake to 7-8 glasses daily.    4. Unless you have been prescribed anorectal medication, do not put anything inside your rectum for two weeks: No suppositories, enemas, fingers, etc.  5. Occasionally, you may have more bleeding than usual after the banding procedure.  This is often from the untreated hemorrhoids rather than the treated one.  Don't be concerned if there is a tablespoon or so of blood.  If there is more blood than this, lie flat with your bottom higher than your head and apply an ice pack to the area. If the bleeding does not stop within a half an hour or if you feel faint, call our office at [phone] or go to the emergency room.  6. Problems are not common; however, if there is a substantial amount of bleeding, severe pain, chills, fever or difficulty passing urine (very rare) or other problems, you should call us at [phone] or report to the nearest emergency room.  7. Do not stay  seated continuously for more than 2-3 hours for a day or two after the procedure.  Tighten your buttock muscles 10-15 times every two hours and take 10-15 deep breaths every 1-2 hours.  Do not spend more than a few minutes on the toilet if you cannot empty your bowel; instead re-visit the toilet at a later time.

## 2020-02-20 NOTE — Progress Notes (Signed)
PROCEDURE NOTE: The patient presents with symptomatic grade 1 hemorrhoids, requesting rubber band ligation of their hemorrhoidal disease.  All risks, benefits and alternative forms of therapy were described and informed consent was obtained.  RP banding #1 in December 2021 and RA banding #2 in January 2022. Anoscopy in January 2022 showed a moderated sized, erythematous RP which may need repeat banding.  The anorectum was pre-medicated during digital exam with 0.125% NTG and 5% lidocaine. Perianal dermatitis noted which has substantially improved but has not resolved.   The decision was made to band the LL internal hemorrhoid, and the [place] was used to perform band ligation without complication.  Digital anorectal examination was then performed to assure proper positioning of the band and to adjust the banded tissue as required. No adjustment was needed.  No complications were encountered and the patient tolerated the procedure well.  Lotrimin AF cream bid to perianal area for 7 days. If dermatitis persists / returns Dermatology evaluation might be necessary.  Dietary and behavioral recommendations were given and along with follow-up instructions.   High fiber diet, Benefiber daily and at least 8 glasses of water daily. The patient was discharged home without pain or other post procedure problems.  GI follow up prn.

## 2020-08-11 ENCOUNTER — Other Ambulatory Visit: Payer: Self-pay | Admitting: Internal Medicine

## 2020-08-11 DIAGNOSIS — J302 Other seasonal allergic rhinitis: Secondary | ICD-10-CM

## 2020-08-19 ENCOUNTER — Other Ambulatory Visit: Payer: BC Managed Care – PPO

## 2020-09-04 ENCOUNTER — Other Ambulatory Visit: Payer: Self-pay

## 2020-09-04 ENCOUNTER — Other Ambulatory Visit (INDEPENDENT_AMBULATORY_CARE_PROVIDER_SITE_OTHER): Payer: BC Managed Care – PPO

## 2020-09-04 DIAGNOSIS — E785 Hyperlipidemia, unspecified: Secondary | ICD-10-CM

## 2020-09-04 LAB — LIPID PANEL
Cholesterol: 165 mg/dL (ref 0–200)
HDL: 44.2 mg/dL (ref >39.00)
LDL Cholesterol: 107 mg/dL — ABNORMAL HIGH (ref 0–99)
NonHDL: 120.78
Total CHOL/HDL Ratio: 4
Triglycerides: 70 mg/dL (ref 0.0–149.0)
VLDL: 14 mg/dL (ref 0.0–40.0)

## 2020-09-06 ENCOUNTER — Ambulatory Visit: Payer: BC Managed Care – PPO | Admitting: Internal Medicine

## 2020-09-06 ENCOUNTER — Other Ambulatory Visit: Payer: Self-pay

## 2020-09-06 ENCOUNTER — Encounter: Payer: Self-pay | Admitting: Internal Medicine

## 2020-09-06 VITALS — BP 130/80 | HR 62 | Temp 98.3°F | Wt 198.6 lb

## 2020-09-06 DIAGNOSIS — M2041 Other hammer toe(s) (acquired), right foot: Secondary | ICD-10-CM

## 2020-09-06 NOTE — Progress Notes (Signed)
Acute office Visit     This visit occurred during the SARS-CoV-2 public health emergency.  Safety protocols were in place, including screening questions prior to the visit, additional usage of staff PPE, and extensive cleaning of exam room while observing appropriate contact time as indicated for disinfecting solutions.    CC/Reason for Visit: Pain of second right toe  HPI: George [name] is a 70 y.o. male who is coming in today for the above mentioned reasons.  About a year ago he stubbed his second right toe on the bedpost.  It happened again a couple weeks ago.  Since then he has noted some pain and deformity of his second right toe.  Past Medical/Surgical History: Past Medical History:  Diagnosis Date   Allergy    Colon polyp    Mucosal prolapse polyp   Diverticulosis    Essential hypertension 09/22/2008   Qualifier: Diagnosis of  By: Burnett Kanaris     External hemorrhoids    HOCM (hypertrophic obstructive cardiomyopathy) ([place])    Hypertension    Hypertrophic obstructive cardiomyopathy ([place]) 09/24/2008   Qualifier: Diagnosis of  By: Stanford Breed, MD, Kandyce Rud    Irregular heart beat    VASOMOTOR RHINITIS 12/24/2008   Qualifier: Diagnosis of  By: Burnice Logan  MD, Doretha Sou    VENTRICULAR HYPERTROPHY, LEFT 09/22/2008   Qualifier: Diagnosis of  By: Burnett Kanaris      Past Surgical History:  Procedure Laterality Date   COLONOSCOPY     TONSILLECTOMY      Social History:  reports that he quit smoking about 36 years ago. His smoking use included cigarettes. He has never used smokeless tobacco. He reports that he does not currently use alcohol. He reports that he does not use drugs.  Allergies: Allergies  Allergen Reactions   Sulfonamide Derivatives     Family History:  Family History  Problem Relation Age of Onset   Pancreatic cancer Father    Colon polyps Father        Benign   Colon cancer Neg Hx    Diabetes Neg Hx    Kidney disease Neg Hx     Gallbladder disease Neg Hx    Esophageal cancer Neg Hx    Rectal cancer Neg Hx    Stomach cancer Neg Hx      Current Outpatient Medications:    azelastine (ASTELIN) 0.1 % nasal spray, Place 1 spray into both nostrils 2 (two) times daily. Use in each nostril as directed, Disp: , Rfl:    fluticasone (FLONASE) 50 MCG/ACT nasal spray, SPRAY 2 SPRAYS INTO EACH NOSTRIL EVERY DAY, Disp: 48 mL, Rfl: 2   hydrocortisone (ANUSOL-HC) 25 MG suppository, Place 1 suppository (25 mg total) rectally 2 (two) times daily as needed for hemorrhoids or anal itching., Disp: 30 suppository, Rfl: 5   levocetirizine (XYZAL) 5 MG tablet, 1 tablet in the evening, Disp: , Rfl:    metoprolol succinate (TOPROL-XL) 25 MG 24 hr tablet, Take 1 tablet (25 mg total) by mouth daily., Disp: 90 tablet, Rfl: 3   montelukast (SINGULAIR) 10 MG tablet, Take 10 mg by mouth daily., Disp: , Rfl:    Multiple Vitamin (MULTIVITAMIN) tablet, Take 1 tablet by mouth daily., Disp: , Rfl:    RESVERATROL PO, Take 325 each by mouth., Disp: , Rfl:   Review of Systems:  Constitutional: Denies fever, chills, diaphoresis, appetite change and fatigue.  HEENT: Denies photophobia, eye pain, redness, hearing loss, ear pain, congestion, sore throat,  rhinorrhea, sneezing, mouth sores, trouble swallowing, neck pain, neck stiffness and tinnitus.   Respiratory: Denies SOB, DOE, cough, chest tightness,  and wheezing.   Cardiovascular: Denies chest pain, palpitations and leg swelling.  Gastrointestinal: Denies nausea, vomiting, abdominal pain, diarrhea, constipation, blood in stool and abdominal distention.  Genitourinary: Denies dysuria, urgency, frequency, hematuria, flank pain and difficulty urinating.  Endocrine: Denies: hot or cold intolerance, sweats, changes in hair or nails, polyuria, polydipsia. Musculoskeletal: Denies myalgias, back pain, joint swelling, arthralgias and gait problem.  Skin: Denies pallor, rash and wound.  Neurological: Denies  dizziness, seizures, syncope, weakness, light-headedness, numbness and headaches.  Hematological: Denies adenopathy. Easy bruising, personal or family bleeding history  Psychiatric/Behavioral: Denies suicidal ideation, mood changes, confusion, nervousness, sleep disturbance and agitation    Physical Exam: Vitals:   09/06/20 1038  BP: 130/80  Pulse: 62  Temp: 98.3 F (36.8 C)  TempSrc: Oral  SpO2: 97%  Weight: 198 lb 9.6 oz (90.1 kg)    Body mass index is 29.33 kg/m.   Constitutional: NAD, calm, comfortable Eyes: PERRL, lids and conjunctivae normal ENMT: Mucous membranes are moist.  Neurologic: Grossly intact and nonfocal Psychiatric: Normal judgment and insight. Alert and oriented x 3. Normal mood.    Impression and Plan:  Hammertoe of second toe of right foot -He clearly has hammertoe deformity of his second and third right toes, unclear if these are the result of a possible fracture when he had his toe injuries or not. -I recommended referral to podiatry but he prefers to hold off on that for now.    Lelon Frohlich, MD [facility] at [facility]

## 2020-09-18 ENCOUNTER — Other Ambulatory Visit: Payer: BC Managed Care – PPO

## 2020-09-19 ENCOUNTER — Telehealth (INDEPENDENT_AMBULATORY_CARE_PROVIDER_SITE_OTHER): Payer: BC Managed Care – PPO | Admitting: Internal Medicine

## 2020-09-19 DIAGNOSIS — M2041 Other hammer toe(s) (acquired), right foot: Secondary | ICD-10-CM

## 2020-09-19 DIAGNOSIS — U071 COVID-19: Secondary | ICD-10-CM

## 2020-09-19 MED ORDER — MOLNUPIRAVIR EUA 200MG CAPSULE: 4.0000 | ORAL_CAPSULE | Freq: Two times a day (BID) | ORAL | 0 refills | Status: AC

## 2020-09-19 NOTE — Progress Notes (Signed)
Virtual Visit via Video Note  I connected with Mindi Junker on 09/19/20 at  2:00 PM EDT by a video enabled telemedicine application and verified that I am speaking with the correct person using two identifiers.  Location patient: home Location provider: work office Persons participating in the virtual visit: patient, provider  I discussed the limitations of evaluation and management by telemedicine and the availability of in person appointments. The patient expressed understanding and agreed to proceed.   HPI: He has scheduled this visit to inform me that he tested positive for COVID.  He was in [place] last week traveling with a large group.  When he returned he got an email that somebody from the group had tested positive for COVID.  Tuesday afternoon he started having fatigue.  That afternoon he tested positive for COVID.  Wednesday morning symptoms progressed, now with sinus congestion, myalgias, chills and worsening fatigue.  I had seen him in August for a hammertoe and offered podiatry referral which she declined at that time, he is now wishing for me to place referral.   ROS: Constitutional: Denies  chills, appetite change and fatigue.  HEENT: Denies photophobia, eye pain, redness, hearing loss, e mouth sores, trouble swallowing, neck pain, neck stiffness and tinnitus.   Respiratory: Denies SOB, DOE,  chest tightness,  and wheezing.   Cardiovascular: Denies chest pain, palpitations and leg swelling.  Gastrointestinal: Denies nausea, vomiting, abdominal pain, diarrhea, constipation, blood in stool and abdominal distention.  Genitourinary: Denies dysuria, urgency, frequency, hematuria, flank pain and difficulty urinating.  Endocrine: Denies: hot or cold intolerance, sweats, changes in hair or nails, polyuria, polydipsia. Musculoskeletal: Denies myalgias, back pain, joint swelling, arthralgias and gait problem.  Skin: Denies pallor, rash and wound.  Neurological: Denies  dizziness, seizures, syncope, weakness, light-headedness, numbness and headaches.  Hematological: Denies adenopathy. Easy bruising, personal or family bleeding history  Psychiatric/Behavioral: Denies suicidal ideation, mood changes, confusion, nervousness, sleep disturbance and agitation   Past Medical History:  Diagnosis Date   Allergy    Colon polyp    Mucosal prolapse polyp   Diverticulosis    Essential hypertension 09/22/2008   Qualifier: Diagnosis of  By: Burnett Kanaris     External hemorrhoids    HOCM (hypertrophic obstructive cardiomyopathy) ([place])    Hypertension    Hypertrophic obstructive cardiomyopathy ([place]) 09/24/2008   Qualifier: Diagnosis of  By: Stanford Breed, MD, Kandyce Rud    Irregular heart beat    VASOMOTOR RHINITIS 12/24/2008   Qualifier: Diagnosis of  By: Burnice Logan  MD, Doretha Sou    VENTRICULAR HYPERTROPHY, LEFT 09/22/2008   Qualifier: Diagnosis of  By: Burnett Kanaris      Past Surgical History:  Procedure Laterality Date   COLONOSCOPY     TONSILLECTOMY      Family History  Problem Relation Age of Onset   Pancreatic cancer Father    Colon polyps Father        Benign   Colon cancer Neg Hx    Diabetes Neg Hx    Kidney disease Neg Hx    Gallbladder disease Neg Hx    Esophageal cancer Neg Hx    Rectal cancer Neg Hx    Stomach cancer Neg Hx     SOCIAL HX:   reports that he quit smoking about 36 years ago. His smoking use included cigarettes. He has never used smokeless tobacco. He reports that he does not currently use alcohol. He reports that he does not use drugs.  Current Outpatient Medications:    molnupiravir EUA 200 mg CAPS, Take 4 capsules (800 mg total) by mouth 2 (two) times daily for 5 days., Disp: 40 capsule, Rfl: 0   azelastine (ASTELIN) 0.1 % nasal spray, Place 1 spray into both nostrils 2 (two) times daily. Use in each nostril as directed, Disp: , Rfl:    fluticasone (FLONASE) 50 MCG/ACT nasal spray, SPRAY 2 SPRAYS INTO EACH  NOSTRIL EVERY DAY, Disp: 48 mL, Rfl: 2   hydrocortisone (ANUSOL-HC) 25 MG suppository, Place 1 suppository (25 mg total) rectally 2 (two) times daily as needed for hemorrhoids or anal itching., Disp: 30 suppository, Rfl: 5   levocetirizine (XYZAL) 5 MG tablet, 1 tablet in the evening, Disp: , Rfl:    metoprolol succinate (TOPROL-XL) 25 MG 24 hr tablet, Take 1 tablet (25 mg total) by mouth daily., Disp: 90 tablet, Rfl: 3   montelukast (SINGULAIR) 10 MG tablet, Take 10 mg by mouth daily., Disp: , Rfl:    Multiple Vitamin (MULTIVITAMIN) tablet, Take 1 tablet by mouth daily., Disp: , Rfl:    RESVERATROL PO, Take 325 each by mouth., Disp: , Rfl:   EXAM:   VITALS per patient if applicable: None reported  GENERAL: alert, oriented, appears well and in no acute distress, sounds congested  HEENT: atraumatic, conjunttiva clear, no obvious abnormalities on inspection of external nose and ears  NECK: normal movements of the head and neck  LUNGS: on inspection no signs of respiratory distress, breathing rate appears normal, no obvious gross increased work of breathing, gasping or wheezing  CV: no obvious cyanosis  MS: moves all visible extremities without noticeable abnormality  PSYCH/NEURO: pleasant and cooperative, no obvious depression or anxiety, speech and thought processing grossly intact  ASSESSMENT AND PLAN:   COVID-19  - Plan: molnupiravir EUA 200 mg CAPS -He may also use OTC medications such as antihistamines, decongestants, pain relievers, guaifenesin. -We have reviewed quarantine period of 5 days. -We have discussed symptoms that would promote ED evaluation. -He knows to follow with [place] if symptoms fail to resolve.  Hammertoe of second toe of right foot  - Plan: Ambulatory referral to Podiatry     I discussed the assessment and treatment plan with the patient. The patient was provided an opportunity to ask questions and all were answered. The patient agreed with the plan and  demonstrated an understanding of the instructions.   The patient was advised to call back or seek an in-person evaluation if the symptoms worsen or if the condition fails to improve as anticipated.    Lelon Frohlich, MD  [facility] at [facility]

## 2020-09-26 ENCOUNTER — Telehealth: Payer: Self-pay | Admitting: Internal Medicine

## 2020-09-26 NOTE — Telephone Encounter (Signed)
PT called to advise that the med he got for covid has been working but he needs a refill of the molnupiravir EUA 200 mg CAPS. Please send it to the Rx on file and notify the PT when its been sent as requested by PT.

## 2020-09-27 ENCOUNTER — Ambulatory Visit: Payer: BC Managed Care – PPO | Admitting: Podiatry

## 2020-10-03 ENCOUNTER — Other Ambulatory Visit: Payer: Self-pay

## 2020-10-03 ENCOUNTER — Ambulatory Visit (INDEPENDENT_AMBULATORY_CARE_PROVIDER_SITE_OTHER): Payer: BC Managed Care – PPO

## 2020-10-03 ENCOUNTER — Encounter: Payer: Self-pay | Admitting: Podiatry

## 2020-10-03 ENCOUNTER — Ambulatory Visit: Payer: BC Managed Care – PPO | Admitting: Podiatry

## 2020-10-03 DIAGNOSIS — M7751 Other enthesopathy of right foot: Secondary | ICD-10-CM | POA: Diagnosis not present

## 2020-10-03 DIAGNOSIS — M2042 Other hammer toe(s) (acquired), left foot: Secondary | ICD-10-CM | POA: Diagnosis not present

## 2020-10-03 DIAGNOSIS — M2041 Other hammer toe(s) (acquired), right foot: Secondary | ICD-10-CM

## 2020-10-03 MED ORDER — TRIAMCINOLONE ACETONIDE 10 MG/ML IJ SUSP
10.0000 mg | Freq: Once | INTRAMUSCULAR | Status: AC
  Administered 2020-10-03: 10 mg

## 2020-10-03 NOTE — Progress Notes (Signed)
Subjective:   Patient ID: George [name], male   DOB: 70 y.o.   MRN: [id number]   HPI Patient presents stating he traumatized his right foot a few weeks ago both and the second toe has really elevated and is over top the big toe.  States it occurred spontaneously since that event and patient states that the left is mildly tender but not to the same degree with the right being quite sore.  Patient does not smoke and tries to be active and is interested in long-term orthotics   Review of Systems  All other systems reviewed and are negative.      Objective:  Physical Exam Vitals and nursing note reviewed.  Constitutional:      Appearance: He is well-developed.  Pulmonary:     Effort: Pulmonary effort is normal.  Musculoskeletal:        General: Normal range of motion.  Skin:    General: Skin is warm.  Neurological:     Mental Status: He is alert.    Neurovascular status intact muscle strength was found to be adequate range of motion adequate.  Patient's right second toe has elevated and medially dislocated with inflammation of the joint surface and there is obvious spontaneous event that occurred with moderate deformity of the left second toe.  Patient is found to have good digital perfusion well oriented x3     Assessment:  Probability for flexor plate dislocation right with rigid contracture digit to right pressing down on the MPJ with mild deformity of the left     Plan:  H&P x-rays reviewed conditions discussed again a focus on the acute inflammation and its possible that he will require at one point digital fusion shortening osteotomy and will probably require orthotics to reduce stress on the joint.  I went ahead today I anesthetized the right forefoot 60 mg like Marcaine mixture I did sterile prep I aspirated the second MPJ I got a small amount of clear fluid I injected quarter cc dexamethasone Kenalog apply thick plantar cushion with rigid bottom shoes reappoint to  recheck  X-rays indicate significant severe dorsal medial dislocation digit to right at the MPJ with mild deformity left

## 2020-10-10 NOTE — Progress Notes (Signed)
HPI: FU HOCM. A previous Myoview in July 2008 showed no ischemia or infarction. Abd ultrasound 3/18 showed no aneurysm. Exercise treadmill September 2018 showed no decrease in systolic blood pressure with exercise. Monitor November 2020 showed sinus bradycardia, normal sinus rhythm, PACs and rare PVC but no nonsustained ventricular tachycardia.  Echocardiogram October 2021 showed normal LV function, severe asymmetric septal hypertrophy, peak LV outflow tract gradient of 53 mmHg, grade 1 diastolic dysfunction, moderate left atrial enlargement, systolic anterior motion of the mitral valve and findings consistent with HOCM. Since last seen, the patient has dyspnea with more extreme activities but not with routine activities. It is relieved with rest. It is not associated with chest pain. There is no orthopnea, PND or pedal edema. There is no syncope or palpitations. There is no exertional chest pain.   Current Outpatient Medications  Medication Sig Dispense Refill   azelastine (ASTELIN) 0.1 % nasal spray Place 1 spray into both nostrils 2 (two) times daily. Use in each nostril as directed     fluticasone (FLONASE) 50 MCG/ACT nasal spray SPRAY 2 SPRAYS INTO EACH NOSTRIL EVERY DAY 48 mL 2   levocetirizine (XYZAL) 5 MG tablet 1 tablet in the evening     metoprolol succinate (TOPROL-XL) 25 MG 24 hr tablet Take 1 tablet (25 mg total) by mouth daily. 90 tablet 3   montelukast (SINGULAIR) 10 MG tablet Take 10 mg by mouth daily.     Multiple Vitamin (MULTIVITAMIN) tablet Take 1 tablet by mouth daily.     RESVERATROL PO Take 325 each by mouth.     No current facility-administered medications for this visit.     Past Medical History:  Diagnosis Date   Allergy    Colon polyp    Mucosal prolapse polyp   Diverticulosis    Essential hypertension 09/22/2008   Qualifier: Diagnosis of  By: Burnett Kanaris     External hemorrhoids    HOCM (hypertrophic obstructive cardiomyopathy) ([facility])     Hypertension    Hypertrophic obstructive cardiomyopathy ([place]) 09/24/2008   Qualifier: Diagnosis of  By: Stanford Breed, MD, Kandyce Rud    Irregular heart beat    VASOMOTOR RHINITIS 12/24/2008   Qualifier: Diagnosis of  By: Burnice Logan  MD, Doretha Sou    VENTRICULAR HYPERTROPHY, LEFT 09/22/2008   Qualifier: Diagnosis of  By: Burnett Kanaris      Past Surgical History:  Procedure Laterality Date   COLONOSCOPY     TONSILLECTOMY      Social History   Socioeconomic History   Marital status: Married    Spouse name: Not on file   Number of children: 0   Years of education: Not on file   Highest education level: Not on file  Occupational History   Occupation: Facilities manager  Tobacco Use   Smoking status: Former    Types: Cigarettes    Quit date: 05/06/1984    Years since quitting: 36.4   Smokeless tobacco: Never  Vaping Use   Vaping Use: Never used  Substance and Sexual Activity   Alcohol use: Not Currently    Alcohol/week: 0.0 standard drinks   Drug use: No   Sexual activity: Not Currently  Other Topics Concern   Not on file  Social History Narrative   Not on file   Social Determinants of Health   Financial Resource Strain: Not on file  Food Insecurity: Not on file  Transportation Needs: Not on file  Physical Activity: Not on file  Stress:  Not on file  Social Connections: Not on file  Intimate Partner Violence: Not on file    Family History  Problem Relation Age of Onset   Pancreatic cancer Father    Colon polyps Father        Benign   Colon cancer Neg Hx    Diabetes Neg Hx    Kidney disease Neg Hx    Gallbladder disease Neg Hx    Esophageal cancer Neg Hx    Rectal cancer Neg Hx    Stomach cancer Neg Hx     ROS: Residual fatigue from COVID in August but no fevers or chills, productive cough, hemoptysis, dysphasia, odynophagia, melena, hematochezia, dysuria, hematuria, rash, seizure activity, orthopnea, PND, pedal edema, claudication. Remaining systems  are negative.  Physical Exam: Well-developed well-nourished in no acute distress.  Skin is warm and dry.  HEENT is normal.  Neck is supple.  Chest is clear to auscultation with normal expansion.  Cardiovascular exam is regular rate and rhythm.  2/6 systolic murmur left sternal border that increases to 3/6 with Valsalva. Abdominal exam nontender or distended. No masses palpated. Extremities show no edema. neuro grossly intact  ECG-sinus bradycardia at a rate of 58, normal axis, left ventricular hypertrophy with repolarization abnormality.  Personally reviewed  A/P  1 hypertrophic obstructive cardiomyopathy-symptoms are unchanged.  Continue beta-blocker at present dose.  Repeat echocardiogram.  Given patient's age and no documented risk factors for sudden cardiac death we have not considered ICD.  2 hypertension-blood pressure controlled.  Continue present medications and follow.  Kirk Ruths, MD

## 2020-10-14 ENCOUNTER — Ambulatory Visit: Payer: BC Managed Care – PPO | Admitting: Cardiology

## 2020-10-14 ENCOUNTER — Other Ambulatory Visit: Payer: Self-pay

## 2020-10-14 ENCOUNTER — Encounter: Payer: Self-pay | Admitting: Cardiology

## 2020-10-14 VITALS — BP 130/80 | HR 57 | Ht 70.0 in | Wt 205.2 lb

## 2020-10-14 DIAGNOSIS — I421 Obstructive hypertrophic cardiomyopathy: Secondary | ICD-10-CM

## 2020-10-14 DIAGNOSIS — I10 Essential (primary) hypertension: Secondary | ICD-10-CM

## 2020-10-14 NOTE — Patient Instructions (Signed)

## 2020-11-01 ENCOUNTER — Ambulatory Visit (INDEPENDENT_AMBULATORY_CARE_PROVIDER_SITE_OTHER): Payer: BC Managed Care – PPO | Admitting: Podiatry

## 2020-11-01 ENCOUNTER — Other Ambulatory Visit: Payer: Self-pay

## 2020-11-01 DIAGNOSIS — M7751 Other enthesopathy of right foot: Secondary | ICD-10-CM

## 2020-11-01 DIAGNOSIS — M2041 Other hammer toe(s) (acquired), right foot: Secondary | ICD-10-CM | POA: Diagnosis not present

## 2020-11-01 DIAGNOSIS — M2042 Other hammer toe(s) (acquired), left foot: Secondary | ICD-10-CM

## 2020-11-04 ENCOUNTER — Other Ambulatory Visit: Payer: Self-pay

## 2020-11-04 ENCOUNTER — Ambulatory Visit (HOSPITAL_COMMUNITY): Payer: BC Managed Care – PPO | Attending: Cardiovascular Disease

## 2020-11-04 DIAGNOSIS — I421 Obstructive hypertrophic cardiomyopathy: Secondary | ICD-10-CM | POA: Diagnosis not present

## 2020-11-04 LAB — ECHOCARDIOGRAM COMPLETE
AV Mean grad: 12.8 mmHg
AV Peak grad: 22.4 mmHg
Ao pk vel: 2.37 m/s
Area-P 1/2: 2.6 cm2
S' Lateral: 2.8 cm

## 2020-11-04 NOTE — Progress Notes (Signed)
Subjective:   Patient ID: George [name], male   DOB: 70 y.o.   MRN: [id number]   HPI Patient states its improved with occasional zinging like pains but better than it was prior   ROS      Objective:  Physical Exam  Neurovascular status intact with moderate rigid elevation second digit right over left with inflammation of the inner phalangeal joint keratotic tissue formation     Assessment:  Inflammatory capsulitis of the dorsum of the second digit right at the inner phalangeal joint     Plan:  H&P reviewed condition and discussed digital fusion educating patient on fusion procedure to reduce the pressure and the inflammation the patient experiences occur.  Patient most likely will do this at 1 point in future but at this point we will use padding trimming techniques and mesh type shoe gear with wide toe boxes

## 2020-12-04 ENCOUNTER — Ambulatory Visit: Payer: BC Managed Care – PPO

## 2020-12-04 ENCOUNTER — Other Ambulatory Visit: Payer: Self-pay

## 2020-12-04 DIAGNOSIS — M2041 Other hammer toe(s) (acquired), right foot: Secondary | ICD-10-CM

## 2020-12-04 DIAGNOSIS — M7751 Other enthesopathy of right foot: Secondary | ICD-10-CM

## 2020-12-04 DIAGNOSIS — M2042 Other hammer toe(s) (acquired), left foot: Secondary | ICD-10-CM

## 2020-12-04 NOTE — Progress Notes (Signed)
SITUATION: Reason for Visit: Fitting and Delivery of Custom Fabricated Foot Orthoses Patient Report: Patient reports comfort and is satisfied with device.  OBJECTIVE DATA: Patient History / Diagnosis:  No change in pathology Provided Device:  Custom functional foot orthotics  GOAL OF ORTHOSIS - Improve gait - Decrease energy expenditure - Improve Balance - Provide Triplanar stability of foot complex - Facilitate motion  ACTIONS PERFORMED Patient was fit with functional foot orthotics trimmed to shoe last. Patient tolerated fittign procedure. Device was modified as follows to better fit patient: - Toe plate was trimmed to shoe last  Patient was provided with verbal and written instruction and demonstration regarding donning, doffing, wear, care, proper fit, function, purpose, cleaning, and use of the orthosis and in all related precautions and risks and benefits regarding the orthosis.  Patient was also provided with verbal instruction regarding how to report any failures or malfunctions of the orthosis and necessary follow up care. Patient was also instructed to contact our office regarding any change in status that may affect the function of the orthosis.  Patient demonstrated independence with proper donning, doffing, and fit and verbalized understanding of all instructions.  PLAN: Patient is to follow up in one week or as necessary (PRN). All questions were answered and concerns addressed. Plan of care was discussed with and agreed upon by the patient.

## 2020-12-09 ENCOUNTER — Other Ambulatory Visit: Payer: Self-pay | Admitting: Cardiology

## 2020-12-25 ENCOUNTER — Telehealth: Payer: Self-pay | Admitting: Internal Medicine

## 2020-12-25 NOTE — Telephone Encounter (Signed)
Patient calling in with respiratory symptoms: Shortness of breath, chest pain, palpitations or other red words send to Triage  Does the patient have a fever over 100, cough, congestion, sore throat, runny nose, lost of taste/smell (please list symptoms that patient has)?chest and sinus congestion  What date did symptoms start?Thanksgiving week (If over 5 days ago, pt may be scheduled for in person visit)  Have you tested for Covid in the last 5 days? Yes   If yes, was it positive OR negative [x] ? If positive in the last 5 days, please schedule virtual visit now. If negative, schedule for an in person OV with the next available provider if PCP has no openings. Please also let patient know they will be tested again (follow the script below)  "you will have to arrive 59mins prior to your appt time to be Covid tested. Please park in back of office at the [facility] & call [phone] to let the staff know you have arrived. A staff member will meet you at your car to do a rapid covid test. Once the test has resulted you will be notified by phone of your results to determine if appt will remain an in person visit or be converted to a virtual/phone visit. If you arrive less than 33mins before your appt time, your visit will be automatically converted to virtual & any recommended testing will happen AFTER the visit."   [place]  If no availability for virtual visit in office,  please schedule another [place] office  If no availability at another [place] office, please instruct patient that they can schedule an evisit or virtual visit through their mychart account. Visits up to 8pm  patients can be seen in office 5 days after positive COVID test

## 2020-12-27 ENCOUNTER — Ambulatory Visit: Payer: BC Managed Care – PPO | Admitting: Adult Health

## 2020-12-27 ENCOUNTER — Encounter: Payer: Self-pay | Admitting: Adult Health

## 2020-12-27 VITALS — BP 122/90 | HR 71 | Temp 98.7°F | Ht 70.0 in | Wt 199.0 lb

## 2020-12-27 DIAGNOSIS — J0140 Acute pansinusitis, unspecified: Secondary | ICD-10-CM | POA: Diagnosis not present

## 2020-12-27 MED ORDER — DOXYCYCLINE HYCLATE 100 MG PO CAPS: 100.0000 mg | ORAL_CAPSULE | Freq: Two times a day (BID) | ORAL | 0 refills | Status: DC

## 2020-12-27 NOTE — Progress Notes (Signed)
Subjective:    Patient ID: George [name], male    DOB: 24-Feb-1950, 70 y.o.   MRN: [id number]  HPI 70 year old male who  has a past medical history of Allergy, Colon polyp, Diverticulosis, Essential hypertension (09/22/2008), External hemorrhoids, HOCM (hypertrophic obstructive cardiomyopathy) ([place]), Hypertension, Hypertrophic obstructive cardiomyopathy ([place]) (09/24/2008), Irregular heart beat, VASOMOTOR RHINITIS (12/24/2008), and VENTRICULAR HYPERTROPHY, LEFT (09/22/2008).  He is a patient of Dr. Jerilee Hoh who I am seeing today for an acute issue of concern for sinus infection.   His symptoms started about three weeks ago. Symptoms consist of sinus congestion, sinus pressure, fatigue and productive cough.  His symptoms seem to be worse at night when he is laying down.   He denies fevers/chills/shortness of breath/wheezing   He has been using his allergy medications without much improvement   Review of Systems See HPI   Past Medical History:  Diagnosis Date   Allergy    Colon polyp    Mucosal prolapse polyp   Diverticulosis    Essential hypertension 09/22/2008   Qualifier: Diagnosis of  By: Burnett Kanaris     External hemorrhoids    HOCM (hypertrophic obstructive cardiomyopathy) ([facility])    Hypertension    Hypertrophic obstructive cardiomyopathy ([place]) 09/24/2008   Qualifier: Diagnosis of  By: Stanford Breed, MD, Kandyce Rud    Irregular heart beat    VASOMOTOR RHINITIS 12/24/2008   Qualifier: Diagnosis of  By: Burnice Logan  MD, Doretha Sou    VENTRICULAR HYPERTROPHY, LEFT 09/22/2008   Qualifier: Diagnosis of  By: Burnett Kanaris      Social History   Socioeconomic History   Marital status: Married    Spouse name: Not on file   Number of children: 0   Years of education: Not on file   Highest education level: Bachelor's degree (e.g., BA, AB, BS)  Occupational History   Occupation: Facilities manager  Tobacco Use   Smoking status: Former    Types: Cigarettes    Quit date:  05/06/1984    Years since quitting: 36.6   Smokeless tobacco: Never  Vaping Use   Vaping Use: Never used  Substance and Sexual Activity   Alcohol use: Not Currently    Alcohol/week: 0.0 standard drinks   Drug use: No   Sexual activity: Not Currently  Other Topics Concern   Not on file  Social History Narrative   Not on file   Social Determinants of Health   Financial Resource Strain: Low Risk    Difficulty of Paying Living Expenses: Not hard at all  Food Insecurity: No Food Insecurity   Worried About Charity fundraiser in the Last Year: Never true   [place] in the Last Year: Never true  Transportation Needs: No Transportation Needs   Lack of Transportation (Medical): No   Lack of Transportation (Non-Medical): No  Physical Activity: Insufficiently Active   Days of Exercise per Week: 3 days   Minutes of Exercise per Session: 30 min  Stress: No Stress Concern Present   Feeling of Stress : Only a little  Social Connections: Unknown   Frequency of Communication with Friends and Family: Patient refused   Frequency of Social Gatherings with Friends and Family: Patient refused   Attends Religious Services: Patient refused   Marine scientist or Organizations: Patient refused   Attends Archivist Meetings: Not on file   Marital Status: Married  Human resources officer Violence: Not on file    Past Surgical History:  Procedure Laterality Date   COLONOSCOPY     TONSILLECTOMY      Family History  Problem Relation Age of Onset   Pancreatic cancer Father    Colon polyps Father        Benign   Colon cancer Neg Hx    Diabetes Neg Hx    Kidney disease Neg Hx    Gallbladder disease Neg Hx    Esophageal cancer Neg Hx    Rectal cancer Neg Hx    Stomach cancer Neg Hx     Allergies  Allergen Reactions   Sulfonamide Derivatives     Current Outpatient Medications on File Prior to Visit  Medication Sig Dispense Refill   azelastine (ASTELIN) 0.1 % nasal spray  Place 1 spray into both nostrils 2 (two) times daily. Use in each nostril as directed     fluticasone (FLONASE) 50 MCG/ACT nasal spray SPRAY 2 SPRAYS INTO EACH NOSTRIL EVERY DAY 48 mL 2   levocetirizine (XYZAL) 5 MG tablet 1 tablet in the evening     metoprolol succinate (TOPROL-XL) 25 MG 24 hr tablet TAKE 1 TABLET BY MOUTH EVERY DAY 90 tablet 3   montelukast (SINGULAIR) 10 MG tablet Take 10 mg by mouth daily.     Multiple Vitamin (MULTIVITAMIN) tablet Take 1 tablet by mouth daily.     RESVERATROL PO Take 325 each by mouth.     No current facility-administered medications on file prior to visit.    BP 122/90    Pulse 71    Temp 98.7 F (37.1 C) (Oral)    Ht 5\' 10"  (1.778 m)    Wt 199 lb (90.3 kg)    SpO2 98%    BMI 28.55 kg/m       Objective:   Physical Exam Vitals and nursing note reviewed.  Constitutional:      Appearance: Normal appearance.  HENT:     Nose: Congestion and rhinorrhea present. Rhinorrhea is purulent.     Right Turbinates: Swollen.     Left Turbinates: Swollen.     Mouth/Throat:     Mouth: Mucous membranes are moist.     Pharynx: Oropharyngeal exudate present.     Tonsils: No tonsillar exudate or tonsillar abscesses.  Cardiovascular:     Rate and Rhythm: Normal rate and regular rhythm.     Pulses: Normal pulses.     Heart sounds: Normal heart sounds.  Pulmonary:     Effort: Pulmonary effort is normal.     Breath sounds: Normal breath sounds.  Neurological:     Mental Status: He is alert.  Psychiatric:        Mood and Affect: Mood normal.        Behavior: Behavior normal.        Thought Content: Thought content normal.      Assessment & Plan:   1. Acute non-recurrent pansinusitis - Will treat due to symptoms and duration  - doxycycline (VIBRAMYCIN) 100 MG capsule; Take 1 capsule (100 mg total) by mouth 2 (two) times daily.  Dispense: 14 capsule; Refill: 0   Dorothyann Peng, NP

## 2021-03-18 ENCOUNTER — Encounter: Payer: Self-pay | Admitting: Internal Medicine

## 2021-03-18 ENCOUNTER — Ambulatory Visit (INDEPENDENT_AMBULATORY_CARE_PROVIDER_SITE_OTHER): Payer: BC Managed Care – PPO | Admitting: Internal Medicine

## 2021-03-18 VITALS — BP 130/80 | HR 64 | Temp 98.3°F | Ht 70.0 in | Wt 203.6 lb

## 2021-03-18 DIAGNOSIS — R4589 Other symptoms and signs involving emotional state: Secondary | ICD-10-CM

## 2021-03-18 DIAGNOSIS — I10 Essential (primary) hypertension: Secondary | ICD-10-CM

## 2021-03-18 DIAGNOSIS — E782 Mixed hyperlipidemia: Secondary | ICD-10-CM

## 2021-03-18 DIAGNOSIS — I421 Obstructive hypertrophic cardiomyopathy: Secondary | ICD-10-CM | POA: Diagnosis not present

## 2021-03-18 DIAGNOSIS — Z125 Encounter for screening for malignant neoplasm of prostate: Secondary | ICD-10-CM | POA: Diagnosis not present

## 2021-03-18 DIAGNOSIS — Z0000 Encounter for general adult medical examination without abnormal findings: Secondary | ICD-10-CM | POA: Diagnosis not present

## 2021-03-18 DIAGNOSIS — K648 Other hemorrhoids: Secondary | ICD-10-CM

## 2021-03-18 DIAGNOSIS — J302 Other seasonal allergic rhinitis: Secondary | ICD-10-CM

## 2021-03-18 LAB — COMPREHENSIVE METABOLIC PANEL
ALT: 13 U/L (ref 0–53)
AST: 20 U/L (ref 0–37)
Albumin: 4.5 g/dL (ref 3.5–5.2)
Alkaline Phosphatase: 38 U/L — ABNORMAL LOW (ref 39–117)
BUN: 20 mg/dL (ref 6–23)
CO2: 30 mEq/L (ref 19–32)
Calcium: 9.7 mg/dL (ref 8.4–10.5)
Chloride: 101 mEq/L (ref 96–112)
Creatinine, Ser: 0.99 mg/dL (ref 0.40–1.50)
GFR: 77.91 mL/min (ref >60.00)
Glucose, Bld: 102 mg/dL — ABNORMAL HIGH (ref 70–99)
Potassium: 4.6 mEq/L (ref 3.5–5.1)
Sodium: 138 mEq/L (ref 135–145)
Total Bilirubin: 0.7 mg/dL (ref 0.2–1.2)
Total Protein: 6.5 g/dL (ref 6.0–8.3)

## 2021-03-18 LAB — CBC WITH DIFFERENTIAL/PLATELET
Basophils Absolute: 0 10*3/uL (ref 0.0–0.1)
Basophils Relative: 0.6 % (ref 0.0–3.0)
Eosinophils Absolute: 0.2 10*3/uL (ref 0.0–0.7)
Eosinophils Relative: 3.4 % (ref 0.0–5.0)
HCT: 39.5 % (ref 39.0–52.0)
Hemoglobin: 13.8 g/dL (ref 13.0–17.0)
Lymphocytes Relative: 21.8 % (ref 12.0–46.0)
Lymphs Abs: 1.4 10*3/uL (ref 0.7–4.0)
MCHC: 34.9 g/dL (ref 30.0–36.0)
MCV: 85.1 fl (ref 78.0–100.0)
Monocytes Absolute: 0.6 10*3/uL (ref 0.1–1.0)
Monocytes Relative: 9 % (ref 3.0–12.0)
Neutro Abs: 4 10*3/uL (ref 1.4–7.7)
Neutrophils Relative %: 65.2 % (ref 43.0–77.0)
Platelets: 166 10*3/uL (ref 150.0–400.0)
RBC: 4.64 Mil/uL (ref 4.22–5.81)
RDW: 13.9 % (ref 11.5–15.5)
WBC: 6.2 10*3/uL (ref 4.0–10.5)

## 2021-03-18 LAB — LIPID PANEL
Cholesterol: 169 mg/dL (ref 0–200)
HDL: 54.8 mg/dL (ref >39.00)
LDL Cholesterol: 98 mg/dL (ref 0–99)
NonHDL: 114.01
Total CHOL/HDL Ratio: 3
Triglycerides: 78 mg/dL (ref 0.0–149.0)
VLDL: 15.6 mg/dL (ref 0.0–40.0)

## 2021-03-18 LAB — PSA: PSA: 0.44 ng/mL (ref 0.10–4.00)

## 2021-03-18 LAB — HEMOGLOBIN A1C: Hgb A1c MFr Bld: 5.2 % (ref 4.6–6.5)

## 2021-03-18 LAB — VITAMIN D 25 HYDROXY (VIT D DEFICIENCY, FRACTURES): VITD: 40.01 ng/mL (ref 30.00–100.00)

## 2021-03-18 LAB — VITAMIN B12: Vitamin B-12: 486 pg/mL (ref 211–911)

## 2021-03-18 LAB — TSH: TSH: 1.1 u[IU]/mL (ref 0.35–5.50)

## 2021-03-18 MED ORDER — LEVOCETIRIZINE DIHYDROCHLORIDE 5 MG PO TABS: ORAL_TABLET | ORAL | 1 refills | Status: DC

## 2021-03-18 MED ORDER — HYDROCORTISONE ACETATE 25 MG RE SUPP: 25.0000 mg | Freq: Two times a day (BID) | RECTAL | 2 refills | Status: DC

## 2021-03-18 NOTE — Patient Instructions (Signed)
-  Nice seeing you today!!  -Lab work today; will notify you once results are available.  -See you back in 6 months! 

## 2021-03-18 NOTE — Progress Notes (Signed)
? ? ? ?Established Patient Office Visit ? ? ? ? ?This visit occurred during the SARS-CoV-2 public health emergency.  Safety protocols were in place, including screening questions prior to the visit, additional usage of staff PPE, and extensive cleaning of exam room while observing appropriate contact time as indicated for disinfecting solutions.  ? ? ?CC/Reason for Visit: Annual preventive exam ? ?HPI: George [name] is a 71 y.o. male who is coming in today for the above mentioned reasons. Past Medical History is significant for: Hypertension and hypertrophic cardiomyopathy followed by cardiology.  He has been having a lot of social and familial stressors that are leading to depressed mood.  He is requiring about counseling.  He has routine eye and dental care.  He is still working full-time.  He is overdue for flu vaccine and COVID booster but declines.  He had a colonoscopy in 2021.  He has a history of internal hemorrhoids and is requesting a refill of Anusol suppositories. ? ? ?Past Medical/Surgical History: ?Past Medical History:  ?Diagnosis Date  ? Allergy   ? Colon polyp   ? Mucosal prolapse polyp  ? Diverticulosis   ? Essential hypertension 09/22/2008  ? Qualifier: Diagnosis of  By: Burnett Kanaris    ? External hemorrhoids   ? HOCM (hypertrophic obstructive cardiomyopathy) ([place])   ? Hypertension   ? Hypertrophic obstructive cardiomyopathy ([place]) 09/24/2008  ? Qualifier: Diagnosis of  By: Stanford Breed, MD, Kandyce Rud   ? Irregular heart beat   ? VASOMOTOR RHINITIS 12/24/2008  ? Qualifier: Diagnosis of  By: Burnice Logan  MD, Doretha Sou   ? VENTRICULAR HYPERTROPHY, LEFT 09/22/2008  ? Qualifier: Diagnosis of  By: Burnett Kanaris    ? ? ?Past Surgical History:  ?Procedure Laterality Date  ? COLONOSCOPY    ? TONSILLECTOMY    ? ? ?Social History: ? reports that he quit smoking about 36 years ago. His smoking use included cigarettes. He has never used smokeless tobacco. He reports that he does not currently use  alcohol. He reports that he does not use drugs. ? ?Allergies: ?Allergies  ?Allergen Reactions  ? Sulfonamide Derivatives   ? ? ?Family History:  ?Family History  ?Problem Relation Age of Onset  ? Pancreatic cancer Father   ? Colon polyps Father   ?     Benign  ? Colon cancer Neg Hx   ? Diabetes Neg Hx   ? Kidney disease Neg Hx   ? Gallbladder disease Neg Hx   ? Esophageal cancer Neg Hx   ? Rectal cancer Neg Hx   ? Stomach cancer Neg Hx   ? ? ? ?Current Outpatient Medications:  ?  azelastine (ASTELIN) 0.1 % nasal spray, Place 1 spray into both nostrils 2 (two) times daily. Use in each nostril as directed, Disp: , Rfl:  ?  fluticasone (FLONASE) 50 MCG/ACT nasal spray, SPRAY 2 SPRAYS INTO EACH NOSTRIL EVERY DAY, Disp: 48 mL, Rfl: 2 ?  hydrocortisone (ANUSOL-HC) 25 MG suppository, Place 1 suppository (25 mg total) rectally 2 (two) times daily., Disp: 12 suppository, Rfl: 2 ?  metoprolol succinate (TOPROL-XL) 25 MG 24 hr tablet, TAKE 1 TABLET BY MOUTH EVERY DAY, Disp: 90 tablet, Rfl: 3 ?  montelukast (SINGULAIR) 10 MG tablet, Take 10 mg by mouth daily., Disp: , Rfl:  ?  Multiple Vitamin (MULTIVITAMIN) tablet, Take 1 tablet by mouth daily., Disp: , Rfl:  ?  RESVERATROL PO, Take 325 each by mouth., Disp: , Rfl:  ?  levocetirizine (XYZAL)  5 MG tablet, 1 tablet in the evening, Disp: 90 tablet, Rfl: 1 ? ?Review of Systems:  ?Constitutional: Denies fever, chills, diaphoresis, appetite change and fatigue.  ?HEENT: Denies photophobia, eye pain, redness, hearing loss, ear pain, congestion, sore throat, rhinorrhea, sneezing, mouth sores, trouble swallowing, neck pain, neck stiffness and tinnitus.   ?Respiratory: Denies SOB, DOE, cough, chest tightness,  and wheezing.   ?Cardiovascular: Denies chest pain, palpitations and leg swelling.  ?Gastrointestinal: Denies nausea, vomiting, abdominal pain, diarrhea, constipation, blood in stool and abdominal distention.  ?Genitourinary: Denies dysuria, urgency, frequency, hematuria, flank  pain and difficulty urinating.  ?Endocrine: Denies: hot or cold intolerance, sweats, changes in hair or nails, polyuria, polydipsia. ?Musculoskeletal: Denies myalgias, back pain, joint swelling, arthralgias and gait problem.  ?Skin: Denies pallor, rash and wound.  ?Neurological: Denies dizziness, seizures, syncope, weakness, light-headedness, numbness and headaches.  ?Hematological: Denies adenopathy. Easy bruising, personal or family bleeding history  ?Psychiatric/Behavioral: Denies suicidal ideation,  confusion, nervousness, sleep disturbance and agitation ? ? ? ?Physical Exam: ?Vitals:  ? 03/18/21 0841  ?BP: 130/80  ?Pulse: 64  ?Temp: 98.3 ?F (36.8 ?C)  ?TempSrc: Oral  ?SpO2: 98%  ?Weight: 203 lb 9.6 oz (92.4 kg)  ?Height: '5\' 10"'$  (1.778 m)  ? ? ?Body mass index is 29.21 kg/m?. ? ? ?Constitutional: NAD, calm, comfortable ?Eyes: PERRL, lids and conjunctivae normal ?ENMT: Mucous membranes are moist. Posterior pharynx clear of any exudate or lesions. Normal dentition. Tympanic membrane is pearly white, no erythema or bulging. ?Neck: normal, supple, no masses, no thyromegaly ?Respiratory: clear to auscultation bilaterally, no wheezing, no crackles. Normal respiratory effort. No accessory muscle use.  ?Cardiovascular: Regular rate and rhythm, no murmurs / rubs / gallops. No extremity edema. 2+ pedal pulses. No carotid bruits.  ?Abdomen: no tenderness, no masses palpated. No hepatosplenomegaly. Bowel sounds positive.  ?Musculoskeletal: no clubbing / cyanosis. No joint deformity upper and lower extremities. Good ROM, no contractures. Normal muscle tone.  ?Skin: no rashes, lesions, ulcers. No induration ?Neurologic: CN 2-12 grossly intact. Sensation intact, DTR normal. Strength 5/5 in all 4.  ?Psychiatric: Normal judgment and insight. Alert and oriented x 3. Normal mood.  ? ? ?Impression and Plan: ? ?Encounter for preventive health examination  ?-Recommend routine eye and dental care. ?-Immunizations: Overdue for flu and  COVID booster which he declines today despite counseling ?-Healthy lifestyle discussed in detail. ?-Labs to be updated today. ?-Colon cancer screening: 04/2019, 5-year callback ?-Breast cancer screening: Not applicable ?-Cervical cancer screening: Not applicable  ?-Lung cancer screening: Not applicable ?-Prostate cancer screening: PSA ?-DEXA: Not applicable ? ?Mixed hyperlipidemia ? - Plan: Lipid panel ? ?Essential hypertension ?- Plan: CBC with Differential/Platelet, Comprehensive metabolic panel ?-Fair control ? ?Hypertrophic obstructive cardiomyopathy ([place]) ?-Followed by cardiology. ? ?Seasonal allergic rhinitis, unspecified trigger ? - Plan: levocetirizine (XYZAL) 5 MG tablet ? ?Depressed mood ? - Plan: Hemoglobin A1c, VITAMIN D 25 Hydroxy (Vit-D Deficiency, Fractures), Vitamin B12, TSH ?-He will be referred for CBT sessions, rule out hypothyroidism, B12 deficiency. ? ?Internal hemorrhoids  ?- Plan: hydrocortisone (ANUSOL-HC) 25 MG suppository ? ? ? ?Patient Instructions  ?-Nice seeing you today!! ? ?-Lab work today; will notify you once results are available. ? ?-See you back in 6 months. ? ? ? ?Lelon Frohlich, MD ?[facility] ? ? ?

## 2021-04-21 ENCOUNTER — Telehealth: Payer: BC Managed Care – PPO | Admitting: Family Medicine

## 2021-04-21 DIAGNOSIS — R21 Rash and other nonspecific skin eruption: Secondary | ICD-10-CM | POA: Diagnosis not present

## 2021-04-21 MED ORDER — PREDNISONE 10 MG PO TABS: ORAL_TABLET | ORAL | 0 refills | Status: DC

## 2021-04-21 NOTE — Patient Instructions (Signed)
-I sent the medication(s) we discussed to your pharmacy: ?Meds ordered this encounter  ?Medications  ? predniSONE (DELTASONE) 10 MG tablet  ?  Sig: 5 tablets ('50mg'$ ) daily for 3 days, then 4 tablets (40 mg) daily for 3 days, then 3 tablets ('30mg'$ ) daily for 3 days, then 2 tablets (20 mg) daily for 3 days, then 1 tablet (10 mg) daily for 3 days.  ?  Dispense:  45 tablet  ?  Refill:  0  ? ? ? ?I hope you are feeling better soon! ? ?Seek in person care promptly if your symptoms worsen, new concerns arise or you are not improving with treatment. ? ?It was nice to meet you today. I help [place] out with telemedicine visits on Tuesdays and Thursdays and am happy to help if you need a virtual follow up visit on those days. Otherwise, if you have any concerns or questions following this visit please schedule a follow up visit with your Primary Care office or seek care at a local urgent care clinic to avoid delays in care ? ? ?Prednisone Delayed-Release Tablets ?What is this medication? ?PREDNISONE (PRED ni sone) treats many conditions such as asthma, allergic reactions, arthritis, inflammatory bowel diseases, adrenal, and blood or bone marrow disorders. It works by decreasing inflammation, slowing down an overactive immune system, or replacing cortisol normally made in the body. Cortisol is a hormone that plays an important role in how the body responds to stress, illness, and injury. It belongs to a group of medications called steroids. ?This medicine may be used for other purposes; ask your health care provider or pharmacist if you have questions. ?COMMON BRAND NAME(S): RAYOS ?What should I tell my care team before I take this medication? ?They need to know if you have any of these conditions: ?Cushing's syndrome ?Diabetes ?Glaucoma ?Heart disease ?High blood pressure ?Infection (especially a virus infection such as chickenpox, cold sores, or herpes) ?Kidney disease ?Liver disease ?Mental illness ?Myasthenia  gravis ?Osteoporosis ?Seizures ?Stomach or intestine problems ?Thyroid disease ?An unusual or allergic reaction to lactose, prednisone, other medications, foods, dyes, or preservatives ?Pregnant or trying to get pregnant ?Breast-feeding ?How should I use this medication? ?Take this medication by mouth with a glass of water. Follow the directions on the prescription label. Take this medication with food. Do not cut, crush or chew this medication. Do not suddenly stop taking your medication because you may develop a severe reaction. If your care team wants you to stop the medication, the dose may be slowly lowered over time to avoid any side effects. ?Talk to your care team about the use of this medication in children. Special care may be needed. ?Overdosage: If you think you have taken too much of this medicine contact a poison control center or emergency room at once. ?NOTE: This medicine is only for you. Do not share this medicine with others. ?What if I miss a dose? ?If you miss a dose, take it as soon as you can. If it is almost time for your next dose, talk to your care team. You may need to miss a dose or take an extra dose. Do not take double or extra doses without advice. ?What may interact with this medication? ?Do not take this medication with any of the following: ?Metyrapone ?Mifepristone ?This medication may also interact with the following: ?Aminoglutethimide ?Amphotericin B ?Aspirin and aspirin-like medications ?Barbiturates ?Certain medications for diabetes, like glipizide or glyburide ?Cholestyramine ?Cholinesterase inhibitors ?Cyclosporine ?Digoxin ?Diuretics ?Ephedrine ?Male hormones, like estrogens  and birth control pills ?Isoniazid ?Ketoconazole ?NSAIDS, medications for pain and inflammation, like ibuprofen or naproxen ?Phenytoin ?Rifampin ?Toxoids ?Vaccines ?Warfarin ?This list may not describe all possible interactions. Give your health care provider a list of all the medicines, herbs,  non-prescription drugs, or dietary supplements you use. Also tell them if you smoke, drink alcohol, or use illegal drugs. Some items may interact with your medicine. ?What should I watch for while using this medication? ?Visit your care team for regular checks on your progress. If you are taking this medication over a prolonged period, carry an identification card with your name and address, the type and dose of your medication, and your care team's name and address. ?This medication may increase your risk of getting an infection. Tell your care team if you are around anyone with measles or chickenpox, or if you develop sores or blisters that do not heal properly. ?If you are going to have surgery, tell your care team that you have taken this medication within the last twelve months. ?Ask your care team about your diet. You may need to lower the amount of salt you eat. ?This medication may increase blood sugar. Ask your care team if changes in diet or medications are needed if you have diabetes. ?What side effects may I notice from receiving this medication? ?Side effects that you should report to your care team as soon as possible: ?Allergic reactions--skin rash, itching, hives, swelling of the face, lips, tongue, or throat ?Cushing syndrome--increased fat around the midsection, upper back, neck, or face, pink or purple stretch marks on the skin, thinning, fragile skin that easily bruises, unexpected hair growth ?High blood sugar (hyperglycemia)--increased thirst or amount of urine, unusual weakness or fatigue, blurry vision ?Increase in blood pressure ?Infection--fever, chills, cough, sore throat, wounds that don't heal, pain or trouble when passing urine, general feeling of discomfort or being unwell ?Low adrenal gland function--nausea, vomiting, loss of appetite, unusual weakness or fatigue, dizziness ?Mood and behavior changes--anxiety, nervousness, confusion, hallucinations, irritability, hostility, thoughts  of suicide or self-harm, worsening mood, feelings of depression ?Stomach bleeding--bloody or black, tar-like stools, vomiting blood or brown material that looks like coffee grounds ?Swelling of the ankles, hands, or feet ?Side effects that usually do not require medical attention (report to your care team if they continue or are bothersome): ?Acne ?General discomfort and fatigue ?Headache ?Increase in appetite ?Nausea ?Trouble sleeping ?Weight gain ?This list may not describe all possible side effects. Call your doctor for medical advice about side effects. You may report side effects to FDA at 1-800-FDA-1088. ?Where should I keep my medication? ?Keep out of the reach of children. ?Store at room temperature between 15 and 30 degrees C (59 and 86 degrees F). Protect from light and moisture. Keep container tightly closed. Throw away any unused medication after the expiration date. ?NOTE: This sheet is a summary. It may not cover all possible information. If you have questions about this medicine, talk to your doctor, pharmacist, or health care provider. ?? 2022 Elsevier/Gold Standard (2020-03-29 00:00:00) ? ?

## 2021-04-21 NOTE — Progress Notes (Signed)
Virtual Visit via Video Note ? ?I connected with George [name] ? on 04/21/21 at  3:00 PM EDT by a video enabled telemedicine application and verified that I am speaking with the correct person using two identifiers. ? Location patient: [place] ?Location provider:work or home office ?Persons participating in the virtual visit: patient, provider ? ?I discussed the limitations and requested verbal permission for telemedicine visit. The patient expressed understanding and agreed to proceed. ? ? ?HPI: ? ?Acute telemedicine visit for "poison ivy": ?-Onset: has been in poison ivy with trimming trees along the driveway ?-Symptoms include: now with itchy vesicular rash on hands and arms, now spread to waste and legs, a little on his lower cheek as well ?-Denies: CP, SOB, fever, lesion in or near mucus membranes/eye ?-Has tried:using OTC ivyrest, taking antihistamine ?-Pertinent past medical history: see below ?-Pertinent medication allergies: ?Allergies  ?Allergen Reactions  ? Sulfonamide Derivatives   ?-COVID-19 vaccine status:  ?Immunization History  ?Administered Date(s) Administered  ? PFIZER(Purple Top)SARS-COV-2 Vaccination 02/25/2019, 03/22/2019, 11/11/2019  ? Pneumococcal Conjugate-13 08/10/2017  ? Pneumococcal Polysaccharide-23 09/13/2018, 12/04/2019  ? Tdap 02/14/2020  ? Zoster Recombinat (Shingrix) 09/17/2017, 11/26/2017  ? ? ? ?ROS: See pertinent positives and negatives per HPI. ? ?Past Medical History:  ?Diagnosis Date  ? Allergy   ? Colon polyp   ? Mucosal prolapse polyp  ? Diverticulosis   ? Essential hypertension 09/22/2008  ? Qualifier: Diagnosis of  By: Burnett Kanaris    ? External hemorrhoids   ? HOCM (hypertrophic obstructive cardiomyopathy) ([place])   ? Hypertension   ? Hypertrophic obstructive cardiomyopathy ([place]) 09/24/2008  ? Qualifier: Diagnosis of  By: Stanford Breed, MD, Kandyce Rud   ? Irregular heart beat   ? VASOMOTOR RHINITIS 12/24/2008  ? Qualifier: Diagnosis of  By: Burnice Logan  MD, Doretha Sou   ?  VENTRICULAR HYPERTROPHY, LEFT 09/22/2008  ? Qualifier: Diagnosis of  By: Burnett Kanaris    ? ? ?Past Surgical History:  ?Procedure Laterality Date  ? COLONOSCOPY    ? TONSILLECTOMY    ? ? ? ?Current Outpatient Medications:  ?  azelastine (ASTELIN) 0.1 % nasal spray, Place 1 spray into both nostrils 2 (two) times daily. Use in each nostril as directed, Disp: , Rfl:  ?  fluticasone (FLONASE) 50 MCG/ACT nasal spray, SPRAY 2 SPRAYS INTO EACH NOSTRIL EVERY DAY, Disp: 48 mL, Rfl: 2 ?  hydrocortisone (ANUSOL-HC) 25 MG suppository, Place 1 suppository (25 mg total) rectally 2 (two) times daily., Disp: 12 suppository, Rfl: 2 ?  levocetirizine (XYZAL) 5 MG tablet, 1 tablet in the evening, Disp: 90 tablet, Rfl: 1 ?  metoprolol succinate (TOPROL-XL) 25 MG 24 hr tablet, TAKE 1 TABLET BY MOUTH EVERY DAY, Disp: 90 tablet, Rfl: 3 ?  montelukast (SINGULAIR) 10 MG tablet, Take 10 mg by mouth daily., Disp: , Rfl:  ?  Multiple Vitamin (MULTIVITAMIN) tablet, Take 1 tablet by mouth daily., Disp: , Rfl:  ?  predniSONE (DELTASONE) 10 MG tablet, 5 tablets ('50mg'$ ) daily for 3 days, then 4 tablets (40 mg) daily for 3 days, then 3 tablets ('30mg'$ ) daily for 3 days, then 2 tablets (20 mg) daily for 3 days, then 1 tablet (10 mg) daily for 3 days., Disp: 45 tablet, Rfl: 0 ?  RESVERATROL PO, Take 325 each by mouth., Disp: , Rfl:  ? ?EXAM: ? ?VITALS per patient if applicable: ? ?GENERAL: alert, oriented, appears well and in no acute distress ? ?HEENT: atraumatic, conjunttiva clear, no obvious abnormalities on inspection of external nose and  ears ? ?NECK: normal movements of the head and neck ? ?LUNGS: on inspection no signs of respiratory distress, breathing rate appears normal, no obvious gross SOB, gasping or wheezing ? ?CV: no obvious cyanosis ? ?SKIN: he shows me some of the lesions on his arms, lower face - poor video quality but can make out patches of what looks to be erythematous papulovesicular lesions ? ?MS: moves all visible extremities  without noticeable abnormality ? ?PSYCH/NEURO: pleasant and cooperative, no obvious depression or anxiety, speech and thought processing grossly intact ? ?ASSESSMENT AND PLAN: ? ?Discussed the following assessment and plan: ? ?Rash ? ?-we discussed possible serious and likely etiologies, options for evaluation and workup, limitations of telemedicine visit vs in person visit, treatment, treatment risks and precautions. Pt is agreeable to treatment via telemedicine at this moment. 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 He has opted for prednisone taper.  ?Advised to seek prompt in person care if worsening, new symptoms arise, or if is not improving with treatment as expected per our conversation of expected course.  ?  ?I discussed the assessment and treatment plan with the patient. The patient was provided an opportunity to ask questions and all were answered. The patient agreed with the plan and demonstrated an understanding of the instructions. ?  ? ? ?Lucretia Kern, DO  ? ?

## 2021-05-26 ENCOUNTER — Other Ambulatory Visit: Payer: Self-pay | Admitting: Internal Medicine

## 2021-05-26 DIAGNOSIS — K648 Other hemorrhoids: Secondary | ICD-10-CM

## 2021-07-21 ENCOUNTER — Other Ambulatory Visit: Payer: Self-pay | Admitting: Internal Medicine

## 2021-07-21 DIAGNOSIS — K648 Other hemorrhoids: Secondary | ICD-10-CM

## 2021-08-04 NOTE — Progress Notes (Signed)
HPI: FU HOCM. A previous Myoview in July 2008 showed no ischemia or infarction. Abd ultrasound 3/18 showed no aneurysm. Exercise treadmill September 2018 showed no decrease in systolic blood pressure with exercise. Monitor November 2020 showed sinus bradycardia, normal sinus rhythm, PACs and rare PVC but no nonsustained ventricular tachycardia. Echocardiogram October 2022 showed normal LV function, severe asymmetric septal hypertrophy, grade 1 diastolic dysfunction, systolic anterior motion of the mitral valve, mild to moderate left atrial enlargement; findings felt consistent with hypertrophic cardiomyopathy.  Since last seen, the patient has dyspnea with more extreme activities but not with routine activities. It is relieved with rest. It is not associated with chest pain. There is no orthopnea, PND or pedal edema. There is no syncope or palpitations. There is no exertional chest pain.   Current Outpatient Medications  Medication Sig Dispense Refill   ANUCORT-HC 25 MG suppository UNWRAP AND INSERT ONE SUPPOSITORY RECTALLY TWICE DAILY 12 suppository 0   azelastine (ASTELIN) 0.1 % nasal spray Place 1 spray into both nostrils 2 (two) times daily. Use in each nostril as directed     fluticasone (FLONASE) 50 MCG/ACT nasal spray SPRAY 2 SPRAYS INTO EACH NOSTRIL EVERY DAY 48 mL 2   levocetirizine (XYZAL) 5 MG tablet 1 tablet in the evening 90 tablet 1   metoprolol succinate (TOPROL-XL) 25 MG 24 hr tablet TAKE 1 TABLET BY MOUTH EVERY DAY 90 tablet 3   montelukast (SINGULAIR) 10 MG tablet Take 10 mg by mouth daily.     Multiple Vitamin (MULTIVITAMIN) tablet Take 1 tablet by mouth daily.     OVER THE COUNTER MEDICATION Neuriva     RESVERATROL PO Take 325 each by mouth.     No current facility-administered medications for this visit.     Past Medical History:  Diagnosis Date   Allergy    Colon polyp    Mucosal prolapse polyp   Diverticulosis    Essential hypertension 09/22/2008   Qualifier:  Diagnosis of  By: Burnett Kanaris     External hemorrhoids    HOCM (hypertrophic obstructive cardiomyopathy) ([facility])    Hypertension    Hypertrophic obstructive cardiomyopathy ([place]) 09/24/2008   Qualifier: Diagnosis of  By: Stanford Breed, MD, Kandyce Rud    Irregular heart beat    VASOMOTOR RHINITIS 12/24/2008   Qualifier: Diagnosis of  By: Burnice Logan  MD, Doretha Sou    VENTRICULAR HYPERTROPHY, LEFT 09/22/2008   Qualifier: Diagnosis of  By: Burnett Kanaris      Past Surgical History:  Procedure Laterality Date   COLONOSCOPY     TONSILLECTOMY      Social History   Socioeconomic History   Marital status: Married    Spouse name: Not on file   Number of children: 0   Years of education: Not on file   Highest education level: Bachelor's degree (e.g., BA, AB, BS)  Occupational History   Occupation: Facilities manager  Tobacco Use   Smoking status: Former    Types: Cigarettes    Quit date: 05/06/1984    Years since quitting: 37.2   Smokeless tobacco: Never  Vaping Use   Vaping Use: Never used  Substance and Sexual Activity   Alcohol use: Not Currently    Alcohol/week: 0.0 standard drinks of alcohol   Drug use: No   Sexual activity: Not Currently  Other Topics Concern   Not on file  Social History Narrative   Not on file   Social Determinants of Health   Financial  Resource Strain: Low Risk  (12/26/2020)   Overall Financial Resource Strain (CARDIA)    Difficulty of Paying Living Expenses: Not hard at all  Food Insecurity: No Food Insecurity (12/26/2020)   Hunger Vital Sign    Worried About Running Out of Food in the Last Year: Never true    Ran Out of Food in the Last Year: Never true  Transportation Needs: No Transportation Needs (12/26/2020)   PRAPARE - Hydrologist (Medical): No    Lack of Transportation (Non-Medical): No  Physical Activity: Insufficiently Active (12/26/2020)   Exercise Vital Sign    Days of Exercise per Week: 3  days    Minutes of Exercise per Session: 30 min  Stress: No Stress Concern Present (12/26/2020)   [place]    Feeling of Stress : Only a little  Social Connections: Unknown (12/26/2020)   Social Connection and Isolation Panel [NHANES]    Frequency of Communication with Friends and Family: Patient refused    Frequency of Social Gatherings with Friends and Family: Patient refused    Attends Religious Services: Patient refused    Marine scientist or Organizations: Patient refused    Attends Music therapist: Not on file    Marital Status: Married  Human resources officer Violence: Not on file    Family History  Problem Relation Age of Onset   Pancreatic cancer Father    Colon polyps Father        Benign   Colon cancer Neg Hx    Diabetes Neg Hx    Kidney disease Neg Hx    Gallbladder disease Neg Hx    Esophageal cancer Neg Hx    Rectal cancer Neg Hx    Stomach cancer Neg Hx     ROS: no fevers or chills, productive cough, hemoptysis, dysphasia, odynophagia, melena, hematochezia, dysuria, hematuria, rash, seizure activity, orthopnea, PND, pedal edema, claudication. Remaining systems are negative.  Physical Exam: Well-developed well-nourished in no acute distress.  Skin is warm and dry.  HEENT is normal.  Neck is supple.  Chest is clear to auscultation with normal expansion.  Cardiovascular exam is regular rate and rhythm.  2/6 systolic murmur left sternal border that increases with Valsalva. Abdominal exam nontender or distended. No masses palpated. Extremities show no edema. neuro grossly intact  ECG-sinus bradycardia at a rate of 57, left ventricular hypertrophy with repolarization abnormality.  Personally reviewed  A/P  1 hypertrophic obstructive cardiomyopathy-continue beta-blocker at present dose.  He continues to have some dyspnea on exertion but symptoms are reasonable.  As outlined in  previous notes given no documented risk factors for sudden cardiac death and patient's age we have not felt he required ICD.  However we will plan to repeat exercise treadmill to look for systolic blood pressure response with activities.  I will also arrange a monitor to rule out nonsustained VT.  Finally we will arrange a cardiac MRI.  2 hypertension-patient's blood pressure is controlled today.  Continue present medical regimen.  3 family history of prothrombin gene mutation-we will order a hypercoagulable panel.  If abnormal we will refer to hematology oncology.  Kirk Ruths, MD

## 2021-08-14 ENCOUNTER — Encounter: Payer: Self-pay | Admitting: Cardiology

## 2021-08-14 ENCOUNTER — Ambulatory Visit (INDEPENDENT_AMBULATORY_CARE_PROVIDER_SITE_OTHER): Payer: BC Managed Care – PPO

## 2021-08-14 ENCOUNTER — Other Ambulatory Visit: Payer: Self-pay | Admitting: Cardiology

## 2021-08-14 ENCOUNTER — Ambulatory Visit: Payer: BC Managed Care – PPO | Admitting: Cardiology

## 2021-08-14 VITALS — BP 134/82 | HR 57 | Ht 70.0 in | Wt 200.0 lb

## 2021-08-14 DIAGNOSIS — I421 Obstructive hypertrophic cardiomyopathy: Secondary | ICD-10-CM

## 2021-08-14 DIAGNOSIS — R0609 Other forms of dyspnea: Secondary | ICD-10-CM

## 2021-08-14 DIAGNOSIS — R001 Bradycardia, unspecified: Secondary | ICD-10-CM

## 2021-08-14 DIAGNOSIS — I10 Essential (primary) hypertension: Secondary | ICD-10-CM

## 2021-08-14 DIAGNOSIS — I493 Ventricular premature depolarization: Secondary | ICD-10-CM

## 2021-08-14 NOTE — Patient Instructions (Signed)
Testing/Procedures:  Your physician has requested that you have an exercise tolerance test. For further information please visit [email or web address]. Please also follow instruction sheet, as given. [facility]    You are scheduled for Cardiac MRI on ______________. Please arrive for your appointment at ______________ ( arrive 30-45 minutes prior to test start time). ?  [facility] [address] [place], [place] [address] [phone] Please take advantage of the free valet parking available at the MAIN entrance (A entrance).  Proceed to the [facility] Radiology Department (First Floor) for check-in.    Magnetic resonance imaging (MRI) is a painless test that produces images of the inside of the body without using Xrays.  During an MRI, strong magnets and radio waves work together in a Research officer, political party to form detailed images.   MRI images may provide more details about a medical condition than X-rays, CT scans, and ultrasounds can provide.  You may be given earphones to listen for instructions.  You may eat a light breakfast and take medications as ordered Please avoid stimulants for 12 hr prior to test. (Ie. Caffeine, nicotine, chocolate, or antihistamine medications)  If a contrast material will be used, an IV will be inserted into one of your veins. Contrast material will be injected into your IV. It will leave your body through your urine within a day. You may be told to drink plenty of fluids to help flush the contrast material out of your system.  You will be asked to remove all metal, including: Watch, jewelry, and other metal objects including hearing aids, hair pieces and dentures. Also wearable glucose monitoring systems (ie. Freestyle Libre and Omnipods) (Braces and fillings normally are not a problem.)   TEST WILL TAKE APPROXIMATELY 1 HOUR  PLEASE NOTIFY SCHEDULING AT LEAST 24 HOURS IN ADVANCE IF YOU ARE UNABLE TO KEEP YOUR  APPOINTMENT. [phone]  Please call Marchia Bond, cardiac imaging nurse navigator with any questions/concerns. Marchia Bond RN Navigator Cardiac Imaging Gordy Clement RN Navigator Cardiac Imaging [facility] [phone] Office     ZIO XT- Long Term Monitor Instructions  Your physician has requested you wear a ZIO patch monitor for 3 days.  This is a single patch monitor. [employer] supplies one patch monitor per enrollment. Additional stickers are not available. Please do not apply patch if you will be having a Nuclear Stress Test,  Echocardiogram, Cardiac CT, MRI, or Chest Xray during the period you would be wearing the  monitor. The patch cannot be worn during these tests. You cannot remove and re-apply the  ZIO XT patch monitor.  Your ZIO patch monitor will be mailed 3 day USPS to your address on file. It may take 3-5 days  to receive your monitor after you have been enrolled.  Once you have received your monitor, please review the enclosed instructions. Your monitor  has already been registered assigning a specific monitor serial # to you.  Billing and Patient Assistance Program Information  We have supplied [employer] with any of your insurance information on file for billing purposes. [employer] offers a sliding scale Patient Assistance Program for patients that do not have  insurance, or whose insurance does not completely cover the cost of the ZIO monitor.  You must apply for the Patient Assistance Program to qualify for this discounted rate.  To apply, please call [employer] at [phone], select option 4, select option 2, ask to apply for  Patient Assistance Program. Theodore Demark will ask your  household income, and how many people  are in your household. They will quote your out-of-pocket cost based on that information.  [employer] will also be able to set up a 34-month interest-free payment plan if needed.  Applying the monitor   Shave hair from upper  left chest.  Hold abrader disc by orange tab. Rub abrader in 40 strokes over the upper left chest as  indicated in your monitor instructions.  Clean area with 4 enclosed alcohol pads. Let dry.  Apply patch as indicated in monitor instructions. Patch will be placed under collarbone on left  side of chest with arrow pointing upward.  Rub patch adhesive wings for 2 minutes. Remove white label marked "1". Remove the white  label marked "2". Rub patch adhesive wings for 2 additional minutes.  While looking in a mirror, press and release button in center of patch. A small green light will  flash 3-4 times. This will be your only indicator that the monitor has been turned on.  Do not shower for the first 24 hours. You may shower after the first 24 hours.  Press the button if you feel a symptom. You will hear a small click. Record Date, Time and  Symptom in the Patient Logbook.  When you are ready to remove the patch, follow instructions on the last 2 pages of Patient  Logbook. Stick patch monitor onto the last page of Patient Logbook.  Place Patient Logbook in the blue and white box. Use locking tab on box and tape box closed  securely. The blue and white box has prepaid postage on it. Please place it in the mailbox as  soon as possible. Your physician should have your test results approximately 7 days after the  monitor has been [facility]  Call ITerrellat [phone] you have questions regarding  your ZIO XT patch monitor. Call them immediately if you see an orange light blinking on your  monitor.  If your monitor falls off in less than 4 days, contact our Monitor department at [phone]  If your monitor becomes loose or falls off after 4 days call [employer] at [phone]  suggestions on securing your monitor    Follow-Up: [facility] you and your health needs are our priority.  As part of our continuing mission to provide you with  exceptional heart care, we have created designated Provider Care Teams.  These Care Teams include your primary Cardiologist (physician) and Advanced Practice Providers (APPs -  Physician Assistants and Nurse Practitioners) who all work together to provide you with the care you need, when you need it.  We recommend signing up for the patient portal called "MyChart".  Sign up information is provided on this After Visit Summary.  MyChart is used to connect with patients for Virtual Visits (Telemedicine).  Patients are able to view lab/test results, encounter notes, upcoming appointments, etc.  Non-urgent messages can be sent to your provider as well.   To learn more about what you can do with MyChart, go to [email or web address]    Your next appointment:   12 month(s)  The format for your next appointment:   In Person  Provider:   BKirk Ruths MD

## 2021-08-14 NOTE — Progress Notes (Unsigned)
Enrolled for [employer] to mail a ZIO XT long term holter monitor to the patients address on file.  

## 2021-08-15 ENCOUNTER — Telehealth (HOSPITAL_COMMUNITY): Payer: Self-pay | Admitting: *Deleted

## 2021-08-15 NOTE — Telephone Encounter (Signed)
Close encounter 

## 2021-08-18 DIAGNOSIS — I493 Ventricular premature depolarization: Secondary | ICD-10-CM | POA: Diagnosis not present

## 2021-08-18 DIAGNOSIS — R001 Bradycardia, unspecified: Secondary | ICD-10-CM

## 2021-08-18 DIAGNOSIS — I421 Obstructive hypertrophic cardiomyopathy: Secondary | ICD-10-CM

## 2021-08-18 DIAGNOSIS — I10 Essential (primary) hypertension: Secondary | ICD-10-CM | POA: Diagnosis not present

## 2021-08-18 DIAGNOSIS — R0609 Other forms of dyspnea: Secondary | ICD-10-CM

## 2021-08-21 ENCOUNTER — Ambulatory Visit (HOSPITAL_COMMUNITY)
Admission: RE | Admit: 2021-08-21 | Payer: BC Managed Care – PPO | Source: Ambulatory Visit | Attending: Cardiology | Admitting: Cardiology

## 2021-08-22 ENCOUNTER — Ambulatory Visit: Payer: BC Managed Care – PPO | Admitting: Adult Health

## 2021-08-22 ENCOUNTER — Encounter: Payer: Self-pay | Admitting: Adult Health

## 2021-08-22 VITALS — BP 110/80 | HR 67 | Temp 98.0°F | Ht 70.0 in | Wt 161.0 lb

## 2021-08-22 DIAGNOSIS — J0101 Acute recurrent maxillary sinusitis: Secondary | ICD-10-CM

## 2021-08-22 MED ORDER — DOXYCYCLINE HYCLATE 100 MG PO CAPS: 100.0000 mg | ORAL_CAPSULE | Freq: Two times a day (BID) | ORAL | 0 refills | Status: DC

## 2021-08-22 NOTE — Progress Notes (Signed)
Subjective:    Patient ID: George [name], male    DOB: 09-09-1950, 71 y.o.   MRN: [id number]  HPI  71 year old male who  has a past medical history of Allergy, Colon polyp, Diverticulosis, Essential hypertension (09/22/2008), External hemorrhoids, HOCM (hypertrophic obstructive cardiomyopathy) ([place]), Hypertension, Hypertrophic obstructive cardiomyopathy ([place]) (09/24/2008), Irregular heart beat, VASOMOTOR RHINITIS (12/24/2008), and VENTRICULAR HYPERTROPHY, LEFT (09/22/2008).  He presents to the office today for an acute issue   His symptoms have been present for the last 8-9 days. Symptoms include sinus pain and pressure, sinus congestion, bilateral ear pain, PND, fatigue, and nausea  He denies fevers, chills, or productive cough  He has been using Singulair and nasal sprays with some improvement   Review of Systems See HPI   Past Medical History:  Diagnosis Date   Allergy    Colon polyp    Mucosal prolapse polyp   Diverticulosis    Essential hypertension 09/22/2008   Qualifier: Diagnosis of  By: Burnett Kanaris     External hemorrhoids    HOCM (hypertrophic obstructive cardiomyopathy) ([place])    Hypertension    Hypertrophic obstructive cardiomyopathy ([place]) 09/24/2008   Qualifier: Diagnosis of  By: Stanford Breed, MD, Kandyce Rud    Irregular heart beat    VASOMOTOR RHINITIS 12/24/2008   Qualifier: Diagnosis of  By: Burnice Logan  MD, Doretha Sou    VENTRICULAR HYPERTROPHY, LEFT 09/22/2008   Qualifier: Diagnosis of  By: Burnett Kanaris      Social History   Socioeconomic History   Marital status: Married    Spouse name: Not on file   Number of children: 0   Years of education: Not on file   Highest education level: Bachelor's degree (e.g., BA, AB, BS)  Occupational History   Occupation: Facilities manager  Tobacco Use   Smoking status: Former    Types: Cigarettes    Quit date: 05/06/1984    Years since quitting: 37.3   Smokeless tobacco: Never  Vaping Use   Vaping Use:  Never used  Substance and Sexual Activity   Alcohol use: Not Currently    Alcohol/week: 0.0 standard drinks of alcohol   Drug use: No   Sexual activity: Not Currently  Other Topics Concern   Not on file  Social History Narrative   Not on file   Social Determinants of Health   Financial Resource Strain: Low Risk  (12/26/2020)   Overall Financial Resource Strain (CARDIA)    Difficulty of Paying Living Expenses: Not hard at all  Food Insecurity: No Food Insecurity (12/26/2020)   Hunger Vital Sign    Worried About Running Out of Food in the Last Year: Never true    Ran Out of Food in the Last Year: Never true  Transportation Needs: No Transportation Needs (12/26/2020)   PRAPARE - Hydrologist (Medical): No    Lack of Transportation (Non-Medical): No  Physical Activity: Insufficiently Active (12/26/2020)   Exercise Vital Sign    Days of Exercise per Week: 3 days    Minutes of Exercise per Session: 30 min  Stress: No Stress Concern Present (12/26/2020)   [place]    Feeling of Stress : Only a little  Social Connections: Unknown (12/26/2020)   Social Connection and Isolation Panel [NHANES]    Frequency of Communication with Friends and Family: Patient refused    Frequency of Social Gatherings with Friends and Family: Patient refused  Attends Religious Services: Patient refused    Active Member of Clubs or Organizations: Patient refused    Attends Archivist Meetings: Not on file    Marital Status: Married  Human resources officer Violence: Not on file    Past Surgical History:  Procedure Laterality Date   COLONOSCOPY     TONSILLECTOMY      Family History  Problem Relation Age of Onset   Pancreatic cancer Father    Colon polyps Father        Benign   Colon cancer Neg Hx    Diabetes Neg Hx    Kidney disease Neg Hx    Gallbladder disease Neg Hx    Esophageal cancer Neg  Hx    Rectal cancer Neg Hx    Stomach cancer Neg Hx     Allergies  Allergen Reactions   Sulfonamide Derivatives     Current Outpatient Medications on File Prior to Visit  Medication Sig Dispense Refill   ANUCORT-HC 25 MG suppository UNWRAP AND INSERT ONE SUPPOSITORY RECTALLY TWICE DAILY 12 suppository 0   azelastine (ASTELIN) 0.1 % nasal spray Place 1 spray into both nostrils 2 (two) times daily. Use in each nostril as directed     fluticasone (FLONASE) 50 MCG/ACT nasal spray SPRAY 2 SPRAYS INTO EACH NOSTRIL EVERY DAY 48 mL 2   levocetirizine (XYZAL) 5 MG tablet 1 tablet in the evening 90 tablet 1   metoprolol succinate (TOPROL-XL) 25 MG 24 hr tablet TAKE 1 TABLET BY MOUTH EVERY DAY 90 tablet 3   montelukast (SINGULAIR) 10 MG tablet Take 10 mg by mouth daily.     Multiple Vitamin (MULTIVITAMIN) tablet Take 1 tablet by mouth daily.     OVER THE COUNTER MEDICATION Neuriva     RESVERATROL PO Take 325 each by mouth.     No current facility-administered medications on file prior to visit.    BP 110/80   Pulse 67   Temp 98 F (36.7 C) (Oral)   Ht '5\' 10"'$  (1.778 m)   Wt 161 lb (73 kg)   SpO2 95%   BMI 23.10 kg/m       Objective:   Physical Exam Vitals and nursing note reviewed.  Constitutional:      Appearance: Normal appearance.  HENT:     Nose: Congestion present. No rhinorrhea.     Right Turbinates: Enlarged and swollen.     Left Turbinates: Enlarged and swollen.     Right Sinus: Maxillary sinus tenderness present.     Left Sinus: Maxillary sinus tenderness present.     Mouth/Throat:     Pharynx: Oropharynx is clear. No oropharyngeal exudate or posterior oropharyngeal erythema.  Cardiovascular:     Rate and Rhythm: Normal rate and regular rhythm.     Pulses: Normal pulses.     Heart sounds: Murmur heard.  Pulmonary:     Effort: Pulmonary effort is normal.     Breath sounds: Normal breath sounds.  Neurological:     Mental Status: He is alert.        Assessment  & Plan:  1. Acute recurrent maxillary sinusitis  - doxycycline (VIBRAMYCIN) 100 MG capsule; Take 1 capsule (100 mg total) by mouth 2 (two) times daily.  Dispense: 14 capsule; Refill: 0 - Follow up if no improvement in the next 2-3 days   Dorothyann Peng, NP

## 2021-08-27 LAB — STATUS REPORT

## 2021-08-28 ENCOUNTER — Ambulatory Visit (HOSPITAL_COMMUNITY): Payer: BC Managed Care – PPO

## 2021-08-29 ENCOUNTER — Other Ambulatory Visit: Payer: Self-pay | Admitting: Internal Medicine

## 2021-08-29 ENCOUNTER — Other Ambulatory Visit: Payer: Self-pay

## 2021-08-29 DIAGNOSIS — D6852 Prothrombin gene mutation: Secondary | ICD-10-CM

## 2021-08-29 DIAGNOSIS — K648 Other hemorrhoids: Secondary | ICD-10-CM

## 2021-08-29 LAB — HYPERCOAGULABLE PANEL, COMPREHENSIVE
APTT: 29 s
AT III Act/Nor PPP Chro: 108 %
Act. Prt C Resist w/FV Defic.: 2.5 ratio
Anticardiolipin Ab, IgG: <10 [GPL'U]
Anticardiolipin Ab, IgM: <10 [MPL'U]
Beta-2 Glycoprotein I, IgA: <10 SAU
Beta-2 Glycoprotein I, IgG: <10 SGU
Beta-2 Glycoprotein I, IgM: <10 SMU
DRVVT Screen Seconds: 39.6 s
Factor VII Antigen**: 114 %
Factor VIII Activity: 70 %
Hexagonal Phospholipid Neutral: 9 s
Homocysteine: 13.1 umol/L
Prot C Ag Act/Nor PPP Imm: 108 %
Prot S Ag Act/Nor PPP Imm: 74 %
Protein C Ag/FVII Ag Ratio**: 0.9 ratio
Protein S Ag/FVII Ag Ratio**: 0.6 ratio

## 2021-09-02 ENCOUNTER — Telehealth: Payer: Self-pay | Admitting: Physician Assistant

## 2021-09-02 ENCOUNTER — Telehealth (HOSPITAL_COMMUNITY): Payer: Self-pay | Admitting: *Deleted

## 2021-09-02 NOTE — Telephone Encounter (Signed)
Close encounter 

## 2021-09-02 NOTE — Telephone Encounter (Signed)
Scheduled appt per 8/18 referral. Pt is aware of appt date and time. Pt is aware to arrive 15 mins prior to appt time and to bring and updated insurance card. Pt is aware of appt location.

## 2021-09-03 ENCOUNTER — Ambulatory Visit (HOSPITAL_COMMUNITY)
Admission: RE | Admit: 2021-09-03 | Discharge: 2021-09-03 | Disposition: A | Discharge status: Home / Self Care | Payer: BC Managed Care – PPO | Source: Ambulatory Visit | Attending: Cardiovascular Disease | Admitting: Cardiovascular Disease

## 2021-09-03 DIAGNOSIS — I421 Obstructive hypertrophic cardiomyopathy: Secondary | ICD-10-CM | POA: Diagnosis not present

## 2021-09-03 DIAGNOSIS — I10 Essential (primary) hypertension: Secondary | ICD-10-CM | POA: Diagnosis present

## 2021-09-03 DIAGNOSIS — R0609 Other forms of dyspnea: Secondary | ICD-10-CM | POA: Diagnosis present

## 2021-09-03 DIAGNOSIS — I493 Ventricular premature depolarization: Secondary | ICD-10-CM | POA: Insufficient documentation

## 2021-09-03 DIAGNOSIS — R001 Bradycardia, unspecified: Secondary | ICD-10-CM | POA: Diagnosis present

## 2021-09-03 LAB — EXERCISE TOLERANCE TEST
Angina Index: 0
Duke Treadmill Score: 9
Estimated workload: 10.1
Exercise duration (min): 8 min
Exercise duration (sec): 34 s
MPHR: 151 {beats}/min
Peak HR: 120 {beats}/min
Percent HR: 79 %
Rest HR: 53 {beats}/min
ST Depression (mm): 0 mm

## 2021-09-29 NOTE — Progress Notes (Signed)
[facility] Telephone:([phone]) [phone]   Fax:([phone]) [place] NOTE  Patient Care Team: Isaac Bliss, Rayford Halsted, MD as PCP - General (Internal Medicine) Stanford Breed Denice Bors, MD as PCP - Cardiology (Cardiology) Stanford Breed Denice Bors, MD (Cardiology)   CHIEF COMPLAINTS/PURPOSE OF CONSULTATION:  Prothombin gene mutation  HISTORY OF PRESENTING ILLNESS:  George [name] 71 y.o. male with medical history significant for hypertrophic obstructive cardiomyopathy, hypertension, borderline hyperlipidemia.  He presents to the hematology clinic for recently diagnosed thrombin gene mutation.  He is unaccompanied for this visit.  On review of the previous records, Mr. Leichter underwent hypercoagulable work-up on 08/14/2021 due to family history of prothrombin gene mutation.  Findings indicated heterozygous mutation for prothrombin gene mutation.  On exam today, Mr.Nicolaisen reports his energy levels are stable.  He tries to exercise daily and completes all his daily activities on his own.  He has a good appetite and denies any significant weight loss.  He denies any GI symptoms including nausea, vomiting or abdominal pain.  His bowel habits are unchanged without any recurrent episodes of diarrhea or constipation.  He denies easy bruising or signs of active bleeding.  Patient denies no prior history of blood clots including DVT or pulmonary embolism.  He denies fevers, chills, night sweats, shortness of breath, chest pain or cough.  Rest of the 10 point ROS is below.  MEDICAL HISTORY:  Past Medical History:  Diagnosis Date   Allergy    Colon polyp    Mucosal prolapse polyp   Diverticulosis    Essential hypertension 09/22/2008   Qualifier: Diagnosis of  By: Burnett Kanaris     External hemorrhoids    HOCM (hypertrophic obstructive cardiomyopathy) ([facility])    Hypertension    Hypertrophic obstructive cardiomyopathy ([place]) 09/24/2008   Qualifier: Diagnosis of  By: Stanford Breed, MD, Kandyce Rud    Irregular heart beat    VASOMOTOR RHINITIS 12/24/2008   Qualifier: Diagnosis of  By: Burnice Logan  MD, Doretha Sou    VENTRICULAR HYPERTROPHY, LEFT 09/22/2008   Qualifier: Diagnosis of  By: Burnett Kanaris      SURGICAL HISTORY: Past Surgical History:  Procedure Laterality Date   COLONOSCOPY     TONSILLECTOMY      SOCIAL HISTORY: Social History   Socioeconomic History   Marital status: Married    Spouse name: Not on file   Number of children: 0   Years of education: Not on file   Highest education level: Bachelor's degree (e.g., BA, AB, BS)  Occupational History   Occupation: Facilities manager  Tobacco Use   Smoking status: Former    Types: Cigarettes    Quit date: 05/06/1984    Years since quitting: 37.4   Smokeless tobacco: Never  Vaping Use   Vaping Use: Never used  Substance and Sexual Activity   Alcohol use: Not Currently    Alcohol/week: 0.0 standard drinks of alcohol   Drug use: No   Sexual activity: Not Currently  Other Topics Concern   Not on file  Social History Narrative   Not on file   Social Determinants of Health   Financial Resource Strain: Low Risk  (12/26/2020)   Overall Financial Resource Strain (CARDIA)    Difficulty of Paying Living Expenses: Not hard at all  Food Insecurity: No Food Insecurity (12/26/2020)   Hunger Vital Sign    Worried About Running Out of Food in the Last Year: Never true    Ran Out of Food in the  Last Year: Never true  Transportation Needs: No Transportation Needs (12/26/2020)   PRAPARE - Hydrologist (Medical): No    Lack of Transportation (Non-Medical): No  Physical Activity: Insufficiently Active (12/26/2020)   Exercise Vital Sign    Days of Exercise per Week: 3 days    Minutes of Exercise per Session: 30 min  Stress: No Stress Concern Present (12/26/2020)   [place]    Feeling of Stress : Only a little   Social Connections: Unknown (12/26/2020)   Social Connection and Isolation Panel [NHANES]    Frequency of Communication with Friends and Family: Patient refused    Frequency of Social Gatherings with Friends and Family: Patient refused    Attends Religious Services: Patient refused    Printmaker: Patient refused    Attends Music therapist: Not on file    Marital Status: Married  Human resources officer Violence: Not on file    FAMILY HISTORY: Family History  Problem Relation Age of Onset   Pancreatic cancer Father    Colon polyps Father        Benign   Colon cancer Neg Hx    Diabetes Neg Hx    Kidney disease Neg Hx    Gallbladder disease Neg Hx    Esophageal cancer Neg Hx    Rectal cancer Neg Hx    Stomach cancer Neg Hx     ALLERGIES:  is allergic to sulfonamide derivatives.  MEDICATIONS:  Current Outpatient Medications  Medication Sig Dispense Refill   ANUCORT-HC 25 MG suppository UNWRAP AND INSERT ONE SUPPOSITORY RECTALLY TWICE DAILY 12 suppository 0   azelastine (ASTELIN) 0.1 % nasal spray Place 1 spray into both nostrils 2 (two) times daily. Use in each nostril as directed     doxycycline (VIBRAMYCIN) 100 MG capsule Take 1 capsule (100 mg total) by mouth 2 (two) times daily. 14 capsule 0   fluticasone (FLONASE) 50 MCG/ACT nasal spray SPRAY 2 SPRAYS INTO EACH NOSTRIL EVERY DAY 48 mL 2   levocetirizine (XYZAL) 5 MG tablet 1 tablet in the evening 90 tablet 1   metoprolol succinate (TOPROL-XL) 25 MG 24 hr tablet TAKE 1 TABLET BY MOUTH EVERY DAY 90 tablet 3   montelukast (SINGULAIR) 10 MG tablet Take 10 mg by mouth daily.     Multiple Vitamin (MULTIVITAMIN) tablet Take 1 tablet by mouth daily.     OVER THE COUNTER MEDICATION Neuriva     RESVERATROL PO Take 325 each by mouth.     No current facility-administered medications for this visit.    REVIEW OF SYSTEMS:   Constitutional: ( - ) fevers, ( - )  chills , ( - ) night sweats Eyes: (  - ) blurriness of vision, ( - ) double vision, ( - ) watery eyes Ears, nose, mouth, throat, and face: ( - ) mucositis, ( - ) sore throat Respiratory: ( - ) cough, ( - ) dyspnea, ( - ) wheezes Cardiovascular: ( - ) palpitation, ( - ) chest discomfort, ( - ) lower extremity swelling Gastrointestinal:  ( - ) nausea, ( - ) heartburn, ( - ) change in bowel habits Skin: ( - ) abnormal skin rashes Lymphatics: ( - ) new lymphadenopathy, ( - ) easy bruising Neurological: ( - ) numbness, ( - ) tingling, ( - ) new weaknesses Behavioral/Psych: ( - ) mood change, ( - ) new changes  All other  systems were reviewed with the patient and are negative.  PHYSICAL EXAMINATION: ECOG PERFORMANCE STATUS: 0 - Asymptomatic  There were no vitals filed for this visit. There were no vitals filed for this visit.  GENERAL: well appearing male in NAD  SKIN: skin color, texture, turgor are normal, no rashes or significant lesions EYES: conjunctiva are pink and non-injected, sclera clear OROPHARYNX: no exudate, no erythema; lips, buccal mucosa, and tongue normal  LUNGS: clear to auscultation and percussion with normal breathing effort HEART: regular rate & rhythm and no murmurs and no lower extremity edema Musculoskeletal: no cyanosis of digits and no clubbing  PSYCH: alert & oriented x 3, fluent speech NEURO: no focal motor/sensory deficits  LABORATORY DATA:  I have reviewed the data as listed    Latest Ref Rng & Units 03/18/2021    9:28 AM 02/14/2020   10:27 AM 09/13/2018   10:01 AM  CBC  WBC 4.0 - 10.5 K/uL 6.2  6.3  6.6   Hemoglobin 13.0 - 17.0 g/dL 13.8  15.0  13.8   Hematocrit 39.0 - 52.0 % 39.5  43.3  40.4   Platelets 150.0 - 400.0 K/uL 166.0  185.0  198.0        Latest Ref Rng & Units 03/18/2021    9:28 AM 02/14/2020   10:27 AM 09/13/2018   10:01 AM  CMP  Glucose 70 - 99 mg/dL 102  99  103   BUN 6 - 23 mg/dL '20  20  13   '$ Creatinine 0.40 - 1.50 mg/dL 0.99  1.04  0.88   Sodium 135 - 145 mEq/L 138  138   138   Potassium 3.5 - 5.1 mEq/L 4.6  4.4  4.5   Chloride 96 - 112 mEq/L 101  101  99   CO2 19 - 32 mEq/L 30  33  31   Calcium 8.4 - 10.5 mg/dL 9.7  10.1  9.6   Total Protein 6.0 - 8.3 g/dL 6.5  7.1  6.5   Total Bilirubin 0.2 - 1.2 mg/dL 0.7  0.6  0.6   Alkaline Phos 39 - 117 U/L 38  44  41   AST 0 - 37 U/L '20  17  14   '$ ALT 0 - 53 U/L '13  14  10     '$ ASSESSMENT & PLAN ISAAK DELMUNDO is a 71 y.o. male presents to the hematology clinic for heterozygous mutation of the prothrombin gene.  We discussed that with this mutation, he is at a higher risk for developing a blood clot compared to the general population.  He denies any prior venous thromboembolisms (VTE) including DVTs or pulmonary embolism.  Without prior history of VTE's, we do not recommend anticoagulation for prophylaxis. I reviewed precautions to minimize the risk of VTEs. This includes avoiding immobility during travel, consideration for perioperative anticoagulation and avoid smoking.   #Heterozygous for prothrombin [id number] gene mutation: --Patient has no personal history VTE, so no indication for anticoagulation --Provided precautions to minimize risk of VTEs including avoid immobility during travel, perioperative anticoagulation and avoid smoking.  --No further workup is necessary --Return to clinic as needed.    No orders of the defined types were placed in this encounter.   All questions were answered. The patient knows to call the clinic with any problems, questions or concerns.  I have spent a total of 45 minutes minutes of face-to-face and non-face-to-face time, preparing to see the patient, obtaining and/or reviewing separately obtained history, performing a medically  appropriate examination, counseling and educating the patient, documenting clinical information in the electronic health record, and care coordination.   Dede Query, PA-C Department of Hematology/Oncology [place] at [facility] Phone: [phone]  Patient was seen with Dr. Lorenso Courier.   I have read the above note and personally examined the patient. I agree with the assessment and plan as noted above.  Briefly Mr. Baca is a 71 year old male who presents for evaluation of a newly diagnosed heterozygous prothrombin gene mutation.  The patient's brother recently had a DVT and was noted to have a prothrombin gene mutation.  Today we discussed the nature of the heterozygous prothrombin gene mutation in the setting of no prior VTE.  Mr. Witt has never had a DVT or pulmonary embolism additionally has never had any issues with superficial thrombophlebitis.  As such there are no routine recommendations for DVT prevention.  Today however we did discuss VTE prevention for prolonged travel or after surgery.  Recommend that he consider baby aspirin and compression socks for prolonged travel as well as frequent breaks to stretch his legs, at least every 2 hours.  Additionally if he were to undergo surgery would recommend reaching out to our office so we can make recommendations regarding DVT prophylaxis.  The patient voices understanding of the plan moving forward.   Ledell Peoples, MD Department of Hematology/Oncology [place] at [facility] Phone: [phone] Pager: [phone] Email: [email or web address]

## 2021-09-30 ENCOUNTER — Inpatient Hospital Stay: Payer: BC Managed Care – PPO

## 2021-09-30 ENCOUNTER — Inpatient Hospital Stay: Payer: BC Managed Care – PPO | Attending: Physician Assistant | Admitting: Physician Assistant

## 2021-09-30 ENCOUNTER — Other Ambulatory Visit: Payer: Self-pay

## 2021-09-30 VITALS — BP 164/97 | HR 56 | Temp 97.7°F | Resp 15 | Ht 70.0 in | Wt 208.9 lb

## 2021-09-30 DIAGNOSIS — Z832 Family history of diseases of the blood and blood-forming organs and certain disorders involving the immune mechanism: Secondary | ICD-10-CM | POA: Insufficient documentation

## 2021-09-30 DIAGNOSIS — I10 Essential (primary) hypertension: Secondary | ICD-10-CM | POA: Diagnosis not present

## 2021-09-30 DIAGNOSIS — D6852 Prothrombin gene mutation: Secondary | ICD-10-CM | POA: Insufficient documentation

## 2021-09-30 DIAGNOSIS — I421 Obstructive hypertrophic cardiomyopathy: Secondary | ICD-10-CM | POA: Insufficient documentation

## 2021-10-27 ENCOUNTER — Telehealth (HOSPITAL_COMMUNITY): Payer: Self-pay | Admitting: *Deleted

## 2021-10-27 NOTE — Telephone Encounter (Signed)
Reaching out to patient to offer assistance regarding upcoming cardiac imaging study; pt verbalizes understanding of appt date/time, parking situation and where to check in, and verified current allergies; name and call back number provided for further questions should they arise  George Stanko RN Navigator Cardiac Imaging [facility] [phone] office [phone] cell  Patient denies claustrophobia or metal. 

## 2021-10-29 ENCOUNTER — Other Ambulatory Visit: Payer: Self-pay | Admitting: Cardiology

## 2021-10-29 ENCOUNTER — Ambulatory Visit (HOSPITAL_COMMUNITY)
Admission: RE | Admit: 2021-10-29 | Discharge: 2021-10-29 | Disposition: A | Discharge status: Home / Self Care | Payer: BC Managed Care – PPO | Source: Ambulatory Visit | Attending: Cardiology | Admitting: Cardiology

## 2021-10-29 DIAGNOSIS — I10 Essential (primary) hypertension: Secondary | ICD-10-CM

## 2021-10-29 DIAGNOSIS — I421 Obstructive hypertrophic cardiomyopathy: Secondary | ICD-10-CM | POA: Diagnosis present

## 2021-10-29 MED ORDER — GADOBUTROL 1 MMOL/ML IV SOLN
10.0000 mL | Freq: Once | INTRAVENOUS | Status: AC | PRN
  Administered 2021-10-29: 10 mL via INTRAVENOUS

## 2021-11-10 ENCOUNTER — Other Ambulatory Visit: Payer: Self-pay | Admitting: *Deleted

## 2021-11-10 DIAGNOSIS — R911 Solitary pulmonary nodule: Secondary | ICD-10-CM

## 2021-11-17 ENCOUNTER — Ambulatory Visit
Admission: RE | Admit: 2021-11-17 | Discharge: 2021-11-17 | Disposition: A | Discharge status: Home / Self Care | Payer: BC Managed Care – PPO | Source: Ambulatory Visit | Attending: Cardiology | Admitting: Cardiology

## 2021-11-17 DIAGNOSIS — R911 Solitary pulmonary nodule: Secondary | ICD-10-CM

## 2021-11-27 ENCOUNTER — Other Ambulatory Visit: Payer: Self-pay | Admitting: Cardiology

## 2021-12-05 ENCOUNTER — Other Ambulatory Visit: Payer: Self-pay | Admitting: Internal Medicine

## 2021-12-05 DIAGNOSIS — K648 Other hemorrhoids: Secondary | ICD-10-CM

## 2021-12-11 ENCOUNTER — Ambulatory Visit: Payer: BC Managed Care – PPO | Admitting: Adult Health

## 2021-12-11 ENCOUNTER — Encounter: Payer: Self-pay | Admitting: Adult Health

## 2021-12-11 VITALS — BP 140/88 | HR 59 | Temp 98.5°F | Ht 70.0 in | Wt 203.0 lb

## 2021-12-11 DIAGNOSIS — J0101 Acute recurrent maxillary sinusitis: Secondary | ICD-10-CM

## 2021-12-11 MED ORDER — DOXYCYCLINE HYCLATE 100 MG PO CAPS: 100.0000 mg | ORAL_CAPSULE | Freq: Two times a day (BID) | ORAL | 0 refills | Status: DC

## 2021-12-11 NOTE — Progress Notes (Signed)
Subjective:    Patient ID: George [name], male    DOB: 08/08/1950, 71 y.o.   MRN: [id number]  HPI 71 year old male who  has a past medical history of Allergy, Colon polyp, Diverticulosis, Essential hypertension (09/22/2008), External hemorrhoids, HOCM (hypertrophic obstructive cardiomyopathy) ([place]), Hypertension, Hypertrophic obstructive cardiomyopathy ([place]) (09/24/2008), Irregular heart beat, VASOMOTOR RHINITIS (12/24/2008), and VENTRICULAR HYPERTROPHY, LEFT (09/22/2008).  He presents to the office today for an acute on chronic issue. His symptoms started seven days ago. Symptoms include that of sinus pain and pressure, sinus congestion, bilateral ear pain/fullness, post nasal drip and fatigue   He has been using Mucinex and using singulair daily which helps to some degree.    Review of Systems See HPI   Past Medical History:  Diagnosis Date   Allergy    Colon polyp    Mucosal prolapse polyp   Diverticulosis    Essential hypertension 09/22/2008   Qualifier: Diagnosis of  By: Burnett Kanaris     External hemorrhoids    HOCM (hypertrophic obstructive cardiomyopathy) (HCC)    Hypertension    Hypertrophic obstructive cardiomyopathy ([place]) 09/24/2008   Qualifier: Diagnosis of  By: Stanford Breed, MD, Kandyce Rud    Irregular heart beat    VASOMOTOR RHINITIS 12/24/2008   Qualifier: Diagnosis of  By: Burnice Logan  MD, Doretha Sou    VENTRICULAR HYPERTROPHY, LEFT 09/22/2008   Qualifier: Diagnosis of  By: Burnett Kanaris      Social History   Socioeconomic History   Marital status: Married    Spouse name: Not on file   Number of children: 0   Years of education: Not on file   Highest education level: Bachelor's degree (e.g., BA, AB, BS)  Occupational History   Occupation: Facilities manager  Tobacco Use   Smoking status: Former    Types: Cigarettes    Quit date: 05/06/1984    Years since quitting: 37.6   Smokeless tobacco: Never  Vaping Use   Vaping Use: Never used  Substance  and Sexual Activity   Alcohol use: Not Currently    Alcohol/week: 0.0 standard drinks of alcohol   Drug use: No   Sexual activity: Not Currently  Other Topics Concern   Not on file  Social History Narrative   Not on file   Social Determinants of Health   Financial Resource Strain: Low Risk  (12/26/2020)   Overall Financial Resource Strain (CARDIA)    Difficulty of Paying Living Expenses: Not hard at all  Food Insecurity: No Food Insecurity (12/26/2020)   Hunger Vital Sign    Worried About Running Out of Food in the Last Year: Never true    Ran Out of Food in the Last Year: Never true  Transportation Needs: No Transportation Needs (12/26/2020)   PRAPARE - Hydrologist (Medical): No    Lack of Transportation (Non-Medical): No  Physical Activity: Insufficiently Active (12/26/2020)   Exercise Vital Sign    Days of Exercise per Week: 3 days    Minutes of Exercise per Session: 30 min  Stress: No Stress Concern Present (12/26/2020)   [place]    Feeling of Stress : Only a little  Social Connections: Unknown (12/26/2020)   Social Connection and Isolation Panel [NHANES]    Frequency of Communication with Friends and Family: Patient refused    Frequency of Social Gatherings with Friends and Family: Patient refused    Attends Religious Services: Patient  refused    Active Member of Clubs or Organizations: Patient refused    Attends Archivist Meetings: Not on file    Marital Status: Married  Human resources officer Violence: Not on file    Past Surgical History:  Procedure Laterality Date   COLONOSCOPY     TONSILLECTOMY      Family History  Problem Relation Age of Onset   Pancreatic cancer Father    Colon polyps Father        Benign   Colon cancer Neg Hx    Diabetes Neg Hx    Kidney disease Neg Hx    Gallbladder disease Neg Hx    Esophageal cancer Neg Hx    Rectal cancer Neg  Hx    Stomach cancer Neg Hx     Allergies  Allergen Reactions   Sulfonamide Derivatives     Current Outpatient Medications on File Prior to Visit  Medication Sig Dispense Refill   ANUCORT-HC 25 MG suppository UNWRAP AND INSERT ONE SUPPOSITORY RECTALLY TWICE DAILY 12 suppository 0   azelastine (ASTELIN) 0.1 % nasal spray Place 1 spray into both nostrils 2 (two) times daily. Use in each nostril as directed     desloratadine (CLARINEX) 5 MG tablet Take 5 mg by mouth daily.     fluticasone (FLONASE) 50 MCG/ACT nasal spray SPRAY 2 SPRAYS INTO EACH NOSTRIL EVERY DAY 48 mL 2   metoprolol succinate (TOPROL-XL) 25 MG 24 hr tablet TAKE 1 TABLET BY MOUTH EVERY DAY 90 tablet 3   montelukast (SINGULAIR) 10 MG tablet Take 10 mg by mouth daily.     Multiple Vitamin (MULTIVITAMIN) tablet Take 1 tablet by mouth daily.     OVER THE COUNTER MEDICATION Neuriva     RESVERATROL PO Take 325 each by mouth.     No current facility-administered medications on file prior to visit.    BP (!) 140/88   Pulse (!) 59   Temp 98.5 F (36.9 C) (Oral)   Ht '5\' 10"'$  (1.778 m)   Wt 203 lb (92.1 kg)   SpO2 99%   BMI 29.13 kg/m       Objective:   Physical Exam Vitals and nursing note reviewed.  Constitutional:      Appearance: Normal appearance.  HENT:     Right Ear: A middle ear effusion is present. Tympanic membrane is not erythematous.     Left Ear: A middle ear effusion is present. Tympanic membrane is not erythematous.     Nose: Congestion and rhinorrhea present. Rhinorrhea is purulent.     Right Turbinates: Enlarged and swollen.     Left Turbinates: Enlarged and swollen.     Right Sinus: Maxillary sinus tenderness and frontal sinus tenderness present.     Left Sinus: Maxillary sinus tenderness and frontal sinus tenderness present.  Eyes:     Extraocular Movements: Extraocular movements intact.     Conjunctiva/sclera: Conjunctivae normal.     Pupils: Pupils are equal, round, and reactive to light.   Cardiovascular:     Rate and Rhythm: Normal rate and regular rhythm.     Pulses: Normal pulses.     Heart sounds: Normal heart sounds.  Pulmonary:     Effort: Pulmonary effort is normal.     Breath sounds: Normal breath sounds.  Musculoskeletal:        General: Normal range of motion.  Skin:    General: Skin is warm and dry.     Capillary Refill: Capillary refill takes less than  2 seconds.  Neurological:     General: No focal deficit present.     Mental Status: He is alert and oriented to person, place, and time.  Psychiatric:        Mood and Affect: Mood normal.        Behavior: Behavior normal. Behavior is cooperative.        Thought Content: Thought content normal.        Judgment: Judgment normal.       Assessment & Plan:  1. Acute recurrent maxillary sinusitis  - doxycycline (VIBRAMYCIN) 100 MG capsule; Take 1 capsule (100 mg total) by mouth 2 (two) times daily.  Dispense: 14 capsule; Refill: 0   Dorothyann Peng, NP

## 2022-02-04 ENCOUNTER — Other Ambulatory Visit: Payer: Self-pay | Admitting: Internal Medicine

## 2022-02-04 DIAGNOSIS — K648 Other hemorrhoids: Secondary | ICD-10-CM

## 2022-03-26 ENCOUNTER — Encounter: Payer: Self-pay | Admitting: Gastroenterology

## 2022-04-02 ENCOUNTER — Ambulatory Visit (INDEPENDENT_AMBULATORY_CARE_PROVIDER_SITE_OTHER): Payer: 59 | Admitting: Internal Medicine

## 2022-04-02 ENCOUNTER — Other Ambulatory Visit (INDEPENDENT_AMBULATORY_CARE_PROVIDER_SITE_OTHER): Payer: 59

## 2022-04-02 ENCOUNTER — Encounter: Payer: Self-pay | Admitting: Internal Medicine

## 2022-04-02 VITALS — BP 130/84 | HR 50 | Temp 98.1°F | Ht 69.5 in | Wt 203.3 lb

## 2022-04-02 DIAGNOSIS — E782 Mixed hyperlipidemia: Secondary | ICD-10-CM

## 2022-04-02 DIAGNOSIS — I10 Essential (primary) hypertension: Secondary | ICD-10-CM | POA: Diagnosis not present

## 2022-04-02 DIAGNOSIS — I421 Obstructive hypertrophic cardiomyopathy: Secondary | ICD-10-CM | POA: Diagnosis not present

## 2022-04-02 DIAGNOSIS — K648 Other hemorrhoids: Secondary | ICD-10-CM

## 2022-04-02 DIAGNOSIS — Z0000 Encounter for general adult medical examination without abnormal findings: Secondary | ICD-10-CM

## 2022-04-02 LAB — CBC WITH DIFFERENTIAL/PLATELET
Basophils Absolute: 0.1 10*3/uL (ref 0.0–0.1)
Basophils Relative: 0.9 % (ref 0.0–3.0)
Eosinophils Absolute: 0.2 10*3/uL (ref 0.0–0.7)
Eosinophils Relative: 3.5 % (ref 0.0–5.0)
HCT: 41.9 % (ref 39.0–52.0)
Hemoglobin: 14.6 g/dL (ref 13.0–17.0)
Lymphocytes Relative: 24.2 % (ref 12.0–46.0)
Lymphs Abs: 1.7 10*3/uL (ref 0.7–4.0)
MCHC: 34.8 g/dL (ref 30.0–36.0)
MCV: 84.3 fl (ref 78.0–100.0)
Monocytes Absolute: 0.7 10*3/uL (ref 0.1–1.0)
Monocytes Relative: 9 % (ref 3.0–12.0)
Neutro Abs: 4.5 10*3/uL (ref 1.4–7.7)
Neutrophils Relative %: 62.4 % (ref 43.0–77.0)
Platelets: 189 10*3/uL (ref 150.0–400.0)
RBC: 4.97 Mil/uL (ref 4.22–5.81)
RDW: 14.1 % (ref 11.5–15.5)
WBC: 7.2 10*3/uL (ref 4.0–10.5)

## 2022-04-02 LAB — VITAMIN D 25 HYDROXY (VIT D DEFICIENCY, FRACTURES): VITD: 36.25 ng/mL (ref 30.00–100.00)

## 2022-04-02 LAB — COMPREHENSIVE METABOLIC PANEL
ALT: 13 U/L (ref 0–53)
AST: 21 U/L (ref 0–37)
Albumin: 4.6 g/dL (ref 3.5–5.2)
Alkaline Phosphatase: 50 U/L (ref 39–117)
BUN: 18 mg/dL (ref 6–23)
CO2: 29 mEq/L (ref 19–32)
Calcium: 9.6 mg/dL (ref 8.4–10.5)
Chloride: 100 mEq/L (ref 96–112)
Creatinine, Ser: 1.03 mg/dL (ref 0.40–1.50)
GFR: 73.75 mL/min (ref >60.00)
Glucose, Bld: 105 mg/dL — ABNORMAL HIGH (ref 70–99)
Potassium: 4.4 mEq/L (ref 3.5–5.1)
Sodium: 137 mEq/L (ref 135–145)
Total Bilirubin: 0.7 mg/dL (ref 0.2–1.2)
Total Protein: 6.9 g/dL (ref 6.0–8.3)

## 2022-04-02 LAB — LIPID PANEL
Cholesterol: 169 mg/dL (ref 0–200)
HDL: 49.7 mg/dL (ref >39.00)
LDL Cholesterol: 106 mg/dL — ABNORMAL HIGH (ref 0–99)
NonHDL: 118.93
Total CHOL/HDL Ratio: 3
Triglycerides: 67 mg/dL (ref 0.0–149.0)
VLDL: 13.4 mg/dL (ref 0.0–40.0)

## 2022-04-02 LAB — TSH: TSH: 0.99 u[IU]/mL (ref 0.35–5.50)

## 2022-04-02 LAB — PSA: PSA: 0.67 ng/mL (ref 0.10–4.00)

## 2022-04-02 LAB — VITAMIN B12: Vitamin B-12: 526 pg/mL (ref 211–911)

## 2022-04-02 MED ORDER — HYDROCORTISONE ACETATE 25 MG RE SUPP: RECTAL | 0 refills | Status: DC

## 2022-04-02 NOTE — Progress Notes (Signed)
Established Patient Office Visit     CC/Reason for Visit: Preventive exam  HPI: George [name] is a 72 y.o. male who is coming in today for the above mentioned reasons. Past Medical History is significant for: Hypertension and hypertrophic obstructive cardiomyopathy followed by cardiology.  He is doing well.  He has routine eye and dental care.  He is due for RSV, flu and COVID vaccines.  He had a colonoscopy in 2021.   Past Medical/Surgical History: Past Medical History:  Diagnosis Date   Allergy    Colon polyp    Mucosal prolapse polyp   Diverticulosis    Essential hypertension 09/22/2008   Qualifier: Diagnosis of  By: Burnett Kanaris     External hemorrhoids    HOCM (hypertrophic obstructive cardiomyopathy) ([place])    Hypertension    Hypertrophic obstructive cardiomyopathy ([place]) 09/24/2008   Qualifier: Diagnosis of  By: Stanford Breed, MD, Kandyce Rud    Irregular heart beat    VASOMOTOR RHINITIS 12/24/2008   Qualifier: Diagnosis of  By: Burnice Logan  MD, Doretha Sou    VENTRICULAR HYPERTROPHY, LEFT 09/22/2008   Qualifier: Diagnosis of  By: Burnett Kanaris      Past Surgical History:  Procedure Laterality Date   COLONOSCOPY     TONSILLECTOMY      Social History:  reports that he quit smoking about 37 years ago. His smoking use included cigarettes. He has never used smokeless tobacco. He reports that he does not currently use alcohol. He reports that he does not use drugs.  Allergies: Allergies  Allergen Reactions   Sulfonamide Derivatives     Family History:  Family History  Problem Relation Age of Onset   Pancreatic cancer Father    Colon polyps Father        Benign   Colon cancer Neg Hx    Diabetes Neg Hx    Kidney disease Neg Hx    Gallbladder disease Neg Hx    Esophageal cancer Neg Hx    Rectal cancer Neg Hx    Stomach cancer Neg Hx      Current Outpatient Medications:    azelastine (ASTELIN) 0.1 % nasal spray, Place 1 spray into both nostrils  2 (two) times daily. Use in each nostril as directed, Disp: , Rfl:    desloratadine (CLARINEX) 5 MG tablet, Take 5 mg by mouth daily., Disp: , Rfl:    doxycycline (VIBRAMYCIN) 100 MG capsule, Take 1 capsule (100 mg total) by mouth 2 (two) times daily., Disp: 14 capsule, Rfl: 0   fluticasone (FLONASE) 50 MCG/ACT nasal spray, SPRAY 2 SPRAYS INTO EACH NOSTRIL EVERY DAY, Disp: 48 mL, Rfl: 2   metoprolol succinate (TOPROL-XL) 25 MG 24 hr tablet, TAKE 1 TABLET BY MOUTH EVERY DAY, Disp: 90 tablet, Rfl: 3   montelukast (SINGULAIR) 10 MG tablet, Take 10 mg by mouth daily., Disp: , Rfl:    Multiple Vitamin (MULTIVITAMIN) tablet, Take 1 tablet by mouth daily., Disp: , Rfl:    RESVERATROL PO, Take 325 each by mouth., Disp: , Rfl:    hydrocortisone (ANUCORT-HC) 25 MG suppository, UNWRAP AND INSERT ONE SUPPOSITORY RECTALLY TWICE DAILY, Disp: 12 suppository, Rfl: 0  Review of Systems:  Negative unless indicated in HPI.   Physical Exam: Vitals:   04/02/22 0835  BP: 130/84  Pulse: (!) 50  Temp: 98.1 F (36.7 C)  TempSrc: Oral  SpO2: 99%  Weight: 203 lb 4.8 oz (92.2 kg)  Height: 5' 9.5" (1.765 m)  Body mass index is 29.59 kg/m.   Physical Exam Vitals reviewed.  Constitutional:      General: He is not in acute distress.    Appearance: Normal appearance. He is not ill-appearing, toxic-appearing or diaphoretic.  HENT:     Head: Normocephalic.     Right Ear: Tympanic membrane, ear canal and external ear normal. There is no impacted cerumen.     Left Ear: Tympanic membrane, ear canal and external ear normal. There is no impacted cerumen.     Nose: Nose normal.     Mouth/Throat:     Mouth: Mucous membranes are moist.     Pharynx: Oropharynx is clear. No oropharyngeal exudate or posterior oropharyngeal erythema.  Eyes:     General: No scleral icterus.       Right eye: No discharge.        Left eye: No discharge.     Conjunctiva/sclera: Conjunctivae normal.     Pupils: Pupils are equal,  round, and reactive to light.  Neck:     Vascular: No carotid bruit.  Cardiovascular:     Rate and Rhythm: Normal rate and regular rhythm.     Pulses: Normal pulses.     Heart sounds: Normal heart sounds.  Pulmonary:     Effort: Pulmonary effort is normal. No respiratory distress.     Breath sounds: Normal breath sounds.  Abdominal:     General: Abdomen is flat. Bowel sounds are normal.     Palpations: Abdomen is soft.  Musculoskeletal:        General: Normal range of motion.     Cervical back: Normal range of motion.  Skin:    General: Skin is warm and dry.  Neurological:     General: No focal deficit present.     Mental Status: He is alert and oriented to person, place, and time. Mental status is at baseline.  Psychiatric:        Mood and Affect: Mood normal.        Behavior: Behavior normal.        Thought Content: Thought content normal.        Judgment: Judgment normal.     [place] Office Visit from 04/02/2022 in [facility]  PHQ-9 Total Score 0         09/13/2018    9:03 AM 12/26/2020    9:42 AM 09/30/2021    9:50 AM 04/02/2022    8:34 AM  [place] in the past year? 0 0  1  Was there an injury with Fall? 0   0  Fall Risk Category Calculator 0   1  Fall Risk Category (Retired) Low     (RETIRED) Patient Fall Risk Level   Low fall risk   Fall risk Follow up    Falls evaluation completed     Impression and Plan:  Encounter for preventive health examination - Plan: PSA  Mixed hyperlipidemia - Plan: Lipid panel  Essential hypertension - Plan: CBC with Differential/Platelet, Comprehensive metabolic panel  Hypertrophic obstructive cardiomyopathy ([place]) - Plan: TSH, Vitamin B12, Vitamin D, 25-hydroxy  Internal hemorrhoids - Plan: hydrocortisone (ANUCORT-HC) 25 MG suppository  -Recommend routine eye and dental care. -Healthy lifestyle discussed in detail. -Labs to be updated today. -Prostate cancer screening: PSA  today Health Maintenance  Topic Date Due   Flu Shot  04/12/2022*   COVID-19 Vaccine (4 - 2023-24 season) 10/03/2022*   Colon Cancer Screening  04/26/2022  DTaP/Tdap/Td vaccine (2 - Td or Tdap) 02/13/2030   Pneumonia Vaccine  Completed   Hepatitis C Screening: USPSTF Recommendation to screen - Ages 7-79 yo.  Completed   Zoster (Shingles) Vaccine  Completed   HPV Vaccine  Aged Out  *Topic was postponed. The date shown is not the original due date.       Lelon Frohlich, MD [facility] at [facility]

## 2022-04-02 NOTE — Addendum Note (Signed)
Addended by: Westley Hummer B on: 04/02/2022 09:13 AM   Modules accepted: Orders

## 2022-04-07 ENCOUNTER — Encounter: Payer: Self-pay | Admitting: Gastroenterology

## 2022-04-27 ENCOUNTER — Encounter: Payer: 59 | Admitting: Gastroenterology

## 2022-06-11 ENCOUNTER — Ambulatory Visit: Payer: 59 | Admitting: Internal Medicine

## 2022-06-11 ENCOUNTER — Encounter: Payer: Self-pay | Admitting: Internal Medicine

## 2022-06-11 VITALS — BP 153/106 | HR 57 | Temp 98.5°F | Wt 203.2 lb

## 2022-06-11 DIAGNOSIS — J069 Acute upper respiratory infection, unspecified: Secondary | ICD-10-CM

## 2022-06-11 DIAGNOSIS — J0100 Acute maxillary sinusitis, unspecified: Secondary | ICD-10-CM | POA: Diagnosis not present

## 2022-06-11 MED ORDER — PREDNISONE 10 MG (21) PO TBPK: ORAL_TABLET | ORAL | 0 refills | Status: DC

## 2022-06-11 NOTE — Progress Notes (Signed)
Established Patient Office Visit     CC/Reason for Visit: URI symptoms  HPI: George [name] is a 72 y.o. male who is coming in today for the above mentioned reasons.  For the past 2 weeks he has been having headache, congestion, rhinorrhea, and a sense of his ears popping.  He has been having significant nasal congestion to the point where he cannot sleep unless he is sitting upright.  He has taken some over-the-counter cold medicine which likely accounts for his very elevated blood pressure today.     Past Medical/Surgical History: Past Medical History:  Diagnosis Date   Allergy    Colon polyp    Mucosal prolapse polyp   Diverticulosis    Essential hypertension 09/22/2008   Qualifier: Diagnosis of  By: Kem Parkinson     External hemorrhoids    HOCM (hypertrophic obstructive cardiomyopathy) (HCC)    Hypertension    Hypertrophic obstructive cardiomyopathy (HCC) 09/24/2008   Qualifier: Diagnosis of  By: [name], MD, Lyn Hollingshead    Irregular heart beat    VASOMOTOR RHINITIS 12/24/2008   Qualifier: Diagnosis of  By: Amador Cunas  MD, Janett Labella    VENTRICULAR HYPERTROPHY, LEFT 09/22/2008   Qualifier: Diagnosis of  By: Kem Parkinson      Past Surgical History:  Procedure Laterality Date   COLONOSCOPY     TONSILLECTOMY      Social History:  reports that he quit smoking about 38 years ago. His smoking use included cigarettes. He has never used smokeless tobacco. He reports that he does not currently use alcohol. He reports that he does not use drugs.  Allergies: Allergies  Allergen Reactions   Sulfonamide Derivatives     Family History:  Family History  Problem Relation Age of Onset   Pancreatic cancer Father    Colon polyps Father        Benign   Colon cancer Neg Hx    Diabetes Neg Hx    Kidney disease Neg Hx    Gallbladder disease Neg Hx    Esophageal cancer Neg Hx    Rectal cancer Neg Hx    Stomach cancer Neg Hx      Current Outpatient  Medications:    azelastine (ASTELIN) 0.1 % nasal spray, Place 1 spray into both nostrils 2 (two) times daily. Use in each nostril as directed, Disp: , Rfl:    desloratadine (CLARINEX) 5 MG tablet, Take 5 mg by mouth daily., Disp: , Rfl:    doxycycline (VIBRAMYCIN) 100 MG capsule, Take 1 capsule (100 mg total) by mouth 2 (two) times daily., Disp: 14 capsule, Rfl: 0   fluticasone (FLONASE) 50 MCG/ACT nasal spray, SPRAY 2 SPRAYS INTO EACH NOSTRIL EVERY DAY, Disp: 48 mL, Rfl: 2   hydrocortisone (ANUCORT-HC) 25 MG suppository, UNWRAP AND INSERT ONE SUPPOSITORY RECTALLY TWICE DAILY, Disp: 12 suppository, Rfl: 0   metoprolol succinate (TOPROL-XL) 25 MG 24 hr tablet, TAKE 1 TABLET BY MOUTH EVERY DAY, Disp: 90 tablet, Rfl: 3   montelukast (SINGULAIR) 10 MG tablet, Take 10 mg by mouth daily., Disp: , Rfl:    Multiple Vitamin (MULTIVITAMIN) tablet, Take 1 tablet by mouth daily., Disp: , Rfl:    predniSONE (STERAPRED UNI-PAK 21 TAB) 10 MG (21) TBPK tablet, Take as directed, Disp: 21 tablet, Rfl: 0   RESVERATROL PO, Take 325 each by mouth., Disp: , Rfl:   Review of Systems:  Negative unless indicated in HPI.   Physical Exam: Vitals:   06/11/22 1433  06/11/22 1438  BP: (!) 180/100 (!) 153/106  Pulse: (!) 57   Temp: 98.5 F (36.9 C)   TempSrc: Oral   SpO2: 98%   Weight: 203 lb 3.2 oz (92.2 kg)     Body mass index is 29.58 kg/m.   Physical Exam Vitals reviewed.  Constitutional:      Appearance: Normal appearance.  HENT:     Right Ear: Tympanic membrane, ear canal and external ear normal.     Left Ear: Tympanic membrane, ear canal and external ear normal.     Mouth/Throat:     Mouth: Mucous membranes are moist.     Pharynx: Pharyngeal swelling present.  Eyes:     Conjunctiva/sclera: Conjunctivae normal.     Pupils: Pupils are equal, round, and reactive to light.  Cardiovascular:     Rate and Rhythm: Normal rate and regular rhythm.  Pulmonary:     Effort: Pulmonary effort is normal.      Breath sounds: Normal breath sounds.  Neurological:     Mental Status: He is alert.      Impression and Plan:  Viral upper respiratory tract infection  Acute maxillary sinusitis, recurrence not specified -     predniSONE; Take as directed  Dispense: 21 tablet; Refill: 0   -Given exam findings, PNA, pharyngitis, ear infection are not likely, hence abx have not been prescribed. -Have advised rest, fluids, OTC antihistamines, cough suppressants and mucinex. -RTC if no improvement in 10-14 days. -He is requesting prednisone due to severe nasal congestion and sinusitis that is impairing sleep.  I think this is appropriate so I will send a prednisone taper.   Time spent:22 minutes reviewing chart, interviewing and examining patient and formulating plan of care.     Chaya Jan, MD [facility] at [facility]

## 2022-06-12 ENCOUNTER — Ambulatory Visit: Payer: 59 | Admitting: Family Medicine

## 2022-06-23 ENCOUNTER — Encounter: Payer: Self-pay | Admitting: Internal Medicine

## 2022-06-25 ENCOUNTER — Ambulatory Visit: Payer: 59 | Admitting: Internal Medicine

## 2022-06-29 ENCOUNTER — Ambulatory Visit: Payer: 59 | Admitting: Internal Medicine

## 2022-07-07 ENCOUNTER — Other Ambulatory Visit: Payer: Self-pay | Admitting: Internal Medicine

## 2022-07-07 DIAGNOSIS — K648 Other hemorrhoids: Secondary | ICD-10-CM

## 2022-07-21 ENCOUNTER — Other Ambulatory Visit: Payer: Self-pay | Admitting: Internal Medicine

## 2022-07-21 DIAGNOSIS — K648 Other hemorrhoids: Secondary | ICD-10-CM

## 2022-08-11 ENCOUNTER — Other Ambulatory Visit: Payer: Self-pay | Admitting: Internal Medicine

## 2022-08-11 DIAGNOSIS — K648 Other hemorrhoids: Secondary | ICD-10-CM

## 2022-08-20 NOTE — Progress Notes (Deleted)
HPI: FU HOCM. A previous Myoview in July 2008 showed no ischemia or infarction. Abd ultrasound 3/18 showed no aneurysm. Echocardiogram October 2022 showed normal LV function, severe asymmetric septal hypertrophy, grade 1 diastolic dysfunction, systolic anterior motion of the mitral valve, mild to moderate left atrial enlargement; findings felt consistent with hypertrophic cardiomyopathy. Monitor 8/23 showed sinus rhythm with PACs, 10 beats SVT, PVCs but no nonsustained ventricular tachycardia.  Exercise treadmill August 2023 showed normal systolic blood pressure response.  Cardiac MRI October 2023 showed severe asymmetric septal hypertrophy consistent with hypertrophic cardiomyopathy, systolic anterior motion of the mitral valve, overall normal LV and RV function and possible left upper lobe pulmonary nodule with CT recommended.  Follow-up chest CT November 2023 showed 4 mm nodule and follow-up recommended in 12 months.  Since last seen,   Current Outpatient Medications  Medication Sig Dispense Refill   azelastine (ASTELIN) 0.1 % nasal spray Place 1 spray into both nostrils 2 (two) times daily. Use in each nostril as directed     desloratadine (CLARINEX) 5 MG tablet Take 5 mg by mouth daily.     doxycycline (VIBRAMYCIN) 100 MG capsule Take 1 capsule (100 mg total) by mouth 2 (two) times daily. 14 capsule 0   fluticasone (FLONASE) 50 MCG/ACT nasal spray SPRAY 2 SPRAYS INTO EACH NOSTRIL EVERY DAY 48 mL 2   hydrocortisone (ANUSOL-HC) 25 MG suppository place 1 unwrapped suppository rectally twice a day 12 suppository 0   metoprolol succinate (TOPROL-XL) 25 MG 24 hr tablet TAKE 1 TABLET BY MOUTH EVERY DAY 90 tablet 3   montelukast (SINGULAIR) 10 MG tablet Take 10 mg by mouth daily.     Multiple Vitamin (MULTIVITAMIN) tablet Take 1 tablet by mouth daily.     predniSONE (STERAPRED UNI-PAK 21 TAB) 10 MG (21) TBPK tablet Take as directed 21 tablet 0   RESVERATROL PO Take 325 each by mouth.     No  current facility-administered medications for this visit.     Past Medical History:  Diagnosis Date   Allergy    Colon polyp    Mucosal prolapse polyp   Diverticulosis    Essential hypertension 09/22/2008   Qualifier: Diagnosis of  By: Kem Parkinson     External hemorrhoids    HOCM (hypertrophic obstructive cardiomyopathy) (HCC)    Hypertension    Hypertrophic obstructive cardiomyopathy (HCC) 09/24/2008   Qualifier: Diagnosis of  By: [name], MD, Lyn Hollingshead    Irregular heart beat    VASOMOTOR RHINITIS 12/24/2008   Qualifier: Diagnosis of  By: Amador Cunas  MD, Janett Labella    VENTRICULAR HYPERTROPHY, LEFT 09/22/2008   Qualifier: Diagnosis of  By: Kem Parkinson      Past Surgical History:  Procedure Laterality Date   COLONOSCOPY     TONSILLECTOMY      Social History   Socioeconomic History   Marital status: Married    Spouse name: Not on file   Number of children: 0   Years of education: Not on file   Highest education level: Bachelor's degree (e.g., BA, AB, BS)  Occupational History   Occupation: Building surveyor  Tobacco Use   Smoking status: Former    Current packs/day: 0.00    Types: Cigarettes    Quit date: 05/06/1984    Years since quitting: 38.3   Smokeless tobacco: Never  Vaping Use   Vaping status: Never Used  Substance and Sexual Activity   Alcohol use: Not Currently    Alcohol/week: 0.0 standard  drinks of alcohol   Drug use: No   Sexual activity: Not Currently  Other Topics Concern   Not on file  Social History Narrative   Not on file   Social Determinants of Health   Financial Resource Strain: Low Risk  (12/26/2020)   Overall Financial Resource Strain (CARDIA)    Difficulty of Paying Living Expenses: Not hard at all  Food Insecurity: No Food Insecurity (12/26/2020)   Hunger Vital Sign    Worried About Running Out of Food in the Last Year: Never true    Ran Out of Food in the Last Year: Never true  Transportation Needs: No  Transportation Needs (12/26/2020)   PRAPARE - Administrator, Civil Service (Medical): No    Lack of Transportation (Non-Medical): No  Physical Activity: Insufficiently Active (12/26/2020)   Exercise Vital Sign    Days of Exercise per Week: 3 days    Minutes of Exercise per Session: 30 min  Stress: No Stress Concern Present (12/26/2020)   [employer] of Occupational Health - Occupational Stress Questionnaire    Feeling of Stress : Only a little  Social Connections: Unknown (12/26/2020)   Social Connection and Isolation Panel [NHANES]    Frequency of Communication with Friends and Family: Patient declined    Frequency of Social Gatherings with Friends and Family: Patient declined    Attends Religious Services: Patient declined    Database administrator or Organizations: Patient declined    Attends Engineer, structural: Not on file    Marital Status: Married  Catering manager Violence: Not on file    Family History  Problem Relation Age of Onset   Pancreatic cancer Father    Colon polyps Father        Benign   Colon cancer Neg Hx    Diabetes Neg Hx    Kidney disease Neg Hx    Gallbladder disease Neg Hx    Esophageal cancer Neg Hx    Rectal cancer Neg Hx    Stomach cancer Neg Hx     ROS: no fevers or chills, productive cough, hemoptysis, dysphasia, odynophagia, melena, hematochezia, dysuria, hematuria, rash, seizure activity, orthopnea, PND, pedal edema, claudication. Remaining systems are negative.  Physical Exam: Well-developed well-nourished in no acute distress.  Skin is warm and dry.  HEENT is normal.  Neck is supple.  Chest is clear to auscultation with normal expansion.  Cardiovascular exam is regular rate and rhythm.  Abdominal exam nontender or distended. No masses palpated. Extremities show no edema. neuro grossly intact  ECG- personally reviewed  A/P  1 hypertrophic obstructive cardiomyopathy-patient's symptoms are reasonably  well-controlled.  Continue beta-blocker at present dose.  He has no risk factors for sudden cardiac death including normal systolic blood pressure response to exercise, no nonsustained ventricular tachycardia noted on monitor, no family history of sudden death and no history of syncope.  2 hypertension-blood pressure controlled.  Continue present medical regimen.  3 history of positive heterozygous for prothrombin gene mutation factor II-follow-up hematology oncology.  Olga Millers, MD

## 2022-08-28 ENCOUNTER — Ambulatory Visit: Payer: 59 | Admitting: Cardiology

## 2022-09-02 ENCOUNTER — Other Ambulatory Visit: Payer: Self-pay | Admitting: Internal Medicine

## 2022-09-02 DIAGNOSIS — K648 Other hemorrhoids: Secondary | ICD-10-CM

## 2022-09-17 ENCOUNTER — Encounter: Payer: Self-pay | Admitting: Gastroenterology

## 2022-09-17 NOTE — Progress Notes (Signed)
HPI: FU HOCM. A previous Myoview in July 2008 showed no ischemia or infarction. Echocardiogram October 2022 showed normal LV function, severe asymmetric septal hypertrophy, grade 1 diastolic dysfunction, systolic anterior motion of the mitral valve, mild to moderate left atrial enlargement; findings felt consistent with hypertrophic cardiomyopathy.  Monitor August 2023 showed sinus rhythm with 10 beats of PAT, PVCs but no nonsustained ventricular tachycardia.  Exercise treadmill August 2023 showed normal systolic blood pressure response.  Cardiac MRI October 2023 showed severe asymmetric septal hypertrophy (21 mm in the basal septum and 14 mm in the posterior wall), findings consistent with hypertrophic cardiomyopathy, less than 1% LGE the myocardial mass uptake, possible left upper lobe pulmonary nodule and CT recommended.  Follow-up chest CT November 2023 showed 4 mm nodule in the left major fissure and follow-up recommended 12 months.  Since last seen, the patient denies any dyspnea on exertion, orthopnea, PND, pedal edema, palpitations, syncope or chest pain.   Current Outpatient Medications  Medication Sig Dispense Refill   azelastine (ASTELIN) 0.1 % nasal spray Place 1 spray into both nostrils 2 (two) times daily. Use in each nostril as directed     desloratadine (CLARINEX) 5 MG tablet Take 5 mg by mouth daily.     doxycycline (VIBRAMYCIN) 100 MG capsule Take 1 capsule (100 mg total) by mouth 2 (two) times daily. 14 capsule 0   fluticasone (FLONASE) 50 MCG/ACT nasal spray SPRAY 2 SPRAYS INTO EACH NOSTRIL EVERY DAY 48 mL 2   hydrocortisone (ANUSOL-HC) 25 MG suppository UNWRAP AND INSERT ONE SUPPOSITORY RECTALLY TWICE DAILY 12 suppository 0   metoprolol succinate (TOPROL-XL) 25 MG 24 hr tablet TAKE 1 TABLET BY MOUTH EVERY DAY 90 tablet 3   montelukast (SINGULAIR) 10 MG tablet Take 10 mg by mouth daily.     Multiple Vitamin (MULTIVITAMIN) tablet Take 1 tablet by mouth daily.     RESVERATROL  PO Take 325 each by mouth.     predniSONE (STERAPRED UNI-PAK 21 TAB) 10 MG (21) TBPK tablet Take as directed (Patient not taking: Reported on 09/25/2022) 21 tablet 0   No current facility-administered medications for this visit.     Past Medical History:  Diagnosis Date   Allergy    Colon polyp    Mucosal prolapse polyp   Diverticulosis    Essential hypertension 09/22/2008   Qualifier: Diagnosis of  By: Kem Parkinson     External hemorrhoids    HOCM (hypertrophic obstructive cardiomyopathy) (HCC)    Hypertension    Hypertrophic obstructive cardiomyopathy (HCC) 09/24/2008   Qualifier: Diagnosis of  By: [name], MD, Lyn Hollingshead    Irregular heart beat    VASOMOTOR RHINITIS 12/24/2008   Qualifier: Diagnosis of  By: Amador Cunas  MD, Janett Labella    VENTRICULAR HYPERTROPHY, LEFT 09/22/2008   Qualifier: Diagnosis of  By: Kem Parkinson      Past Surgical History:  Procedure Laterality Date   COLONOSCOPY     TONSILLECTOMY      Social History   Socioeconomic History   Marital status: Married    Spouse name: Not on file   Number of children: 0   Years of education: Not on file   Highest education level: Bachelor's degree (e.g., BA, AB, BS)  Occupational History   Occupation: Building surveyor  Tobacco Use   Smoking status: Former    Current packs/day: 0.00    Types: Cigarettes    Quit date: 05/06/1984    Years since quitting: 12.4  Smokeless tobacco: Never  Vaping Use   Vaping status: Never Used  Substance and Sexual Activity   Alcohol use: Not Currently    Alcohol/week: 0.0 standard drinks of alcohol   Drug use: No   Sexual activity: Not Currently  Other Topics Concern   Not on file  Social History Narrative   Not on file   Social Determinants of Health   Financial Resource Strain: Low Risk  (12/26/2020)   Overall Financial Resource Strain (CARDIA)    Difficulty of Paying Living Expenses: Not hard at all  Food Insecurity: No Food Insecurity  (12/26/2020)   Hunger Vital Sign    Worried About Running Out of Food in the Last Year: Never true    Ran Out of Food in the Last Year: Never true  Transportation Needs: No Transportation Needs (12/26/2020)   PRAPARE - Administrator, Civil Service (Medical): No    Lack of Transportation (Non-Medical): No  Physical Activity: Insufficiently Active (12/26/2020)   Exercise Vital Sign    Days of Exercise per Week: 3 days    Minutes of Exercise per Session: 30 min  Stress: No Stress Concern Present (12/26/2020)   [employer] of Occupational Health - Occupational Stress Questionnaire    Feeling of Stress : Only a little  Social Connections: Unknown (12/26/2020)   Social Connection and Isolation Panel [NHANES]    Frequency of Communication with Friends and Family: Patient declined    Frequency of Social Gatherings with Friends and Family: Patient declined    Attends Religious Services: Patient declined    Database administrator or Organizations: Patient declined    Attends Engineer, structural: Not on file    Marital Status: Married  Catering manager Violence: Not on file    Family History  Problem Relation Age of Onset   Pancreatic cancer Father    Colon polyps Father        Benign   Colon cancer Neg Hx    Diabetes Neg Hx    Kidney disease Neg Hx    Gallbladder disease Neg Hx    Esophageal cancer Neg Hx    Rectal cancer Neg Hx    Stomach cancer Neg Hx     ROS: no fevers or chills, productive cough, hemoptysis, dysphasia, odynophagia, melena, hematochezia, dysuria, hematuria, rash, seizure activity, orthopnea, PND, pedal edema, claudication. Remaining systems are negative.  Physical Exam: Well-developed well-nourished in no acute distress.  Skin is warm and dry.  HEENT is normal.  Neck is supple.  Chest is clear to auscultation with normal expansion.  2/6 systolic murmur left sternal border.  Increases with Valsalva. Cardiovascular exam is regular  rate and rhythm.  Abdominal exam nontender or distended. No masses palpated.  Positive bruit. Extremities show no edema. neuro grossly intact  EKG Interpretation Date/Time:  Friday September 25 2022 09:21:14 EDT Ventricular Rate:  57 PR Interval:  190 QRS Duration:  110 QT Interval:  440 QTC Calculation: 428 R Axis:   59  Text Interpretation: Sinus bradycardia Left ventricular hypertrophy with repolarization abnormality ( Sokolow-Lyon , Cornell product ) Anteroseptal infarct , possibly acute Confirmed by Olga Millers ([id number]) on 09/25/2022 9:26:14 AM     A/P  1 hypertrophic obstructive cardiomyopathy-will continue beta-blocker at present dose.  He has no risk factors for sudden cardiac death.  He has no children.  Plan follow-up echocardiogram October 2024.  2 hypertension--blood pressure controlled.  Continue present medical regimen.  3 lung nodule-he will need follow-up  noncontrast chest CT November.  4 bruit-schedule abdominal ultrasound to exclude aneurysm.  Olga Millers, MD

## 2022-09-19 ENCOUNTER — Other Ambulatory Visit: Payer: Self-pay | Admitting: Internal Medicine

## 2022-09-19 DIAGNOSIS — K648 Other hemorrhoids: Secondary | ICD-10-CM

## 2022-09-25 ENCOUNTER — Encounter: Payer: Self-pay | Admitting: Cardiology

## 2022-09-25 ENCOUNTER — Ambulatory Visit: Payer: 59 | Attending: Cardiology | Admitting: Cardiology

## 2022-09-25 VITALS — BP 128/82 | HR 61 | Ht 70.0 in | Wt 201.2 lb

## 2022-09-25 DIAGNOSIS — R918 Other nonspecific abnormal finding of lung field: Secondary | ICD-10-CM | POA: Diagnosis not present

## 2022-09-25 DIAGNOSIS — I10 Essential (primary) hypertension: Secondary | ICD-10-CM

## 2022-09-25 DIAGNOSIS — I421 Obstructive hypertrophic cardiomyopathy: Secondary | ICD-10-CM | POA: Diagnosis not present

## 2022-09-25 DIAGNOSIS — R911 Solitary pulmonary nodule: Secondary | ICD-10-CM | POA: Diagnosis not present

## 2022-09-25 DIAGNOSIS — R0989 Other specified symptoms and signs involving the circulatory and respiratory systems: Secondary | ICD-10-CM

## 2022-09-25 NOTE — Patient Instructions (Signed)
    Testing/Procedures:  Your physician has requested that you have an echocardiogram. Echocardiography is a painless test that uses sound waves to create images of your heart. It provides your doctor with information about the size and shape of your heart and how well your heart's chambers and valves are working. This procedure takes approximately one hour. There are no restrictions for this procedure. Please do NOT wear cologne, perfume, aftershave, or lotions (deodorant is allowed). Please arrive 15 minutes prior to your appointment time. [address] [facility]  Your physician has requested that you have an abdominal aorta duplex. During this test, an ultrasound is used to evaluate the aorta. Allow 30 minutes for this exam. Do not eat after midnight the day before and avoid carbonated beverages [facility]   CT WO TO FOLLOW UP NODULE AT [id number]-DUE IN NOVEMBER   Follow-Up: At [facility], you and your health needs are our priority.  As part of our continuing mission to provide you with exceptional heart care, we have created designated Provider Care Teams.  These Care Teams include your primary Cardiologist (physician) and Advanced Practice Providers (APPs -  Physician Assistants and Nurse Practitioners) who all work together to provide you with the care you need, when you need it.  We recommend signing up for the patient portal called "MyChart".  Sign up information is provided on this After Visit Summary.  MyChart is used to connect with patients for Virtual Visits (Telemedicine).  Patients are able to view lab/test results, encounter notes, upcoming appointments, etc.  Non-urgent messages can be sent to your provider as well.   To learn more about what you can do with MyChart, go to [email or web address].    Your next appointment:   12 month(s)  Provider:   Olga Millers, MD

## 2022-09-30 ENCOUNTER — Ambulatory Visit (HOSPITAL_COMMUNITY)
Admission: RE | Admit: 2022-09-30 | Discharge: 2022-09-30 | Disposition: A | Discharge status: Home / Self Care | Payer: 59 | Source: Ambulatory Visit | Attending: Cardiovascular Disease | Admitting: Cardiovascular Disease

## 2022-09-30 DIAGNOSIS — R0989 Other specified symptoms and signs involving the circulatory and respiratory systems: Secondary | ICD-10-CM | POA: Diagnosis present

## 2022-10-08 ENCOUNTER — Ambulatory Visit (HOSPITAL_COMMUNITY): Payer: 59 | Attending: Cardiology

## 2022-10-08 DIAGNOSIS — I421 Obstructive hypertrophic cardiomyopathy: Secondary | ICD-10-CM | POA: Insufficient documentation

## 2022-10-08 LAB — ECHOCARDIOGRAM COMPLETE
AV Mean grad: 10.5 mmHg
AV Peak grad: 19.2 mmHg
Ao pk vel: 2.19 m/s
Area-P 1/2: 2.39 cm2
Est EF: >75
S' Lateral: 3.1 cm

## 2022-10-09 ENCOUNTER — Encounter: Payer: Self-pay | Admitting: *Deleted

## 2022-11-17 ENCOUNTER — Ambulatory Visit
Admission: RE | Admit: 2022-11-17 | Discharge: 2022-11-17 | Disposition: A | Discharge status: Home / Self Care | Payer: Medicare Other | Source: Ambulatory Visit | Attending: Cardiology | Admitting: Cardiology

## 2022-11-17 ENCOUNTER — Other Ambulatory Visit: Payer: Self-pay | Admitting: Cardiology

## 2022-11-17 DIAGNOSIS — R911 Solitary pulmonary nodule: Secondary | ICD-10-CM

## 2022-11-17 DIAGNOSIS — R918 Other nonspecific abnormal finding of lung field: Secondary | ICD-10-CM

## 2022-11-19 ENCOUNTER — Ambulatory Visit (INDEPENDENT_AMBULATORY_CARE_PROVIDER_SITE_OTHER): Payer: Medicare Other | Admitting: Internal Medicine

## 2022-11-19 ENCOUNTER — Encounter: Payer: Self-pay | Admitting: Internal Medicine

## 2022-11-19 VITALS — BP 122/82 | HR 53 | Temp 98.4°F | Wt 200.7 lb

## 2022-11-19 DIAGNOSIS — M25562 Pain in left knee: Secondary | ICD-10-CM

## 2022-11-19 MED ORDER — MELOXICAM 7.5 MG PO TABS: 7.5000 mg | ORAL_TABLET | Freq: Every day | ORAL | 0 refills | Status: DC

## 2022-11-19 NOTE — Progress Notes (Signed)
Established Patient Office Visit     CC/Reason for Visit: Left knee pain  HPI: George [name] is a 72 y.o. male who is coming in today for the above mentioned reasons.  For the past 3 weeks has been experiencing posterior left knee and left upper calf pain.  He stated this started when he was doing some yard work and with his left foot planted on the ground his body pivoted to the left.  Ever since then has been somewhat painful.  No swelling, pain is worse in the posterior part of the knee with full extension.  Past Medical/Surgical History: Past Medical History:  Diagnosis Date   Allergy    Colon polyp    Mucosal prolapse polyp   Diverticulosis    Essential hypertension 09/22/2008   Qualifier: Diagnosis of  By: Kem Parkinson     External hemorrhoids    HOCM (hypertrophic obstructive cardiomyopathy) (HCC)    Hypertension    Hypertrophic obstructive cardiomyopathy (HCC) 09/24/2008   Qualifier: Diagnosis of  By: [name], MD, Lyn Hollingshead    Irregular heart beat    VASOMOTOR RHINITIS 12/24/2008   Qualifier: Diagnosis of  By: Amador Cunas  MD, Janett Labella    VENTRICULAR HYPERTROPHY, LEFT 09/22/2008   Qualifier: Diagnosis of  By: Kem Parkinson      Past Surgical History:  Procedure Laterality Date   COLONOSCOPY     TONSILLECTOMY      Social History:  reports that he quit smoking about 38 years ago. His smoking use included cigarettes. He has never used smokeless tobacco. He reports that he does not currently use alcohol. He reports that he does not use drugs.  Allergies: Allergies  Allergen Reactions   Sulfonamide Derivatives     Family History:  Family History  Problem Relation Age of Onset   Pancreatic cancer Father    Colon polyps Father        Benign   Colon cancer Neg Hx    Diabetes Neg Hx    Kidney disease Neg Hx    Gallbladder disease Neg Hx    Esophageal cancer Neg Hx    Rectal cancer Neg Hx    Stomach cancer Neg Hx      Current  Outpatient Medications:    meloxicam (MOBIC) 7.5 MG tablet, Take 1 tablet (7.5 mg total) by mouth daily., Disp: 30 tablet, Rfl: 0   azelastine (ASTELIN) 0.1 % nasal spray, Place 1 spray into both nostrils 2 (two) times daily. Use in each nostril as directed, Disp: , Rfl:    desloratadine (CLARINEX) 5 MG tablet, Take 5 mg by mouth daily., Disp: , Rfl:    doxycycline (VIBRAMYCIN) 100 MG capsule, Take 1 capsule (100 mg total) by mouth 2 (two) times daily., Disp: 14 capsule, Rfl: 0   fluticasone (FLONASE) 50 MCG/ACT nasal spray, SPRAY 2 SPRAYS INTO EACH NOSTRIL EVERY DAY, Disp: 48 mL, Rfl: 2   hydrocortisone (ANUSOL-HC) 25 MG suppository, UNWRAP AND INSERT ONE SUPPOSITORY RECTALLY TWICE DAILY, Disp: 12 suppository, Rfl: 0   metoprolol succinate (TOPROL-XL) 25 MG 24 hr tablet, TAKE 1 TABLET BY MOUTH EVERY DAY, Disp: 90 tablet, Rfl: 3   montelukast (SINGULAIR) 10 MG tablet, Take 10 mg by mouth daily., Disp: , Rfl:    Multiple Vitamin (MULTIVITAMIN) tablet, Take 1 tablet by mouth daily., Disp: , Rfl:    predniSONE (STERAPRED UNI-PAK 21 TAB) 10 MG (21) TBPK tablet, Take as directed (Patient not taking: Reported on 09/25/2022), Disp: 21  tablet, Rfl: 0   RESVERATROL PO, Take 325 each by mouth., Disp: , Rfl:   Review of Systems:  Negative unless indicated in HPI.   Physical Exam: Vitals:   11/19/22 1147  BP: 122/82  Pulse: (!) 53  Temp: 98.4 F (36.9 C)  TempSrc: Oral  SpO2: 99%  Weight: 200 lb 11.2 oz (91 kg)    Body mass index is 28.8 kg/m.   Physical Exam Musculoskeletal:     Comments: No effusion or sign of left knee instability.      Impression and Plan:  Acute pain of left knee -     Meloxicam; Take 1 tablet (7.5 mg total) by mouth daily.  Dispense: 30 tablet; Refill: 0  -There are no signs of knee instability on exam, no effusion.  Unsure of mechanism of injury but have suggested bracing and a 10-day course of meloxicam.  If still painful will refer to orthopedics for further  evaluation.   Time spent:31 minutes reviewing chart, interviewing and examining patient and formulating plan of care.     Chaya Jan, MD [facility]

## 2023-01-20 ENCOUNTER — Ambulatory Visit (INDEPENDENT_AMBULATORY_CARE_PROVIDER_SITE_OTHER): Payer: Medicare Other | Admitting: Otolaryngology

## 2023-01-20 ENCOUNTER — Encounter (INDEPENDENT_AMBULATORY_CARE_PROVIDER_SITE_OTHER): Payer: Self-pay

## 2023-01-20 VITALS — BP 131/85 | HR 66 | Ht 70.0 in | Wt 193.0 lb

## 2023-01-20 DIAGNOSIS — R0981 Nasal congestion: Secondary | ICD-10-CM

## 2023-01-20 DIAGNOSIS — J310 Chronic rhinitis: Secondary | ICD-10-CM | POA: Diagnosis not present

## 2023-01-20 DIAGNOSIS — J343 Hypertrophy of nasal turbinates: Secondary | ICD-10-CM | POA: Diagnosis not present

## 2023-01-20 DIAGNOSIS — J342 Deviated nasal septum: Secondary | ICD-10-CM | POA: Diagnosis not present

## 2023-01-21 DIAGNOSIS — J342 Deviated nasal septum: Secondary | ICD-10-CM | POA: Insufficient documentation

## 2023-01-21 DIAGNOSIS — J343 Hypertrophy of nasal turbinates: Secondary | ICD-10-CM | POA: Insufficient documentation

## 2023-01-21 DIAGNOSIS — J310 Chronic rhinitis: Secondary | ICD-10-CM | POA: Insufficient documentation

## 2023-01-21 NOTE — Progress Notes (Signed)
 Patient ID: George [name], male   DOB: 1950-12-11, 73 y.o.   MRN: [id number]  CC: Chronic nasal obstruction  HPI:  George [name] is a 73 y.o. male who presents today complaining of chronic nasal obstruction for more than 2 years.  The severity of his nasal obstruction has increased over the past 6 months.  His nasal obstruction is worse at night.  It has resulted in chronic mouth breathing and frequent waking up at night.  He also complains of frequent sore throat as a result.  The patient has a history of environmental allergies.  He is currently seeing his allergist Dr. Brian Shin.  He is currently on Flonase , azelastine , Singulair , and Claritin daily.  He has been using his allergy medications for more than 2 years.  Despite the treatment, he continues to be symptomatic.  Currently he denies any facial pain, fever, or visual change.  He has no history of facial or nasal trauma.  He underwent adenotonsillectomy as a child.  He has no other ENT surgery.  Past Medical History:  Diagnosis Date   Allergy    Colon polyp    Mucosal prolapse polyp   Diverticulosis    Essential hypertension 09/22/2008   Qualifier: Diagnosis of  By: Gwenn Grimes     External hemorrhoids    HOCM (hypertrophic obstructive cardiomyopathy) (HCC)    Hypertension    Hypertrophic obstructive cardiomyopathy (HCC) 09/24/2008   Qualifier: Diagnosis of  By: Pietro, MD, CODY Redell Dimes    Irregular heart beat    VASOMOTOR RHINITIS 12/24/2008   Qualifier: Diagnosis of  By: Jame  MD, Maude FALCON    VENTRICULAR HYPERTROPHY, LEFT 09/22/2008   Qualifier: Diagnosis of  By: Gwenn Grimes      Past Surgical History:  Procedure Laterality Date   COLONOSCOPY     TONSILLECTOMY      Family History  Problem Relation Age of Onset   Pancreatic cancer Father    Colon polyps Father        Benign   Colon cancer Neg Hx    Diabetes Neg Hx    Kidney disease Neg Hx    Gallbladder disease Neg Hx    Esophageal cancer Neg  Hx    Rectal cancer Neg Hx    Stomach cancer Neg Hx     Social History:  reports that he quit smoking about 38 years ago. His smoking use included cigarettes. He has never used smokeless tobacco. He reports that he does not currently use alcohol. He reports that he does not use drugs.  Allergies:  Allergies  Allergen Reactions   Sulfonamide Derivatives Anaphylaxis    Prior to Admission medications   Medication Sig Start Date End Date Taking? Authorizing Provider  azelastine  (ASTELIN ) 0.1 % nasal spray Place 1 spray into both nostrils 2 (two) times daily. Use in each nostril as directed   Yes [provider]  desloratadine (CLARINEX) 5 MG tablet Take 5 mg by mouth daily.   Yes [provider]  doxycycline  (VIBRAMYCIN ) 100 MG capsule Take 1 capsule (100 mg total) by mouth 2 (two) times daily. 12/11/21  Yes Nafziger, Darleene, NP  fluticasone  (FLONASE ) 50 MCG/ACT nasal spray SPRAY 2 SPRAYS INTO EACH NOSTRIL EVERY DAY 08/12/20  Yes Theophilus Andrews, Tully GRADE, MD  levocetirizine (XYZAL ) 5 MG tablet Take 5 mg by mouth every evening.   Yes [provider]  metoprolol  succinate (TOPROL -XL) 25 MG 24 hr tablet TAKE 1 TABLET BY MOUTH EVERY DAY 11/17/22  Yes Pietro Redell RAMAN, MD  montelukast  (SINGULAIR ) 10 MG tablet Take 10 mg by mouth daily. 12/04/19  Yes [provider]  Multiple Vitamin (MULTIVITAMIN) tablet Take 1 tablet by mouth daily.   Yes [provider]  RESVERATROL PO Take 325 each by mouth.   Yes [provider]  hydrocortisone  (ANUSOL -HC) 25 MG suppository UNWRAP AND INSERT ONE SUPPOSITORY RECTALLY TWICE DAILY Patient not taking: Reported on 01/20/2023 09/21/22   Theophilus Andrews, Tully GRADE, MD  meloxicam  (MOBIC ) 7.5 MG tablet Take 1 tablet (7.5 mg total) by mouth daily. Patient not taking: Reported on 01/20/2023 11/19/22   Theophilus Andrews, Tully GRADE, MD  predniSONE  (STERAPRED UNI-PAK 21 TAB) 10 MG (21) TBPK tablet Take as directed Patient not  taking: Reported on 09/25/2022 06/11/22   Theophilus Andrews, Tully GRADE, MD    Blood pressure 131/85, pulse 66, height 5' 10 (1.778 m), weight 193 lb (87.5 kg), SpO2 96%. Exam: General: Communicates without difficulty, well nourished, no acute distress. Head: Normocephalic, no evidence injury, no tenderness, facial buttresses intact without stepoff. Face/sinus: No tenderness to palpation and percussion. Facial movement is normal and symmetric. Eyes: PERRL, EOMI. No scleral icterus, conjunctivae clear. Neuro: CN II exam reveals vision grossly intact.  No nystagmus at any point of gaze. Ears: Auricles well formed without lesions.  Ear canals are intact without mass or lesion.  No erythema or edema is appreciated.  The TMs are intact without fluid. Nose: External evaluation reveals normal support and skin without lesions.  Dorsum is intact.  Anterior rhinoscopy reveals congested mucosa over anterior aspect of inferior turbinates and intact septum.  No purulence noted. Oral:  Oral cavity and oropharynx are intact, symmetric, without erythema or edema.  Mucosa is moist without lesions. Neck: Full range of motion without pain.  There is no significant lymphadenopathy.  No masses palpable.  Thyroid bed within normal limits to palpation.  Parotid glands and submandibular glands equal bilaterally without mass.  Trachea is midline. Neuro:  CN 2-12 grossly intact.   Procedure:  Flexible Nasal Endoscopy: Description: Risks, benefits, and alternatives of flexible endoscopy were explained to the patient.  Specific mention was made of the risk of throat numbness with difficulty swallowing, possible bleeding from the nose and mouth, and pain from the procedure.  The patient gave oral consent to proceed.  The flexible scope was inserted into the right nasal cavity.  Endoscopy of the interior nasal cavity, superior, inferior, and middle meatus was performed. The sphenoid-ethmoid recess was examined. Edematous mucosa was noted.  No  polyp, mass, or lesion was appreciated. Nasal septal deviation noted. Olfactory cleft was clear.  Nasopharynx was clear.  Turbinates were hypertrophied but without mass.  The procedure was repeated on the contralateral side with similar findings.  The patient tolerated the procedure well.   Assessment: 1.  Chronic rhinitis with nasal mucosal congestion, nasal septal deviation, and bilateral inferior turbinate hypertrophy. 2.  More than 95% of his nasal passageways are obstructed bilaterally. 3.  The patient has not responded to maximal medical therapy for the past 2 years.  Plan: 1.  The physical exam and nasal endoscopy findings are reviewed with the patient. 2.  Continue with his current allergy treatment regimen of Flonase , azelastine , Singulair , and Claritin. 3.  In light of his persistent symptoms, he will benefit from surgical intervention with septoplasty and bilateral turbinate reduction.  The risk, benefits, and details of the procedures are reviewed.  Questions are invited and answered. 4.  The patient would  like to proceed with the procedures.  Modena Bellemare W Alvira Hecht 01/21/2023, 10:26 AM

## 2023-01-26 ENCOUNTER — Encounter: Payer: Self-pay | Admitting: Internal Medicine

## 2023-01-26 ENCOUNTER — Ambulatory Visit (INDEPENDENT_AMBULATORY_CARE_PROVIDER_SITE_OTHER): Payer: Medicare Other | Admitting: Internal Medicine

## 2023-01-26 VITALS — BP 124/84 | HR 67 | Temp 98.6°F | Wt 197.2 lb

## 2023-01-26 DIAGNOSIS — J342 Deviated nasal septum: Secondary | ICD-10-CM

## 2023-01-26 DIAGNOSIS — J302 Other seasonal allergic rhinitis: Secondary | ICD-10-CM

## 2023-01-26 DIAGNOSIS — J343 Hypertrophy of nasal turbinates: Secondary | ICD-10-CM

## 2023-01-26 NOTE — Progress Notes (Signed)
 Established Patient Office Visit     CC/Reason for Visit: URI symptoms  HPI: George [name] is a 73 y.o. male who is coming in today for the above mentioned reasons. Past Medical History is significant for: Chronic allergic rhinitis and eustachian tube dysfunction.  He has seen ENT and will be having nasal turbinate surgery done in the upcoming weeks.  He recently had a URI which made his symptoms worse.  He is already on maximal medical therapy.   Past Medical/Surgical History: Past Medical History:  Diagnosis Date   Allergy    Colon polyp    Mucosal prolapse polyp   Diverticulosis    Essential hypertension 09/22/2008   Qualifier: Diagnosis of  By: Gwenn Grimes     External hemorrhoids    HOCM (hypertrophic obstructive cardiomyopathy) (HCC)    Hypertension    Hypertrophic obstructive cardiomyopathy (HCC) 09/24/2008   Qualifier: Diagnosis of  By: Pietro, MD, CODY Redell Dimes    Irregular heart beat    VASOMOTOR RHINITIS 12/24/2008   Qualifier: Diagnosis of  By: Jame  MD, Maude FALCON    VENTRICULAR HYPERTROPHY, LEFT 09/22/2008   Qualifier: Diagnosis of  By: Gwenn Grimes      Past Surgical History:  Procedure Laterality Date   COLONOSCOPY     TONSILLECTOMY      Social History:  reports that he quit smoking about 38 years ago. His smoking use included cigarettes. He has never used smokeless tobacco. He reports that he does not currently use alcohol. He reports that he does not use drugs.  Allergies: Allergies  Allergen Reactions   Sulfonamide Derivatives Anaphylaxis    Family History:  Family History  Problem Relation Age of Onset   Pancreatic cancer Father    Colon polyps Father        Benign   Colon cancer Neg Hx    Diabetes Neg Hx    Kidney disease Neg Hx    Gallbladder disease Neg Hx    Esophageal cancer Neg Hx    Rectal cancer Neg Hx    Stomach cancer Neg Hx      Current Outpatient Medications:    azelastine  (ASTELIN ) 0.1 % nasal  spray, Place 1 spray into both nostrils 2 (two) times daily. Use in each nostril as directed, Disp: , Rfl:    desloratadine (CLARINEX) 5 MG tablet, Take 5 mg by mouth daily., Disp: , Rfl:    doxycycline  (VIBRAMYCIN ) 100 MG capsule, Take 1 capsule (100 mg total) by mouth 2 (two) times daily., Disp: 14 capsule, Rfl: 0   fluticasone  (FLONASE ) 50 MCG/ACT nasal spray, SPRAY 2 SPRAYS INTO EACH NOSTRIL EVERY DAY, Disp: 48 mL, Rfl: 2   hydrocortisone  (ANUSOL -HC) 25 MG suppository, UNWRAP AND INSERT ONE SUPPOSITORY RECTALLY TWICE DAILY, Disp: 12 suppository, Rfl: 0   metoprolol  succinate (TOPROL -XL) 25 MG 24 hr tablet, TAKE 1 TABLET BY MOUTH EVERY DAY, Disp: 90 tablet, Rfl: 3   montelukast  (SINGULAIR ) 10 MG tablet, Take 10 mg by mouth daily., Disp: , Rfl:    Multiple Vitamin (MULTIVITAMIN) tablet, Take 1 tablet by mouth daily., Disp: , Rfl:    RESVERATROL PO, Take 325 each by mouth., Disp: , Rfl:   Review of Systems:  Negative unless indicated in HPI.   Physical Exam: Vitals:   01/26/23 1523  BP: 124/84  Pulse: 67  Temp: 98.6 F (37 C)  TempSrc: Oral  SpO2: 99%  Weight: 197 lb 3.2 oz (89.4 kg)    Body mass  index is 28.3 kg/m.   Physical Exam Vitals reviewed.  Constitutional:      Appearance: Normal appearance.  HENT:     Right Ear: Tympanic membrane, ear canal and external ear normal.     Left Ear: Tympanic membrane, ear canal and external ear normal.     Mouth/Throat:     Mouth: Mucous membranes are moist.     Pharynx: Oropharynx is clear.  Eyes:     Conjunctiva/sclera: Conjunctivae normal.  Cardiovascular:     Rate and Rhythm: Normal rate and regular rhythm.  Pulmonary:     Effort: Pulmonary effort is normal.     Breath sounds: Normal breath sounds.  Neurological:     Mental Status: He is alert.      Impression and Plan:  Seasonal allergic rhinitis, unspecified trigger  Deviated nasal septum  Hypertrophy of nasal turbinates  -I believe his recent URI made his  chronic rhinitis and eustachian tube dysfunction worse.  He has already seen Dr. Karis and has plans for surgery coming up soon.   Time spent:22 minutes reviewing chart, interviewing and examining patient and formulating plan of care.     Tully Theophilus Andrews, MD [facility]

## 2023-02-15 ENCOUNTER — Encounter (INDEPENDENT_AMBULATORY_CARE_PROVIDER_SITE_OTHER): Payer: Medicare Other

## 2023-03-18 ENCOUNTER — Encounter (HOSPITAL_COMMUNITY): Payer: Self-pay | Admitting: Otolaryngology

## 2023-03-18 ENCOUNTER — Other Ambulatory Visit: Payer: Self-pay

## 2023-03-18 NOTE — Anesthesia Preprocedure Evaluation (Addendum)
 Anesthesia Evaluation  Patient identified by MRN, date of birth, ID band Patient awake    Reviewed: Allergy & Precautions, H&P , NPO status , Patient's Chart, lab work & pertinent test results  Airway Mallampati: III  TM Distance: >3 FB Neck ROM: Full    Dental no notable dental hx. (+) Teeth Intact, Dental Advisory Given   Pulmonary former smoker   Pulmonary exam normal breath sounds clear to auscultation       Cardiovascular hypertension, Pt. on medications and Pt. on home beta blockers  Rhythm:Regular Rate:Normal     Neuro/Psych negative neurological ROS  negative psych ROS   GI/Hepatic negative GI ROS, Neg liver ROS,,,  Endo/Other  negative endocrine ROS    Renal/GU negative Renal ROS  negative genitourinary   Musculoskeletal   Abdominal   Peds  Hematology negative hematology ROS (+)   Anesthesia Other Findings   Reproductive/Obstetrics negative OB ROS                             Anesthesia Physical Anesthesia Plan  ASA: 3  Anesthesia Plan: General   Post-op Pain Management: Tylenol PO (pre-op)*   Induction: Intravenous  PONV Risk Score and Plan: 3 and Ondansetron, Dexamethasone and Treatment may vary due to age or medical condition  Airway Management Planned: Oral ETT  Additional Equipment: ClearSight  Intra-op Plan:   Post-operative Plan: Extubation in OR  Informed Consent: I have reviewed the patients History and Physical, chart, labs and discussed the procedure including the risks, benefits and alternatives for the proposed anesthesia with the patient or authorized representative who has indicated his/her understanding and acceptance.     Dental advisory given  Plan Discussed with: CRNA  Anesthesia Plan Comments: (PAT note by Antionette Poles, PA-C: 73 year old male follows with cardiology for history of hypertrophic obstructive cardiomyopathy.  A previous Myoview in  July 2008 showed no ischemia or infarction. Echocardiogram October 2022 showed normal LV function, severe asymmetric septal hypertrophy, grade 1 diastolic dysfunction, systolic anterior motion of the mitral valve, mild to moderate left atrial enlargement; findings felt consistent with hypertrophic cardiomyopathy.  Monitor August 2023 showed sinus rhythm with 10 beats of PAT, PVCs but no nonsustained ventricular tachycardia.  Exercise treadmill August 2023 showed normal systolic blood pressure response.  Cardiac MRI October 2023 showed severe asymmetric septal hypertrophy (21 mm in the basal septum and 14 mm in the posterior wall), findings consistent with hypertrophic cardiomyopathy, less than 1% LGE the myocardial mass uptake, possible left upper lobe pulmonary nodule and CT recommended.  Follow-up chest CT November 2023 showed 4 mm nodule in the left major fissure and follow-up recommended 12 months (follow-up 11/2022 showed stable nodule indicative of benign subpleural lymph node).  Patient was last seen by Dr. [name] on 09/25/2022 and noted to be doing well at that time, blood pressure well-controlled.  Abdominal ultrasound was ordered to evaluate bruit and was negative for AAA.  Follow-up echocardiogram was also ordered, done 10/08/2022, showed EF >75%, severe concentric LVH, grade 1 DD, normal RV function, SAM present with LVOT peak gradient 21 mmHg (prior 27 mmHg), no significant valve abnormalities.  1 year follow-up was recommended.  Patient will need day of surgery labs and evaluation.  EKG 09/25/2022: Sinus bradycardia.  Rate 57. Left ventricular hypertrophy with repolarization abnormality  TTE 10/08/2022: 1. Left ventricular ejection fraction, by estimation, is >75%. The left  ventricle has hyperdynamic function. The left ventricle has no regional  wall motion abnormalities. There is severe concentric left ventricular  hypertrophy. Left ventricular  diastolic parameters are consistent with Grade  I diastolic dysfunction  (impaired relaxation).  2. Right ventricular systolic function is normal. The right ventricular  size is normal. There is normal pulmonary artery systolic pressure. The  estimated right ventricular systolic pressure is 25.5 mmHg.  3. Left atrial size was moderately dilated.  4. SAM present. LVOT peak gradient 21 mmHg (prior 27 mmHg). The mitral  valve is normal in structure. Mild mitral valve regurgitation. No evidence  of mitral stenosis. Moderate mitral annular calcification.  5. The aortic valve is normal in structure. Aortic valve regurgitation is  not visualized. No aortic stenosis is present.  6. The inferior vena cava is normal in size with greater than 50%  respiratory variability, suggesting right atrial pressure of 3 mmHg.   Comparison(s): Prior images reviewed side by side.   Conclusion(s)/Recommendation(s): Findings consistent with hypertrophic  cardiomyopathy.   Cardiac MRI 10/29/2021: IMPRESSION: 1. Severe asymmetric LV hypertrophy measuring 21mm in basal septum (14mm in posterior wall), consistent with hypertrophic cardiomyopathy. There is LVOT obstruction due to systolic anterior motion of the anterior mitral valve leaflet.  2. RV insertion site late gadolinium enhancement, which is commonly seen in HCM. LGE accounts for <1% of total myocardial mass  3.  Normal LV size and systolic function (EF 55%)  4.  Normal RV size and systolic function (EF 69%)  5. Possible LUL pulmonary nodule, not well visualized, consider noncontrast chest CT for further evaluation  Exercise tolerance test 09/03/2021: No ST deviation was noted.  Good functional status and no exercise induced arrhythmia. Otherwise uninterpretable study due to LBBB.  )        Anesthesia Quick Evaluation

## 2023-03-18 NOTE — Progress Notes (Signed)
 Anesthesia Chart Review: Same day workup  73 year old male follows with cardiology for history of hypertrophic obstructive cardiomyopathy.  A previous Myoview in July 2008 showed no ischemia or infarction. Echocardiogram October 2022 showed normal LV function, severe asymmetric septal hypertrophy, grade 1 diastolic dysfunction, systolic anterior motion of the mitral valve, mild to moderate left atrial enlargement; findings felt consistent with hypertrophic cardiomyopathy.  Monitor August 2023 showed sinus rhythm with 10 beats of PAT, PVCs but no nonsustained ventricular tachycardia.  Exercise treadmill August 2023 showed normal systolic blood pressure response.  Cardiac MRI October 2023 showed severe asymmetric septal hypertrophy (21 mm in the basal septum and 14 mm in the posterior wall), findings consistent with hypertrophic cardiomyopathy, less than 1% LGE the myocardial mass uptake, possible left upper lobe pulmonary nodule and CT recommended.  Follow-up chest CT November 2023 showed 4 mm nodule in the left major fissure and follow-up recommended 12 months (follow-up 11/2022 showed stable nodule indicative of benign subpleural lymph node).  Patient was last seen by Dr. [name] on 09/25/2022 and noted to be doing well at that time, blood pressure well-controlled.  Abdominal ultrasound was ordered to evaluate bruit and was negative for AAA.  Follow-up echocardiogram was also ordered, done 10/08/2022, showed EF >75%, severe concentric LVH, grade 1 DD, normal RV function, SAM present with LVOT peak gradient 21 mmHg (prior 27 mmHg), no significant valve abnormalities.  1 year follow-up was recommended.  Patient will need day of surgery labs and evaluation.  EKG 09/25/2022: Sinus bradycardia.  Rate 57. Left ventricular hypertrophy with repolarization abnormality  TTE 10/08/2022: 1. Left ventricular ejection fraction, by estimation, is >75%. The left  ventricle has hyperdynamic function. The left ventricle has  no regional  wall motion abnormalities. There is severe concentric left ventricular  hypertrophy. Left ventricular  diastolic parameters are consistent with Grade I diastolic dysfunction  (impaired relaxation).   2. Right ventricular systolic function is normal. The right ventricular  size is normal. There is normal pulmonary artery systolic pressure. The  estimated right ventricular systolic pressure is 25.5 mmHg.   3. Left atrial size was moderately dilated.   4. SAM present. LVOT peak gradient 21 mmHg (prior 27 mmHg). The mitral  valve is normal in structure. Mild mitral valve regurgitation. No evidence  of mitral stenosis. Moderate mitral annular calcification.   5. The aortic valve is normal in structure. Aortic valve regurgitation is  not visualized. No aortic stenosis is present.   6. The inferior vena cava is normal in size with greater than 50%  respiratory variability, suggesting right atrial pressure of 3 mmHg.   Comparison(s): Prior images reviewed side by side.   Conclusion(s)/Recommendation(s): Findings consistent with hypertrophic  cardiomyopathy.   Cardiac MRI 10/29/2021: IMPRESSION: 1. Severe asymmetric LV hypertrophy measuring 21mm in basal septum (14mm in posterior wall), consistent with hypertrophic cardiomyopathy. There is LVOT obstruction due to systolic anterior motion of the anterior mitral valve leaflet.   2. RV insertion site late gadolinium enhancement, which is commonly seen in HCM. LGE accounts for <1% of total myocardial mass   3.  Normal LV size and systolic function (EF 55%)   4.  Normal RV size and systolic function (EF 69%)   5. Possible LUL pulmonary nodule, not well visualized, consider noncontrast chest CT for further evaluation  Exercise tolerance test 09/03/2021: No ST deviation was noted.   Good functional status and no exercise induced arrhythmia. Otherwise uninterpretable study due to LBBB.    Antionette Poles, PA-C [facility]  Short Stay  Center/Anesthesiology Phone [phone] 03/18/2023 9:49 AM

## 2023-03-18 NOTE — Progress Notes (Signed)
 SDW CALL  Patient was given pre-op instructions over the phone. The opportunity was given for the patient to ask questions. No further questions asked. Patient verbalized understanding of instructions given.   PCP - Dr. Elizabeth Palau Cardiologist - Dr. Olga Millers  PPM/ICD - denies Device Orders - n/a Rep Notified - n/a  Chest x-ray - 11/17/22 EKG - 09/25/22 Stress Test - 09/03/21 ECHO - 10/08/22 Cardiac Cath - denies  Sleep Study - denies CPAP - n/a  No DM  Last dose of GLP1 agonist-  n/a GLP1 instructions: n/a  Blood Thinner Instructions: n/a Aspirin Instructions: n/a  ERAS Protcol - clears until 1000 PRE-SURGERY Ensure or G2- n/a  COVID TEST- no   Anesthesia review: yes - cardiac history  Patient denies shortness of breath, fever, cough and chest pain over the phone call   All instructions explained to the patient, with a verbal understanding of the material. Patient agrees to go over the instructions while at home for a better understanding.

## 2023-03-19 ENCOUNTER — Ambulatory Visit (HOSPITAL_COMMUNITY)
Admission: RE | Admit: 2023-03-19 | Discharge: 2023-03-19 | Disposition: A | Discharge status: Home / Self Care | Payer: Medicare Other | Attending: Otolaryngology | Admitting: Otolaryngology

## 2023-03-19 ENCOUNTER — Ambulatory Visit (HOSPITAL_BASED_OUTPATIENT_CLINIC_OR_DEPARTMENT_OTHER): Payer: Self-pay | Admitting: Physician Assistant

## 2023-03-19 ENCOUNTER — Other Ambulatory Visit: Payer: Self-pay

## 2023-03-19 ENCOUNTER — Ambulatory Visit (HOSPITAL_COMMUNITY): Payer: Self-pay | Admitting: Physician Assistant

## 2023-03-19 ENCOUNTER — Telehealth (INDEPENDENT_AMBULATORY_CARE_PROVIDER_SITE_OTHER): Payer: Self-pay | Admitting: Otolaryngology

## 2023-03-19 ENCOUNTER — Encounter (HOSPITAL_COMMUNITY)
Admission: RE | Disposition: A | Discharge status: Home / Self Care | Payer: Self-pay | Source: Home / Self Care | Attending: Otolaryngology

## 2023-03-19 ENCOUNTER — Encounter (INDEPENDENT_AMBULATORY_CARE_PROVIDER_SITE_OTHER): Payer: Medicare Other

## 2023-03-19 ENCOUNTER — Encounter (HOSPITAL_COMMUNITY): Payer: Self-pay | Admitting: Otolaryngology

## 2023-03-19 DIAGNOSIS — I10 Essential (primary) hypertension: Secondary | ICD-10-CM | POA: Diagnosis not present

## 2023-03-19 DIAGNOSIS — J310 Chronic rhinitis: Secondary | ICD-10-CM | POA: Diagnosis not present

## 2023-03-19 DIAGNOSIS — Z87891 Personal history of nicotine dependence: Secondary | ICD-10-CM | POA: Insufficient documentation

## 2023-03-19 DIAGNOSIS — J3489 Other specified disorders of nose and nasal sinuses: Secondary | ICD-10-CM | POA: Insufficient documentation

## 2023-03-19 DIAGNOSIS — J342 Deviated nasal septum: Secondary | ICD-10-CM

## 2023-03-19 DIAGNOSIS — I421 Obstructive hypertrophic cardiomyopathy: Secondary | ICD-10-CM | POA: Diagnosis not present

## 2023-03-19 DIAGNOSIS — Z79899 Other long term (current) drug therapy: Secondary | ICD-10-CM | POA: Insufficient documentation

## 2023-03-19 DIAGNOSIS — I447 Left bundle-branch block, unspecified: Secondary | ICD-10-CM | POA: Insufficient documentation

## 2023-03-19 DIAGNOSIS — J343 Hypertrophy of nasal turbinates: Secondary | ICD-10-CM

## 2023-03-19 DIAGNOSIS — Z9089 Acquired absence of other organs: Secondary | ICD-10-CM | POA: Insufficient documentation

## 2023-03-19 HISTORY — PX: NASAL SEPTOPLASTY W/ TURBINOPLASTY: SHX2070

## 2023-03-19 LAB — CBC
HCT: 42.4 % (ref 39.0–52.0)
Hemoglobin: 14.6 g/dL (ref 13.0–17.0)
MCH: 28.9 pg (ref 26.0–34.0)
MCHC: 34.4 g/dL (ref 30.0–36.0)
MCV: 84 fL (ref 80.0–100.0)
Platelets: 177 10*3/uL (ref 150–400)
RBC: 5.05 MIL/uL (ref 4.22–5.81)
RDW: 13.5 % (ref 11.5–15.5)
WBC: 6.9 10*3/uL (ref 4.0–10.5)
nRBC: 0 % (ref 0.0–0.2)

## 2023-03-19 LAB — BASIC METABOLIC PANEL
Anion gap: 11 (ref 5–15)
BUN: 21 mg/dL (ref 8–23)
CO2: 21 mmol/L — ABNORMAL LOW (ref 22–32)
Calcium: 9.3 mg/dL (ref 8.9–10.3)
Chloride: 104 mmol/L (ref 98–111)
Creatinine, Ser: 0.99 mg/dL (ref 0.61–1.24)
GFR, Estimated: >60 mL/min (ref >60)
Glucose, Bld: 104 mg/dL — ABNORMAL HIGH (ref 70–99)
Potassium: 3.8 mmol/L (ref 3.5–5.1)
Sodium: 136 mmol/L (ref 135–145)

## 2023-03-19 SURGERY — SEPTOPLASTY, NOSE, WITH NASAL TURBINATE REDUCTION
Anesthesia: General | Site: Nose

## 2023-03-19 MED ORDER — SUGAMMADEX SODIUM 200 MG/2ML IV SOLN
INTRAVENOUS | Status: DC | PRN
  Administered 2023-03-19: 200 mg via INTRAVENOUS

## 2023-03-19 MED ORDER — CEFAZOLIN SODIUM-DEXTROSE 2-3 GM-%(50ML) IV SOLR
INTRAVENOUS | Status: DC | PRN
  Administered 2023-03-19: 2 g via INTRAVENOUS

## 2023-03-19 MED ORDER — ACETAMINOPHEN 500 MG PO TABS: 1000.0000 mg | ORAL_TABLET | Freq: Once | ORAL | Status: AC

## 2023-03-19 MED ORDER — ONDANSETRON HCL 4 MG/2ML IJ SOLN
INTRAMUSCULAR | Status: DC | PRN
  Administered 2023-03-19: 4 mg via INTRAVENOUS

## 2023-03-19 MED ORDER — CHLORHEXIDINE GLUCONATE 0.12 % MT SOLN: 15.0000 mL | Freq: Once | OROMUCOSAL | Status: AC

## 2023-03-19 MED ORDER — ESMOLOL HCL 100 MG/10ML IV SOLN
INTRAVENOUS | Status: DC | PRN
  Administered 2023-03-19 (×3): 30 mg via INTRAVENOUS

## 2023-03-19 MED ORDER — ROCURONIUM BROMIDE 10 MG/ML (PF) SYRINGE
PREFILLED_SYRINGE | INTRAVENOUS | Status: DC | PRN
Start: 2023-03-19 — End: 2023-03-19
  Administered 2023-03-19: 50 mg via INTRAVENOUS

## 2023-03-19 MED ORDER — FENTANYL CITRATE (PF) 100 MCG/2ML IJ SOLN
INTRAMUSCULAR | Status: AC
  Filled 2023-03-19: qty 2

## 2023-03-19 MED ORDER — HYDRALAZINE HCL 20 MG/ML IJ SOLN
INTRAMUSCULAR | Status: AC
  Filled 2023-03-19: qty 1

## 2023-03-19 MED ORDER — LIDOCAINE-EPINEPHRINE 1 %-1:100000 IJ SOLN
INTRAMUSCULAR | Status: AC
  Filled 2023-03-19: qty 1

## 2023-03-19 MED ORDER — LABETALOL HCL 5 MG/ML IV SOLN
INTRAVENOUS | Status: DC | PRN
  Administered 2023-03-19: 10 mg via INTRAVENOUS

## 2023-03-19 MED ORDER — FENTANYL CITRATE (PF) 250 MCG/5ML IJ SOLN
INTRAMUSCULAR | Status: DC | PRN
  Administered 2023-03-19 (×2): 50 ug via INTRAVENOUS

## 2023-03-19 MED ORDER — ACETAMINOPHEN 500 MG PO TABS
ORAL_TABLET | ORAL | Status: AC
Start: 2023-03-19 — End: 2023-03-19
  Administered 2023-03-19: 1000 mg via ORAL
  Filled 2023-03-19: qty 2

## 2023-03-19 MED ORDER — BACITRACIN ZINC 500 UNIT/GM EX OINT
TOPICAL_OINTMENT | CUTANEOUS | Status: DC | PRN
  Administered 2023-03-19: 1 via TOPICAL

## 2023-03-19 MED ORDER — PHENYLEPHRINE HCL-NACL 20-0.9 MG/250ML-% IV SOLN
INTRAVENOUS | Status: DC | PRN
  Administered 2023-03-19: 30 ug/min via INTRAVENOUS

## 2023-03-19 MED ORDER — LIDOCAINE-EPINEPHRINE 1 %-1:100000 IJ SOLN
INTRAMUSCULAR | Status: DC | PRN
  Administered 2023-03-19: 3 mL

## 2023-03-19 MED ORDER — DEXMEDETOMIDINE HCL IN NACL 80 MCG/20ML IV SOLN
INTRAVENOUS | Status: DC | PRN
  Administered 2023-03-19: 4 ug via INTRAVENOUS

## 2023-03-19 MED ORDER — CHLORHEXIDINE GLUCONATE 0.12 % MT SOLN
OROMUCOSAL | Status: AC
  Administered 2023-03-19: 15 mL via OROMUCOSAL
  Filled 2023-03-19: qty 15

## 2023-03-19 MED ORDER — ORAL CARE MOUTH RINSE: 15.0000 mL | Freq: Once | OROMUCOSAL | Status: AC

## 2023-03-19 MED ORDER — BACITRACIN ZINC 500 UNIT/GM EX OINT
TOPICAL_OINTMENT | CUTANEOUS | Status: AC
  Filled 2023-03-19: qty 28.35

## 2023-03-19 MED ORDER — FENTANYL CITRATE (PF) 100 MCG/2ML IJ SOLN
25.0000 ug | INTRAMUSCULAR | Status: DC | PRN
  Administered 2023-03-19 (×2): 50 ug via INTRAVENOUS

## 2023-03-19 MED ORDER — OXYMETAZOLINE HCL 0.05 % NA SOLN
NASAL | Status: AC
  Filled 2023-03-19: qty 30

## 2023-03-19 MED ORDER — LACTATED RINGERS IV SOLN: INTRAVENOUS | Status: DC

## 2023-03-19 MED ORDER — HYDRALAZINE HCL 20 MG/ML IJ SOLN
5.0000 mg | Freq: Once | INTRAMUSCULAR | Status: AC
  Administered 2023-03-19: 5 mg via INTRAVENOUS

## 2023-03-19 MED ORDER — FENTANYL CITRATE (PF) 250 MCG/5ML IJ SOLN
INTRAMUSCULAR | Status: AC
  Filled 2023-03-19: qty 5

## 2023-03-19 MED ORDER — 0.9 % SODIUM CHLORIDE (POUR BTL) OPTIME
TOPICAL | Status: DC | PRN
Start: 2023-03-19 — End: 2023-03-19
  Administered 2023-03-19: 1000 mL

## 2023-03-19 MED ORDER — PHENYLEPHRINE 80 MCG/ML (10ML) SYRINGE FOR IV PUSH (FOR BLOOD PRESSURE SUPPORT)
PREFILLED_SYRINGE | INTRAVENOUS | Status: DC | PRN
  Administered 2023-03-19: 80 ug via INTRAVENOUS

## 2023-03-19 MED ORDER — LIDOCAINE 2% (20 MG/ML) 5 ML SYRINGE
INTRAMUSCULAR | Status: DC | PRN
  Administered 2023-03-19: 60 mg via INTRAVENOUS

## 2023-03-19 MED ORDER — DEXAMETHASONE SODIUM PHOSPHATE 10 MG/ML IJ SOLN
INTRAMUSCULAR | Status: DC | PRN
  Administered 2023-03-19: 10 mg via INTRAVENOUS

## 2023-03-19 MED ORDER — PROPOFOL 10 MG/ML IV BOLUS
INTRAVENOUS | Status: DC | PRN
  Administered 2023-03-19: 50 mg via INTRAVENOUS
  Administered 2023-03-19: 150 mg via INTRAVENOUS
  Administered 2023-03-19: 125 ug/kg/min via INTRAVENOUS

## 2023-03-19 MED ORDER — OXYMETAZOLINE HCL 0.05 % NA SOLN
NASAL | Status: DC | PRN
Start: 2023-03-19 — End: 2023-03-19
  Administered 2023-03-19: 1

## 2023-03-19 SURGICAL SUPPLY — 25 items
BAG COUNTER SPONGE SURGICOUNT (BAG) ×1 IMPLANT
CANISTER SUCT 3000ML PPV (MISCELLANEOUS) ×1 IMPLANT
COAGULATOR SUCT SWTCH 10FR 6 (ELECTROSURGICAL) ×1 IMPLANT
COVER SURGICAL LIGHT HANDLE (MISCELLANEOUS) IMPLANT
DRAPE HALF SHEET 40X57 (DRAPES) IMPLANT
ELECT REM PT RETURN 9FT ADLT (ELECTROSURGICAL) ×1 IMPLANT
ELECTRODE REM PT RTRN 9FT ADLT (ELECTROSURGICAL) ×1 IMPLANT
GAUZE SPONGE 2X2 8PLY STRL LF (GAUZE/BANDAGES/DRESSINGS) ×1 IMPLANT
GLOVE ECLIPSE 7.5 STRL STRAW (GLOVE) ×1 IMPLANT
GOWN STRL REUS W/ TWL LRG LVL3 (GOWN DISPOSABLE) ×2 IMPLANT
KIT BASIN OR (CUSTOM PROCEDURE TRAY) ×1 IMPLANT
KIT TURNOVER KIT B (KITS) ×1 IMPLANT
NDL HYPO 25GX1X1/2 BEV (NEEDLE) ×1 IMPLANT
NEEDLE HYPO 25GX1X1/2 BEV (NEEDLE) ×1 IMPLANT
NS IRRIG 1000ML POUR BTL (IV SOLUTION) ×1 IMPLANT
PAD ARMBOARD 7.5X6 YLW CONV (MISCELLANEOUS) ×2 IMPLANT
SPLINT NASAL DOYLE BI-VL (GAUZE/BANDAGES/DRESSINGS) ×1 IMPLANT
SPONGE NEURO XRAY DETECT 1X3 (DISPOSABLE) ×1 IMPLANT
SUT BONE WAX W31G (SUTURE) IMPLANT
SUT CHROMIC 4 0 SH 27 (SUTURE) ×1 IMPLANT
SUT PLAIN 4 0 ~~LOC~~ 1 (SUTURE) ×1 IMPLANT
SUT PROLENE 3 0 PS 2 (SUTURE) ×1 IMPLANT
TRAY ENT MC OR (CUSTOM PROCEDURE TRAY) ×1 IMPLANT
TUBE SALEM SUMP 16F (TUBING) IMPLANT
TUBING EXTENTION W/L.L. (IV SETS) IMPLANT

## 2023-03-19 NOTE — Anesthesia Postprocedure Evaluation (Signed)
 Anesthesia Post Note  Patient: George [name]  Procedure(s) Performed: SEPTOPLASTY, NOSE, WITH NASAL TURBINATE REDUCTION (Nose)     Patient location during evaluation: PACU Anesthesia Type: General Level of consciousness: awake and alert Pain management: pain level controlled Vital Signs Assessment: post-procedure vital signs reviewed and stable Respiratory status: spontaneous breathing, nonlabored ventilation and respiratory function stable Cardiovascular status: blood pressure returned to baseline and stable Postop Assessment: no apparent nausea or vomiting Anesthetic complications: no  No notable events documented.  Last Vitals:  Vitals:   03/19/23 1543 03/19/23 1545  BP: (!) 165/93 (!) 157/93  Pulse: (!) 53 (!) 52  Resp: 11 10  Temp:  36.6 C  SpO2: 90% 95%    Last Pain:  Vitals:   03/19/23 1545  TempSrc:   PainSc: 2                  Verlisa Vara,W. EDMOND

## 2023-03-19 NOTE — Op Note (Signed)
 DATE OF PROCEDURE: 03/19/2023  OPERATIVE REPORT   SURGEON: Newman Pies, MD   PREOPERATIVE DIAGNOSES:  1. Severe nasal septal deviation.  2. Bilateral inferior turbinate hypertrophy.  3. Chronic nasal obstruction.  POSTOPERATIVE DIAGNOSES:  1. Severe nasal septal deviation.  2. Bilateral inferior turbinate hypertrophy.  3. Chronic nasal obstruction.  PROCEDURE PERFORMED:  1. Septoplasty.  2. Bilateral partial inferior turbinate resection.   ANESTHESIA: General endotracheal tube anesthesia.   COMPLICATIONS: None.   ESTIMATED BLOOD LOSS: 150 mL.   INDICATION FOR PROCEDURE: George [name] is a 73 y.o. male with a history of chronic nasal obstruction. The patient was treated with antihistamine, decongestant, and steroid nasal sprays. However, the patient continued to be symptomatic. On examination, the patient was noted to have bilateral severe inferior turbinate hypertrophy and significant nasal septal deviation, causing significant nasal obstruction. Based on the above findings, the decision was made for the patient to undergo the above-stated procedures. The risks, benefits, alternatives, and details of the procedures were discussed with the patient. Questions were invited and answered. Informed consent was obtained.   DESCRIPTION OF PROCEDURE: The patient was taken to the operating room and placed supine on the operating table. General endotracheal tube anesthesia was administered by the anesthesiologist. The patient was positioned, and prepped and draped in the standard fashion for nasal surgery. Pledgets soaked with Afrin were placed in both nasal cavities for decongestion. The pledgets were subsequently removed.   Examination of the nasal cavity revealed a severe nasal septal deviation. 1% lidocaine with 1:100,000 epinephrine was injected onto the nasal septum bilaterally. A hemitransfixion incision was made on the left side. The mucosal flap was carefully elevated on the left side. A  cartilaginous incision was made 1 cm superior to the caudal margin of the nasal septum. Mucosal flap was also elevated on the right side in the similar fashion. It should be noted that due to the severe septal deviation, the deviated portion of the cartilaginous and bony septum had to be removed in piecemeal fashion. Once the deviated portions were removed, a straight midline septum was achieved. The septum was then quilted with 4-0 plain gut sutures. The hemitransfixion incision was closed with interrupted 4-0 chromic sutures.   The inferior one half of both hypertrophied inferior turbinate was crossclamped with a Kelly clamp. The inferior one half of each inferior turbinate was then resected with a pair of cross cutting scissors. Hemostasis was achieved with a suction cautery device. Doyle splints were applied to the nasal septum.  The care of the patient was turned over to the anesthesiologist. The patient was awakened from anesthesia without difficulty. The patient was extubated and transferred to the recovery room in good condition.   OPERATIVE FINDINGS: Severe nasal septal deviation and bilateral inferior turbinate hypertrophy.   SPECIMEN: None.   FOLLOWUP CARE: The patient be discharged home once he is awake and alert. The patient will follow up in my office in 3 days for splint removal.   George Reid Philomena Doheny, MD

## 2023-03-19 NOTE — H&P (Signed)
 CC: Chronic nasal obstruction   HPI:  George [name] is a 73 y.o. male who presents today complaining of chronic nasal obstruction for more than 2 years.  The severity of his nasal obstruction has increased over the past 6 months.  His nasal obstruction is worse at night.  It has resulted in chronic mouth breathing and frequent waking up at night.  He also complains of frequent sore throat as a result.  The patient has a history of environmental allergies.  He is currently seeing his allergist Dr. Eileen Stanford.  He is currently on Flonase, azelastine, Singulair, and Claritin daily.  He has been using his allergy medications for more than 2 years.  Despite the treatment, he continues to be symptomatic.  Currently he denies any facial pain, fever, or visual change.  He has no history of facial or nasal trauma.  He underwent adenotonsillectomy as a child.  He has no other ENT surgery.       Past Medical History:  Diagnosis Date   Allergy     Colon polyp      Mucosal prolapse polyp   Diverticulosis     Essential hypertension 09/22/2008    Qualifier: Diagnosis of  By: Kem Parkinson     External hemorrhoids     HOCM (hypertrophic obstructive cardiomyopathy) (HCC)     Hypertension     Hypertrophic obstructive cardiomyopathy (HCC) 09/24/2008    Qualifier: Diagnosis of  By: [name], MD, Lyn Hollingshead    Irregular heart beat     VASOMOTOR RHINITIS 12/24/2008    Qualifier: Diagnosis of  By: Amador Cunas  MD, Janett Labella    VENTRICULAR HYPERTROPHY, LEFT 09/22/2008    Qualifier: Diagnosis of  By: Kem Parkinson                 Past Surgical History:  Procedure Laterality Date   COLONOSCOPY       TONSILLECTOMY                   Family History  Problem Relation Age of Onset   Pancreatic cancer Father     Colon polyps Father          Benign   Colon cancer Neg Hx     Diabetes Neg Hx     Kidney disease Neg Hx     Gallbladder disease Neg Hx     Esophageal cancer Neg Hx     Rectal  cancer Neg Hx     Stomach cancer Neg Hx            Social History:  reports that he quit smoking about 38 years ago. His smoking use included cigarettes. He has never used smokeless tobacco. He reports that he does not currently use alcohol. He reports that he does not use drugs.   Allergies:  Allergies      Allergies  Allergen Reactions   Sulfonamide Derivatives Anaphylaxis               Prior to Admission medications   Medication Sig Start Date End Date Taking? Authorizing Provider  azelastine (ASTELIN) 0.1 % nasal spray Place 1 spray into both nostrils 2 (two) times daily. Use in each nostril as directed     Yes [provider]  desloratadine (CLARINEX) 5 MG tablet Take 5 mg by mouth daily.     Yes [provider]  doxycycline (VIBRAMYCIN) 100 MG capsule Take 1 capsule (100 mg total) by mouth 2 (two) times daily.  12/11/21   Yes Nafziger, Kandee Keen, NP  fluticasone (FLONASE) 50 MCG/ACT nasal spray SPRAY 2 SPRAYS INTO EACH NOSTRIL EVERY DAY 08/12/20   Yes Philip Aspen, Limmie Patricia, MD  levocetirizine (XYZAL) 5 MG tablet Take 5 mg by mouth every evening.     Yes [provider]  metoprolol succinate (TOPROL-XL) 25 MG 24 hr tablet TAKE 1 TABLET BY MOUTH EVERY DAY 11/17/22   Yes Crenshaw, [name], MD  montelukast (SINGULAIR) 10 MG tablet Take 10 mg by mouth daily. 12/04/19   Yes [provider]  Multiple Vitamin (MULTIVITAMIN) tablet Take 1 tablet by mouth daily.     Yes [provider]  RESVERATROL PO Take 325 each by mouth.     Yes [provider]  hydrocortisone (ANUSOL-HC) 25 MG suppository UNWRAP AND INSERT ONE SUPPOSITORY RECTALLY TWICE DAILY Patient not taking: Reported on 01/20/2023 09/21/22     Philip Aspen, Limmie Patricia, MD  meloxicam (MOBIC) 7.5 MG tablet Take 1 tablet (7.5 mg total) by mouth daily. Patient not taking: Reported on 01/20/2023 11/19/22     Philip Aspen, Limmie Patricia, MD  predniSONE (STERAPRED UNI-PAK 21 TAB) 10 MG (21)  TBPK tablet Take as directed Patient not taking: Reported on 09/25/2022 06/11/22     Philip Aspen, Limmie Patricia, MD      Blood pressure 131/85, pulse 66, height 5\' 10"  (1.778 m), weight 193 lb (87.5 kg), SpO2 96%. Exam: General: Communicates without difficulty, well nourished, no acute distress. Head: Normocephalic, no evidence injury, no tenderness, facial buttresses intact without stepoff. Face/sinus: No tenderness to palpation and percussion. Facial movement is normal and symmetric. Eyes: PERRL, EOMI. No scleral icterus, conjunctivae clear. Neuro: CN II exam reveals vision grossly intact.  No nystagmus at any point of gaze. Ears: Auricles well formed without lesions.  Ear canals are intact without mass or lesion.  No erythema or edema is appreciated.  The TMs are intact without fluid. Nose: External evaluation reveals normal support and skin without lesions.  Dorsum is intact.  Anterior rhinoscopy reveals congested mucosa over anterior aspect of inferior turbinates and intact septum.  No purulence noted. Oral:  Oral cavity and oropharynx are intact, symmetric, without erythema or edema.  Mucosa is moist without lesions. Neck: Full range of motion without pain.  There is no significant lymphadenopathy.  No masses palpable.  Thyroid bed within normal limits to palpation.  Parotid glands and submandibular glands equal bilaterally without mass.  Trachea is midline. Neuro:  CN 2-12 grossly intact.    Procedure:  Flexible Nasal Endoscopy: Description: Risks, benefits, and alternatives of flexible endoscopy were explained to the patient.  Specific mention was made of the risk of throat numbness with difficulty swallowing, possible bleeding from the nose and mouth, and pain from the procedure.  The patient gave oral consent to proceed.  The flexible scope was inserted into the right nasal cavity.  Endoscopy of the interior nasal cavity, superior, inferior, and middle meatus was performed. The sphenoid-ethmoid  recess was examined. Edematous mucosa was noted.  No polyp, mass, or lesion was appreciated. Nasal septal deviation noted. Olfactory cleft was clear.  Nasopharynx was clear.  Turbinates were hypertrophied but without mass.  The procedure was repeated on the contralateral side with similar findings.  The patient tolerated the procedure well.    Assessment: 1.  Chronic rhinitis with nasal mucosal congestion, nasal septal deviation, and bilateral inferior turbinate hypertrophy. 2.  More than 95% of his nasal passageways are obstructed bilaterally. 3.  The  patient has not responded to maximal medical therapy for the past 2 years.   Plan: 1.  The physical exam and nasal endoscopy findings are reviewed with the patient. 2.  Continue with his current allergy treatment regimen of Flonase, azelastine, Singulair, and Claritin. 3.  In light of his persistent symptoms, he will benefit from surgical intervention with septoplasty and bilateral turbinate reduction.  The risk, benefits, and details of the procedures are reviewed.  Questions are invited and answered. 4.  The patient would like to proceed with the procedures.

## 2023-03-19 NOTE — Telephone Encounter (Signed)
 Left vm to confirm appt and address for 03/22/2023.

## 2023-03-19 NOTE — Discharge Instructions (Signed)

## 2023-03-19 NOTE — Anesthesia Procedure Notes (Signed)
 Procedure Name: Intubation Date/Time: 03/19/2023 1:40 PM  Performed by: Debbe Odea, CRNAPre-anesthesia Checklist: Patient identified, Emergency Drugs available, Suction available and Patient being monitored Patient Re-evaluated:Patient Re-evaluated prior to induction Oxygen Delivery Method: Circle System Utilized Preoxygenation: Pre-oxygenation with 100% oxygen Induction Type: IV induction Ventilation: Mask ventilation without difficulty Laryngoscope Size: Glidescope and 3 Grade View: Grade I Tube type: Oral Tube size: 7.5 mm Number of attempts: 2 (first atttempt with miller 2, copious secretions with difficulty visualizing airway even after oral suctioning) Airway Equipment and Method: Stylet Placement Confirmation: ETT inserted through vocal cords under direct vision, positive ETCO2 and breath sounds checked- equal and bilateral Secured at: 22 cm Tube secured with: Tape Dental Injury: Teeth and Oropharynx as per pre-operative assessment

## 2023-03-19 NOTE — Progress Notes (Signed)
 Pt's HR 48-51. Dr. Sampson Goon is aware.

## 2023-03-19 NOTE — Transfer of Care (Signed)
 Immediate Anesthesia Transfer of Care Note  Patient: George [name]  Procedure(s) Performed: SEPTOPLASTY, NOSE, WITH NASAL TURBINATE REDUCTION (Nose)  Patient Location: PACU  Anesthesia Type:General  Level of Consciousness: awake and sedated  Airway & Oxygen Therapy: Patient Spontanous Breathing and Patient connected to face mask oxygen  Post-op Assessment: Report given to RN and Post -op Vital signs reviewed and stable  Post vital signs: Reviewed and stable  Last Vitals:  Vitals Value Taken Time  BP 204/106 03/19/23 1457  Temp 36.5 C 03/19/23 1449  Pulse 60 03/19/23 1459  Resp 15 03/19/23 1459  SpO2 98 % 03/19/23 1459  Vitals shown include unfiled device data.  Last Pain:  Vitals:   03/19/23 1449  TempSrc:   PainSc: 0-No pain         Complications: No notable events documented.

## 2023-03-20 ENCOUNTER — Encounter (HOSPITAL_COMMUNITY): Payer: Self-pay | Admitting: Otolaryngology

## 2023-03-22 ENCOUNTER — Ambulatory Visit (INDEPENDENT_AMBULATORY_CARE_PROVIDER_SITE_OTHER): Payer: Medicare Other | Admitting: Otolaryngology

## 2023-03-22 ENCOUNTER — Encounter (INDEPENDENT_AMBULATORY_CARE_PROVIDER_SITE_OTHER): Payer: Self-pay

## 2023-03-22 VITALS — BP 122/86 | HR 75 | Ht 70.0 in | Wt 200.0 lb

## 2023-03-22 DIAGNOSIS — J310 Chronic rhinitis: Secondary | ICD-10-CM

## 2023-03-22 DIAGNOSIS — Z9889 Other specified postprocedural states: Secondary | ICD-10-CM

## 2023-03-22 NOTE — Progress Notes (Signed)
 Patient ID: George [name], male   DOB: 09-21-1950, 73 y.o.   MRN: [id number]  [name] splints removed. Septum and turbinates are healing well.   Both [name] debrided.  Nasal saline irrigation.  Recheck in 3 weeks.

## 2023-03-30 ENCOUNTER — Encounter: Payer: Self-pay | Admitting: Internal Medicine

## 2023-03-30 ENCOUNTER — Ambulatory Visit (INDEPENDENT_AMBULATORY_CARE_PROVIDER_SITE_OTHER): Payer: Medicare Other | Admitting: Internal Medicine

## 2023-03-30 VITALS — BP 132/84 | HR 65 | Temp 98.3°F | Ht 69.0 in | Wt 195.7 lb

## 2023-03-30 DIAGNOSIS — I10 Essential (primary) hypertension: Secondary | ICD-10-CM | POA: Diagnosis not present

## 2023-03-30 DIAGNOSIS — J310 Chronic rhinitis: Secondary | ICD-10-CM | POA: Diagnosis not present

## 2023-03-30 DIAGNOSIS — E782 Mixed hyperlipidemia: Secondary | ICD-10-CM | POA: Diagnosis not present

## 2023-03-30 DIAGNOSIS — Z125 Encounter for screening for malignant neoplasm of prostate: Secondary | ICD-10-CM

## 2023-03-30 DIAGNOSIS — Z0000 Encounter for general adult medical examination without abnormal findings: Secondary | ICD-10-CM | POA: Diagnosis not present

## 2023-03-30 LAB — COMPREHENSIVE METABOLIC PANEL
ALT: 8 U/L (ref 0–53)
AST: 14 U/L (ref 0–37)
Albumin: 4.6 g/dL (ref 3.5–5.2)
Alkaline Phosphatase: 50 U/L (ref 39–117)
BUN: 16 mg/dL (ref 6–23)
CO2: 27 meq/L (ref 19–32)
Calcium: 9.9 mg/dL (ref 8.4–10.5)
Chloride: 99 meq/L (ref 96–112)
Creatinine, Ser: 1.01 mg/dL (ref 0.40–1.50)
GFR: 74.99 mL/min (ref >60.00)
Glucose, Bld: 112 mg/dL — ABNORMAL HIGH (ref 70–99)
Potassium: 4 meq/L (ref 3.5–5.1)
Sodium: 137 meq/L (ref 135–145)
Total Bilirubin: 0.7 mg/dL (ref 0.2–1.2)
Total Protein: 6.9 g/dL (ref 6.0–8.3)

## 2023-03-30 LAB — LIPID PANEL
Cholesterol: 186 mg/dL (ref 0–200)
HDL: 46.8 mg/dL (ref >39.00)
LDL Cholesterol: 123 mg/dL — ABNORMAL HIGH (ref 0–99)
NonHDL: 139.54
Total CHOL/HDL Ratio: 4
Triglycerides: 83 mg/dL (ref 0.0–149.0)
VLDL: 16.6 mg/dL (ref 0.0–40.0)

## 2023-03-30 LAB — CBC WITH DIFFERENTIAL/PLATELET
Basophils Absolute: 0.1 10*3/uL (ref 0.0–0.1)
Basophils Relative: 0.6 % (ref 0.0–3.0)
Eosinophils Absolute: 0.1 10*3/uL (ref 0.0–0.7)
Eosinophils Relative: 1.4 % (ref 0.0–5.0)
HCT: 41.1 % (ref 39.0–52.0)
Hemoglobin: 14.4 g/dL (ref 13.0–17.0)
Lymphocytes Relative: 18.1 % (ref 12.0–46.0)
Lymphs Abs: 1.5 10*3/uL (ref 0.7–4.0)
MCHC: 35.1 g/dL (ref 30.0–36.0)
MCV: 85.8 fl (ref 78.0–100.0)
Monocytes Absolute: 0.6 10*3/uL (ref 0.1–1.0)
Monocytes Relative: 7.4 % (ref 3.0–12.0)
Neutro Abs: 6.1 10*3/uL (ref 1.4–7.7)
Neutrophils Relative %: 72.5 % (ref 43.0–77.0)
Platelets: 224 10*3/uL (ref 150.0–400.0)
RBC: 4.79 Mil/uL (ref 4.22–5.81)
RDW: 13.7 % (ref 11.5–15.5)
WBC: 8.4 10*3/uL (ref 4.0–10.5)

## 2023-03-30 LAB — PSA: PSA: 0.66 ng/mL (ref 0.10–4.00)

## 2023-03-30 NOTE — Progress Notes (Signed)
 Established Patient Office Visit     CC/Reason for Visit: Welcome to Medicare visit  HPI: George [name] is a 73 y.o. male who is coming in today for the above mentioned reasons. Past Medical History is significant for: Hypertension, seasonal allergies and HOCM.  He is followed by Dr. [name].  He also recently had septal turbinate surgery by Dr. Suszanne Conners.  He is recovering well.  He had his 3-year colonoscopy over the fall, will obtain records from [facility].  Immunizations are up-to-date.   Past Medical/Surgical History: Past Medical History:  Diagnosis Date   Allergy    Colon polyp    Mucosal prolapse polyp   Diverticulosis    Essential hypertension 09/22/2008   Qualifier: Diagnosis of  By: Kem Parkinson     External hemorrhoids    HOCM (hypertrophic obstructive cardiomyopathy) (HCC)    Hypertension    Hypertrophic obstructive cardiomyopathy (HCC) 09/24/2008   Qualifier: Diagnosis of  By: [name], MD, Lyn Hollingshead    Irregular heart beat    VASOMOTOR RHINITIS 12/24/2008   Qualifier: Diagnosis of  By: Amador Cunas  MD, Janett Labella    VENTRICULAR HYPERTROPHY, LEFT 09/22/2008   Qualifier: Diagnosis of  By: Kem Parkinson      Past Surgical History:  Procedure Laterality Date   COLONOSCOPY     NASAL SEPTOPLASTY W/ TURBINOPLASTY N/A 03/19/2023   Procedure: SEPTOPLASTY, NOSE, WITH NASAL TURBINATE REDUCTION;  Surgeon: Newman Pies, MD;  Location: [facility] OR;  Service: ENT;  Laterality: N/A;   TONSILLECTOMY      Social History:  reports that he quit smoking about 38 years ago. His smoking use included cigarettes. He has never used smokeless tobacco. He reports that he does not currently use alcohol. He reports that he does not use drugs.  Allergies: Allergies  Allergen Reactions   Sulfonamide Derivatives Anaphylaxis    Family History:  Family History  Problem Relation Age of Onset   Pancreatic cancer Father    Colon polyps Father        Benign   Colon cancer Neg Hx     Diabetes Neg Hx    Kidney disease Neg Hx    Gallbladder disease Neg Hx    Esophageal cancer Neg Hx    Rectal cancer Neg Hx    Stomach cancer Neg Hx      Current Outpatient Medications:    desloratadine (CLARINEX) 5 MG tablet, Take 5 mg by mouth daily., Disp: , Rfl:    metoprolol succinate (TOPROL-XL) 25 MG 24 hr tablet, TAKE 1 TABLET BY MOUTH EVERY DAY, Disp: 90 tablet, Rfl: 3   montelukast (SINGULAIR) 10 MG tablet, Take 10 mg by mouth daily., Disp: , Rfl:    Multiple Vitamin (MULTIVITAMIN) tablet, Take 1 tablet by mouth daily., Disp: , Rfl:    RESVERATROL PO, Take 1 tablet by mouth daily., Disp: , Rfl:   Review of Systems:  Negative unless indicated in HPI.   Physical Exam: Vitals:   03/30/23 0920  BP: 132/84  Pulse: 65  Temp: 98.3 F (36.8 C)  TempSrc: Oral  SpO2: 99%  Weight: 195 lb 11.2 oz (88.8 kg)  Height: 5\' 9"  (1.753 m)    Body mass index is 28.9 kg/m.   Welcome to Medicare wellness visit   1. Risk factors, based on past  M,S,F - Cardiac Risk Factors include: advanced age (>91men, >45 women);hypertension   2.  Physical activities: Dietary issues and exercise activities discussed:  3.  Depression/mood:  Flowsheet Row Office Visit from 03/30/2023 in [facility] Total Score 0        4.  ADL's:    03/29/2023   11:36 AM 03/19/2023   10:56 AM  In your present state of health, do you have any difficulty performing the following activities:  Hearing? 0   Vision? 0   Difficulty concentrating or making decisions? 0   Walking or climbing stairs? 0   Dressing or bathing? 0   Doing errands, shopping? 0 0  Preparing Food and eating ? N   Using the Toilet? N   In the past six months, have you accidently leaked urine? N   Do you have problems with loss of bowel control? N   Managing your Medications? N   Managing your Finances? N   Housekeeping or managing your Housekeeping? N      5.  Fall risk:      06/11/2022    3:45 PM 11/19/2022   11:46 AM 01/26/2023    4:17 PM 03/29/2023   11:36 AM 03/30/2023    9:21 AM  Fall Risk  Falls in the past year? 0 0 0 0 0  Was there an injury with Fall? 0 0 0  0  Fall Risk Category Calculator 0 0 0  0  Fall risk Follow up Falls evaluation completed Falls evaluation completed Falls evaluation completed  Falls evaluation completed     6.  Home safety: No problems identified   7.  Height weight, and visual acuity: height and weight as above, vision/hearing: Vision Screening   Right eye Left eye Both eyes  Without correction 20/32 20/32 20/32   With correction        8.  Counseling: Counseling given: Not Answered    9. Lab orders based on risk factors: Laboratory update will be reviewed   10. Cognitive assessment:        03/30/2023    9:24 AM 03/30/2023    9:22 AM  6CIT Screen  What Year?  0 points  What month?  0 points  What time?  0 points  Count back from 20  0 points  Months in reverse  0 points  Repeat phrase 0 points      11. Screening: Patient provided with a written and personalized 5-10 year screening schedule in the AVS. Health Maintenance  Topic Date Due   Colon Cancer Screening  04/26/2022   COVID-19 Vaccine (5 - 2024-25 season) 09/13/2022   Flu Shot  04/12/2023*   Medicare Annual Wellness Visit  03/29/2024   DTaP/Tdap/Td vaccine (2 - Td or Tdap) 02/13/2030   Pneumonia Vaccine  Completed   Hepatitis C Screening  Completed   Zoster (Shingles) Vaccine  Completed   HPV Vaccine  Aged Out  *Topic was postponed. The date shown is not the original due date.    12. Provider List Update: Patient Care Team    Relationship Specialty Notifications Start End  Philip Aspen, Limmie Patricia, MD PCP - General Internal Medicine  07/14/18   Lewayne Bunting, MD PCP - Cardiology Cardiology  10/13/19   Lewayne Bunting, MD  Cardiology  04/22/11      13. Advance Directives: Does Patient Have a Medical Advance Directive?: No Would patient  like information on creating a medical advance directive?: No - Patient declined  14. Opioids: Patient is not on any opioid prescriptions and has no risk factors for a substance use disorder.  15.   Goals      Activity and Exercise Increased     Evidence-based guidance:  Review current exercise levels.  Assess patient perspective on exercise or activity level, barriers to increasing activity, motivation and readiness for change.  Recommend or set healthy exercise goal based on individual tolerance.  Encourage small steps toward making change in amount of exercise or activity.  Urge reduction of sedentary activities or screen time.  Promote group activities within the community or with family or support person.  Consider referral to rehabiliation therapist for assessment and exercise/activity plan.   Notes:          I have personally reviewed and noted the following in the patient's chart:   Medical and social history Use of alcohol, tobacco or illicit drugs  Current medications and supplements Functional ability and status Nutritional status Physical activity Advanced directives List of other physicians Hospitalizations, surgeries, and ER visits in previous 12 months Vitals Screenings to include cognitive, depression, and falls Referrals and appointments  In addition, I have reviewed and discussed with patient certain preventive protocols, quality metrics, and best practice recommendations. A written personalized care plan for preventive services as well as general preventive health recommendations were provided to patient.   Impression and Plan:  Mixed hyperlipidemia -     Comprehensive metabolic panel; Future -     Lipid panel; Future  Essential hypertension -     CBC with Differential/Platelet; Future  Welcome to Medicare preventive visit -     PSA; Future  Chronic rhinitis  Prostate cancer screening   -Recommend routine eye and dental care. -Healthy  lifestyle discussed in detail. -Labs to be updated today. -Prostate cancer screening: PSA today Health Maintenance  Topic Date Due   Colon Cancer Screening  04/26/2022   COVID-19 Vaccine (5 - 2024-25 season) 09/13/2022   Flu Shot  04/12/2023*   Medicare Annual Wellness Visit  03/29/2024   DTaP/Tdap/Td vaccine (2 - Td or Tdap) 02/13/2030   Pneumonia Vaccine  Completed   Hepatitis C Screening  Completed   Zoster (Shingles) Vaccine  Completed   HPV Vaccine  Aged Out  *Topic was postponed. The date shown is not the original due date.        Chaya Jan, MD [facility] at [facility]

## 2023-03-31 ENCOUNTER — Encounter: Payer: Self-pay | Admitting: Internal Medicine

## 2023-04-06 ENCOUNTER — Emergency Department (HOSPITAL_COMMUNITY)
Admission: EM | Admit: 2023-04-06 | Discharge: 2023-04-06 | Disposition: A | Discharge status: Home / Self Care | Attending: Emergency Medicine | Admitting: Emergency Medicine

## 2023-04-06 ENCOUNTER — Encounter (HOSPITAL_COMMUNITY): Payer: Self-pay

## 2023-04-06 ENCOUNTER — Telehealth (INDEPENDENT_AMBULATORY_CARE_PROVIDER_SITE_OTHER): Payer: Self-pay | Admitting: Otolaryngology

## 2023-04-06 ENCOUNTER — Other Ambulatory Visit: Payer: Self-pay

## 2023-04-06 ENCOUNTER — Ambulatory Visit (INDEPENDENT_AMBULATORY_CARE_PROVIDER_SITE_OTHER): Admitting: Otolaryngology

## 2023-04-06 ENCOUNTER — Encounter (INDEPENDENT_AMBULATORY_CARE_PROVIDER_SITE_OTHER): Payer: Self-pay

## 2023-04-06 VITALS — BP 180/94 | HR 52 | Ht 69.0 in | Wt 190.0 lb

## 2023-04-06 DIAGNOSIS — Z9889 Other specified postprocedural states: Secondary | ICD-10-CM

## 2023-04-06 DIAGNOSIS — R040 Epistaxis: Secondary | ICD-10-CM | POA: Insufficient documentation

## 2023-04-06 MED ORDER — METOPROLOL SUCCINATE ER 25 MG PO TB24
25.0000 mg | ORAL_TABLET | Freq: Once | ORAL | Status: AC
  Administered 2023-04-06: 25 mg via ORAL
  Filled 2023-04-06: qty 1

## 2023-04-06 MED ORDER — OXYMETAZOLINE HCL 0.05 % NA SOLN
1.0000 | Freq: Once | NASAL | Status: AC
  Administered 2023-04-06: 1 via NASAL

## 2023-04-06 NOTE — Telephone Encounter (Signed)
 Patient called this morning stated that his nose has been bleeding from both nostrils for about 40 minutes ( he had septoplasty and turbinate reduction on 03/19/23). He states that he can not stop the bleeding. Advised him to go to ED. Dr. Suszanne Conners is aware.

## 2023-04-06 NOTE — Discharge Instructions (Signed)
 Go directly to Dr. Suszanne Conners office

## 2023-04-06 NOTE — ED Provider Notes (Signed)
 [place] EMERGENCY DEPARTMENT AT [facility] Provider Note   CSN: [id number] Arrival date & time: 04/06/23  1006     History  Chief Complaint  Patient presents with   Epistaxis    George [name] is a 73 y.o. male.  73 year old male presents with nosebleed which began this morning.  He does not take any blood thinners.  Patient had a recent septoplasty 2 weeks ago due to sinus congestion.  States that blood was gushing out of both nares and the back of his throat.  Called EMS and a nasal clamp was applied.  The bleeding has subsided at this point.  His ENT doctor is Dr. Suszanne Conners who he spoke with prior to arrival here.  Denies any other history of nasal trauma       Home Medications Prior to Admission medications   Medication Sig Start Date End Date Taking? Authorizing Provider  desloratadine (CLARINEX) 5 MG tablet Take 5 mg by mouth daily.    [provider]  metoprolol succinate (TOPROL-XL) 25 MG 24 hr tablet TAKE 1 TABLET BY MOUTH EVERY DAY 11/17/22   Lewayne Bunting, MD  montelukast (SINGULAIR) 10 MG tablet Take 10 mg by mouth daily. 12/04/19   [provider]  Multiple Vitamin (MULTIVITAMIN) tablet Take 1 tablet by mouth daily.    [provider]  RESVERATROL PO Take 1 tablet by mouth daily.    [provider]      Allergies    Sulfonamide derivatives    Review of Systems   Review of Systems  All other systems reviewed and are negative.   Physical Exam Updated Vital Signs BP (!) 154/112   Pulse 73   Temp 98.3 F (36.8 C) (Oral)   Resp 12   Ht 1.753 m (5\' 9" )   Wt 86.2 kg   SpO2 98%   BMI 28.06 kg/m  Physical Exam Vitals and nursing note reviewed.  Constitutional:      General: He is not in acute distress.    Appearance: Normal appearance. He is well-developed. He is not toxic-appearing.  HENT:     Head: Normocephalic and atraumatic.     Nose:     Comments: Right blood and what appears to be sutures noted in  the nares.  No active bleeding appreciated.  No posterior pharyngeal bleeding Eyes:     General: Lids are normal.     Conjunctiva/sclera: Conjunctivae normal.     Pupils: Pupils are equal, round, and reactive to light.  Neck:     Thyroid: No thyroid mass.     Trachea: No tracheal deviation.  Cardiovascular:     Rate and Rhythm: Normal rate and regular rhythm.     Heart sounds: Normal heart sounds. No murmur heard.    No gallop.  Pulmonary:     Effort: Pulmonary effort is normal. No respiratory distress.     Breath sounds: Normal breath sounds. No stridor. No decreased breath sounds, wheezing, rhonchi or rales.  Abdominal:     General: There is no distension.     Palpations: Abdomen is soft.     Tenderness: There is no abdominal tenderness. There is no rebound.  Musculoskeletal:        General: No tenderness. Normal range of motion.     Cervical back: Normal range of motion and neck supple.  Skin:    General: Skin is warm and dry.     Findings: No abrasion or rash.  Neurological:  Mental Status: He is alert and oriented to person, place, and time. Mental status is at baseline.     GCS: GCS eye subscore is 4. GCS verbal subscore is 5. GCS motor subscore is 6.     Cranial Nerves: No cranial nerve deficit.     Sensory: No sensory deficit.     Motor: Motor function is intact.  Psychiatric:        Attention and Perception: Attention normal.        Speech: Speech normal.        Behavior: Behavior normal.     ED Results / Procedures / Treatments   Labs (all labs ordered are listed, but only abnormal results are displayed) Labs Reviewed - No data to display  EKG None  Radiology No results found.  Procedures Procedures    Medications Ordered in ED Medications  metoprolol succinate (TOPROL-XL) 24 hr tablet 25 mg (has no administration in time range)  oxymetazoline (AFRIN) 0.05 % nasal spray 1 spray (1 spray Each Nare Given 04/06/23 1014)    ED Course/ Medical  Decision Making/ A&P                                 Medical Decision Making Risk OTC drugs. Prescription drug management.   Patient's bleeding controlled here with nasal clamp.  Discussed with Dr. Suszanne Conners who wants to see the patient in his office.  Patient given his daily dose of high blood pressure medication here.        Final Clinical Impression(s) / ED Diagnoses Final diagnoses:  None    Rx / DC Orders ED Discharge Orders     None         Lorre Nick, MD 04/06/23 1121

## 2023-04-06 NOTE — ED Notes (Signed)
 Called dr Suszanne Conners left message

## 2023-04-06 NOTE — ED Triage Notes (Signed)
 Pt woke up around 9 am with a nose bleed from both nostrils. Pt denies headache or dizziness. Pt does not take any blood thinners

## 2023-04-06 NOTE — ED Notes (Signed)
 EDP at [facility]

## 2023-04-06 NOTE — Progress Notes (Signed)
 Patient ID: George [name], male   DOB: [birth year], 73 y.o.   MRN: [id number]  The patient had an episode of severe bleeding this morning.  The bleeding has spontaneously resolved.  His septum and turbinates are noted to be healing well.  Continue with nasal saline irrigation daily.  Follow-up in 1 week.

## 2023-04-06 NOTE — ED Notes (Signed)
 Alert, NAD, calm, interactive, resp e/u, speaking in clear complete sentences. Denies pain or HA. BP elevated. Has not had his metoprolol this morning. Reports bilateral nare nosebleed, R>L. H/o sinus/nasal surgery ~ 2 weeks ago, has been using allergy meds and nasal sprays. Denies trauma.

## 2023-04-07 ENCOUNTER — Other Ambulatory Visit (INDEPENDENT_AMBULATORY_CARE_PROVIDER_SITE_OTHER): Payer: Self-pay | Admitting: Otolaryngology

## 2023-04-07 ENCOUNTER — Ambulatory Visit (INDEPENDENT_AMBULATORY_CARE_PROVIDER_SITE_OTHER): Admitting: Otolaryngology

## 2023-04-07 ENCOUNTER — Encounter (INDEPENDENT_AMBULATORY_CARE_PROVIDER_SITE_OTHER): Payer: Self-pay

## 2023-04-07 VITALS — Ht 69.0 in | Wt 190.0 lb

## 2023-04-07 DIAGNOSIS — Z9889 Other specified postprocedural states: Secondary | ICD-10-CM

## 2023-04-07 DIAGNOSIS — R040 Epistaxis: Secondary | ICD-10-CM

## 2023-04-07 MED ORDER — AMOXICILLIN 875 MG PO TABS: 875.0000 mg | ORAL_TABLET | Freq: Two times a day (BID) | ORAL | 0 refills | Status: AC

## 2023-04-07 NOTE — Progress Notes (Signed)
 Patient ID: George [name], male   DOB: 05-29-1950, 73 y.o.   MRN: [id number]  The patient had more bleeding this morning.  The bleeding is worse on the right side.  A Merosel packing is placed, with good hemostasis.  Will leave the packing in place until Monday.  Oral antibiotic while the packing is in place.

## 2023-04-09 ENCOUNTER — Telehealth (INDEPENDENT_AMBULATORY_CARE_PROVIDER_SITE_OTHER): Payer: Self-pay | Admitting: Otolaryngology

## 2023-04-09 ENCOUNTER — Other Ambulatory Visit (INDEPENDENT_AMBULATORY_CARE_PROVIDER_SITE_OTHER): Payer: Self-pay | Admitting: Otolaryngology

## 2023-04-09 MED ORDER — OXYCODONE-ACETAMINOPHEN 5-325 MG PO TABS: 1.0000 | ORAL_TABLET | Freq: Four times a day (QID) | ORAL | 0 refills | Status: AC | PRN

## 2023-04-09 NOTE — Telephone Encounter (Signed)
 Patient called today and stated that he has" oozing" from his nasal packing on the right side. Per Dr.Teoh this is OK. He will use ice pack and change the gauze around the packing as needed. He will f/u with Dr. Suszanne Conners on Monday 04/12/23 for nasal packing removal. Dt.Teoh called in pain pain medication to [facility] on [address].

## 2023-04-12 ENCOUNTER — Encounter (INDEPENDENT_AMBULATORY_CARE_PROVIDER_SITE_OTHER): Payer: Self-pay

## 2023-04-12 ENCOUNTER — Ambulatory Visit (INDEPENDENT_AMBULATORY_CARE_PROVIDER_SITE_OTHER): Admitting: Otolaryngology

## 2023-04-12 VITALS — BP 126/87 | HR 80 | Ht 69.0 in | Wt 185.0 lb

## 2023-04-12 DIAGNOSIS — R040 Epistaxis: Secondary | ICD-10-CM

## 2023-04-12 DIAGNOSIS — J310 Chronic rhinitis: Secondary | ICD-10-CM

## 2023-04-12 DIAGNOSIS — Z9889 Other specified postprocedural states: Secondary | ICD-10-CM

## 2023-04-12 NOTE — Progress Notes (Signed)
 Patient ID: BRANT PEETS, male   DOB: February 02, 1950, 73 y.o.   MRN: [id number]  Bleeding resolved.  Nasal packing removed without difficulty.  Septum and turbinates are healing well.  The patient may resume his allergy medications.  Nasal saline irrigation as needed.  Recheck in 3 months.

## 2023-04-15 ENCOUNTER — Encounter (INDEPENDENT_AMBULATORY_CARE_PROVIDER_SITE_OTHER)

## 2023-04-23 LAB — HM COLONOSCOPY

## 2023-06-14 ENCOUNTER — Encounter (INDEPENDENT_AMBULATORY_CARE_PROVIDER_SITE_OTHER): Payer: Self-pay | Admitting: Otolaryngology

## 2023-06-14 ENCOUNTER — Ambulatory Visit (INDEPENDENT_AMBULATORY_CARE_PROVIDER_SITE_OTHER): Admitting: Otolaryngology

## 2023-06-14 VITALS — BP 154/94 | HR 50 | Ht 70.0 in | Wt 193.0 lb

## 2023-06-14 DIAGNOSIS — H9192 Unspecified hearing loss, left ear: Secondary | ICD-10-CM

## 2023-06-14 DIAGNOSIS — H9042 Sensorineural hearing loss, unilateral, left ear, with unrestricted hearing on the contralateral side: Secondary | ICD-10-CM

## 2023-06-15 DIAGNOSIS — H9042 Sensorineural hearing loss, unilateral, left ear, with unrestricted hearing on the contralateral side: Secondary | ICD-10-CM | POA: Insufficient documentation

## 2023-06-15 NOTE — Progress Notes (Signed)
 Patient ID: George [name], male   DOB: 07/21/1950, 73 y.o.   MRN: [id number]  Cc: Left ear hearing loss, nasal congestion  HPI: The patient is a 73 year old male who returns today for his follow-up evaluation.  The patient has a history of left ear hearing loss, secondary to multiple barotraumas.  He currently wears a left ear hearing aid.  He denies any otalgia, otorrhea, or vertigo.  The patient was previously seen for chronic nasal congestion.  He underwent septoplasty and turbinate reduction surgery 3 months ago.  His nasal congestion has significantly improved.  He denies any facial pain or fever.  Exam: General: Communicates without difficulty, well nourished, no acute distress. Head: Normocephalic, no evidence injury, no tenderness, facial buttresses intact without stepoff. Face/sinus: No tenderness to palpation and percussion. Facial movement is normal and symmetric. Eyes: PERRL, EOMI. No scleral icterus, conjunctivae clear. Neuro: CN II exam reveals vision grossly intact.  No nystagmus at any point of gaze. Ears: Auricles well formed without lesions.  Ear canals are intact without mass or lesion.  No erythema or edema is appreciated.  The TMs are intact without fluid. Nose: External evaluation reveals normal support and skin without lesions.  Dorsum is intact.  Anterior rhinoscopy reveals mildly congested mucosa over anterior aspect of inferior turbinates and intact septum.  No purulence noted. Oral:  Oral cavity and oropharynx are intact, symmetric, without erythema or edema.  Mucosa is moist without lesions. Neck: Full range of motion without pain.  There is no significant lymphadenopathy.  No masses palpable.  Thyroid bed within normal limits to palpation.  Parotid glands and submandibular glands equal bilaterally without mass.  Trachea is midline. Neuro:  CN 2-12 grossly intact.   Assessment: 1.  Left ear hearing loss, secondary to previous barotrauma. 2.  Mild chronic rhinitis.  His septum and  turbinates are well-healed.  Plan: 1.  The physical exam findings are reviewed with the patient. 2.  Nasal saline irrigation as needed. 3.  Continue the use of his left ear hearing aid. 4.  The patient will return for reevaluation in 6 months.

## 2023-09-02 ENCOUNTER — Telehealth: Payer: Self-pay | Admitting: Cardiology

## 2023-09-02 NOTE — Telephone Encounter (Signed)
 Patient is requesting a call back directly from Adrien if possible. He declined discussing with me.

## 2023-09-02 NOTE — Telephone Encounter (Signed)
 Spoke with pt, Follow up scheduled

## 2023-09-24 NOTE — Progress Notes (Signed)
 HPI: FU HOCM. A previous Myoview in July 2008 showed no ischemia or infarction. Monitor August 2023 showed sinus rhythm with 10 beats of PAT, PVCs but no nonsustained ventricular tachycardia.  Exercise treadmill August 2023 showed normal systolic blood pressure response.  Cardiac MRI October 2023 showed severe asymmetric septal hypertrophy (21 mm in the basal septum and 14 mm in the posterior wall), findings consistent with hypertrophic cardiomyopathy, less than 1% LGE the myocardial mass uptake, possible left upper lobe pulmonary nodule and CT recommended.  Abdominal ultrasound September 2024 showed no abdominal aortic aneurysm.  Echocardiogram September 2024 showed ejection fraction greater than 75%, severe left ventricular hypertrophy, grade 1 diastolic dysfunction, moderate left atrial enlargement, systolic anterior motion of the mitral valve with mild mitral regurgitation, peak LVOT gradient of 21 mmHg.  Chest CT November 2024 showed 5 mm left lower lobe nodule consistent with benign lymph node and no follow-up necessary.  Since last seen, the patient has dyspnea with more extreme activities but not with routine activities. It is relieved with rest. It is not associated with chest pain. There is no orthopnea, PND or pedal edema. There is no syncope or palpitations. There is no exertional chest pain.   Current Outpatient Medications  Medication Sig Dispense Refill   azelastine  (ASTELIN ) 0.1 % nasal spray Place 2 sprays into both nostrils. Use in each nostril as directed     cetirizine (ZYRTEC) 10 MG tablet Take 10 mg by mouth daily.     fluticasone  (FLONASE ) 50 MCG/ACT nasal spray Place 2 sprays into both nostrils.     metoprolol  succinate (TOPROL -XL) 25 MG 24 hr tablet TAKE 1 TABLET BY MOUTH EVERY DAY 90 tablet 3   montelukast  (SINGULAIR ) 10 MG tablet Take 10 mg by mouth daily.     Multiple Vitamin (MULTIVITAMIN) tablet Take 1 tablet by mouth daily.     desloratadine (CLARINEX) 5 MG tablet  Take 5 mg by mouth daily. (Patient not taking: Reported on 06/14/2023)     RESVERATROL PO Take 1 tablet by mouth daily.     No current facility-administered medications for this visit.     Past Medical History:  Diagnosis Date   Allergy    Colon polyp    Mucosal prolapse polyp   Diverticulosis    Essential hypertension 09/22/2008   Qualifier: Diagnosis of  By: Gwenn Grimes     External hemorrhoids    HOCM (hypertrophic obstructive cardiomyopathy) (HCC)    Hypertension    Hypertrophic obstructive cardiomyopathy (HCC) 09/24/2008   Qualifier: Diagnosis of  By: Pietro, MD, CODY Redell Dimes    Irregular heart beat    VASOMOTOR RHINITIS 12/24/2008   Qualifier: Diagnosis of  By: Jame  MD, Maude FALCON    VENTRICULAR HYPERTROPHY, LEFT 09/22/2008   Qualifier: Diagnosis of  By: Gwenn Grimes      Past Surgical History:  Procedure Laterality Date   COLONOSCOPY     NASAL SEPTOPLASTY W/ TURBINOPLASTY N/A 03/19/2023   Procedure: SEPTOPLASTY, NOSE, WITH NASAL TURBINATE REDUCTION;  Surgeon: Karis Clunes, MD;  Location: [facility] OR;  Service: ENT;  Laterality: N/A;   TONSILLECTOMY      Social History   Socioeconomic History   Marital status: Married    Spouse name: Not on file   Number of children: 0   Years of education: Not on file   Highest education level: Bachelor's degree (e.g., BA, AB, BS)  Occupational History   Occupation: Building surveyor  Tobacco Use   Smoking  status: Former    Current packs/day: 0.00    Types: Cigarettes    Quit date: 05/06/1984    Years since quitting: 39.4   Smokeless tobacco: Never  Vaping Use   Vaping status: Never Used  Substance and Sexual Activity   Alcohol use: Not Currently    Comment: rarely   Drug use: No   Sexual activity: Not Currently  Other Topics Concern   Not on file  Social History Narrative   Not on file   Social Drivers of Health   Financial Resource Strain: Low Risk  (03/30/2023)   Overall Financial Resource Strain  (CARDIA)    Difficulty of Paying Living Expenses: Not hard at all  Food Insecurity: No Food Insecurity (03/30/2023)   Hunger Vital Sign    Worried About Running Out of Food in the Last Year: Never true    Ran Out of Food in the Last Year: Never true  Transportation Needs: No Transportation Needs (03/30/2023)   PRAPARE - Administrator, Civil Service (Medical): No    Lack of Transportation (Non-Medical): No  Physical Activity: Insufficiently Active (03/30/2023)   Exercise Vital Sign    Days of Exercise per Week: 4 days    Minutes of Exercise per Session: 30 min  Stress: No Stress Concern Present (03/30/2023)   [employer] of Occupational Health - Occupational Stress Questionnaire    Feeling of Stress : Not at all  Social Connections: Unknown (03/30/2023)   Social Connection and Isolation Panel    Frequency of Communication with Friends and Family: More than three times a week    Frequency of Social Gatherings with Friends and Family: More than three times a week    Attends Religious Services: Patient declined    Database administrator or Organizations: No    Attends Banker Meetings: Never    Marital Status: Married  Catering manager Violence: Not At Risk (03/30/2023)   Humiliation, Afraid, Rape, and Kick questionnaire    Fear of Current or Ex-Partner: No    Emotionally Abused: No    Physically Abused: No    Sexually Abused: No    Family History  Problem Relation Age of Onset   Pancreatic cancer Father    Colon polyps Father        Benign   Colon cancer Neg Hx    Diabetes Neg Hx    Kidney disease Neg Hx    Gallbladder disease Neg Hx    Esophageal cancer Neg Hx    Rectal cancer Neg Hx    Stomach cancer Neg Hx     ROS: no fevers or chills, productive cough, hemoptysis, dysphasia, odynophagia, melena, hematochezia, dysuria, hematuria, rash, seizure activity, orthopnea, PND, pedal edema, claudication. Remaining systems are negative.  Physical  Exam: Well-developed well-nourished in no acute distress.  Skin is warm and dry.  HEENT is normal.  Neck is supple.  Chest is clear to auscultation with normal expansion.  Cardiovascular exam is regular rate and rhythm.  2/6 systolic murmur that increases with Valsalva. Abdominal exam nontender or distended. No masses palpated. Extremities show no edema. neuro grossly intact  EKG Interpretation Date/Time:  Friday October 08 2023 08:19:02 EDT Ventricular Rate:  51 PR Interval:  184 QRS Duration:  114 QT Interval:  436 QTC Calculation: 401 R Axis:   -23  Text Interpretation: Sinus bradycardia Left ventricular hypertrophy with repolarization abnormality ( R in aVL , Sokolow-Lyon , Cornell product , Romhilt-Estes ) Cannot rule out  Septal infarct Confirmed by Pietro Rogue ([id number]) on 10/08/2023 8:19:49 AM    A/P  1 hypertrophic obstructive cardiomyopathy-continue beta-blocker.  Patient has no risk factors for sudden cardiac death.  Repeat echocardiogram.  He has no children and he has previously instructed his siblings to be screened for this.  2 hypertension-patient's blood pressure is controlled.  Continue present medications.    Rogue Pietro, MD

## 2023-10-08 ENCOUNTER — Encounter: Payer: Self-pay | Admitting: Cardiology

## 2023-10-08 ENCOUNTER — Ambulatory Visit: Attending: Cardiology | Admitting: Cardiology

## 2023-10-08 VITALS — BP 134/72 | HR 67 | Resp 14 | Ht 70.0 in | Wt 194.6 lb

## 2023-10-08 DIAGNOSIS — I421 Obstructive hypertrophic cardiomyopathy: Secondary | ICD-10-CM | POA: Insufficient documentation

## 2023-10-08 DIAGNOSIS — I10 Essential (primary) hypertension: Secondary | ICD-10-CM | POA: Diagnosis present

## 2023-10-08 NOTE — Patient Instructions (Signed)

## 2023-11-07 ENCOUNTER — Other Ambulatory Visit: Payer: Self-pay | Admitting: Cardiology

## 2023-11-18 ENCOUNTER — Ambulatory Visit (HOSPITAL_COMMUNITY)
Admission: RE | Admit: 2023-11-18 | Discharge: 2023-11-18 | Disposition: A | Discharge status: Home / Self Care | Source: Ambulatory Visit | Attending: Cardiovascular Disease | Admitting: Cardiovascular Disease

## 2023-11-18 ENCOUNTER — Ambulatory Visit: Payer: Self-pay | Admitting: Cardiology

## 2023-11-18 DIAGNOSIS — I421 Obstructive hypertrophic cardiomyopathy: Secondary | ICD-10-CM | POA: Insufficient documentation

## 2023-11-18 LAB — ECHOCARDIOGRAM COMPLETE
Est EF: >75
S' Lateral: 2.67 cm

## 2023-12-22 ENCOUNTER — Ambulatory Visit (INDEPENDENT_AMBULATORY_CARE_PROVIDER_SITE_OTHER): Admitting: Otolaryngology

## 2023-12-27 ENCOUNTER — Ambulatory Visit (INDEPENDENT_AMBULATORY_CARE_PROVIDER_SITE_OTHER): Admitting: Otolaryngology

## 2023-12-27 ENCOUNTER — Encounter (INDEPENDENT_AMBULATORY_CARE_PROVIDER_SITE_OTHER): Payer: Self-pay | Admitting: Otolaryngology

## 2023-12-27 VITALS — BP 130/84 | HR 85 | Ht 70.0 in | Wt 191.0 lb

## 2023-12-27 DIAGNOSIS — H9312 Tinnitus, left ear: Secondary | ICD-10-CM | POA: Insufficient documentation

## 2023-12-27 DIAGNOSIS — J343 Hypertrophy of nasal turbinates: Secondary | ICD-10-CM

## 2023-12-27 DIAGNOSIS — J310 Chronic rhinitis: Secondary | ICD-10-CM

## 2023-12-27 DIAGNOSIS — H9042 Sensorineural hearing loss, unilateral, left ear, with unrestricted hearing on the contralateral side: Secondary | ICD-10-CM

## 2023-12-27 DIAGNOSIS — H6122 Impacted cerumen, left ear: Secondary | ICD-10-CM | POA: Insufficient documentation

## 2023-12-27 NOTE — Progress Notes (Signed)
 Patient ID: George [name], male   DOB: 16-Aug-1950, 73 y.o.   MRN: [id number]  Follow up: Left ear hearing loss, chronic nasal congestion  History of Present Illness George [name] is a 73 year old male with a history of septoplasty and turbinate reduction who presents for follow-up of left ear hearing loss and chronic nasal congestion.  Six months after septoplasty and turbinate reduction, he reports significant improvement in nasal breathing, with both nasal passages remaining clear. He notes mild asymmetry, with one side feeling slightly more open, but this does not affect airflow. He continues to experience allergic symptoms, including sneezing and ocular pruritus, but these do not impact nasal patency. He uses Ayr saline spray and a saline rinse machine, particularly during allergy season and before bedtime, which help maintain nasal patency. He no longer experiences nocturnal nasal obstruction and is able to sleep through the night without requiring upright positioning.  He continues to experience left-sided tinnitus and hearing loss, which he perceives as more prominent, but there has been no progression of his underlying left ear hearing loss. He uses a hearing aid in the left ear for mid-range hearing loss, as recommended by audiology.  He does not report similar symptoms in the right ear.   Exam: General: Communicates without difficulty, well nourished, no acute distress. Head: Normocephalic, no evidence injury, no tenderness, facial buttresses intact without stepoff. Face/sinus: No tenderness to palpation and percussion. Facial movement is normal and symmetric. Eyes: PERRL, EOMI. No scleral icterus, conjunctivae clear. Neuro: CN II exam reveals vision grossly intact.  No nystagmus at any point of gaze. Ears: Auricles well formed without lesions.  Left ear cerumen impaction.  The right ear canal and tympanic membrane are normal.  Nose: External evaluation reveals normal support and skin without  lesions.  Dorsum is intact.  Anterior rhinoscopy reveals congested mucosa over anterior aspect of inferior turbinates and intact septum.  No purulence noted. Oral:  Oral cavity and oropharynx are intact, symmetric, without erythema or edema.  Mucosa is moist without lesions. Neck: Full range of motion without pain.  There is no significant lymphadenopathy.  No masses palpable.  Thyroid bed within normal limits to palpation.  Parotid glands and submandibular glands equal bilaterally without mass.  Trachea is midline. Neuro:  CN 2-12 grossly intact.    Procedure: Left ear cerumen disimpaction Anesthesia: None Description: Under the operating microscope, the cerumen is carefully removed with a combination of cerumen currette, alligator forceps, and suction catheters.  After the cerumen is removed, the TMs are noted to be normal.  No mass, erythema, or lesions. The patient tolerated the procedure well.   Assessment & Plan Left ear hearing loss and tinnitus, secondary to previous barotrauma Left ear hearing loss with persistent tinnitus, sequelae of prior barotrauma. Hearing aid provides partial correction for mid-range frequencies, but does not fully compensate. No acute changes. - Recommended continued use of prescribed hearing aid. - Advised annual audiologic follow-up to monitor hearing status.  Cerumen impaction, left ear -Otomicroscopy with left ear cerumen disimpaction. - Advised against inserting objects into the ear canal for cleaning. - Recommended annual visits for ear canal cleaning as needed.  Chronic rhinitis Mild chronic rhinitis with persistent allergy symptoms. Nasal breathing significantly improved post-septoplasty and turbinate reduction. Saline irrigation effectively controls symptoms. No acute sinusitis or nasal obstruction. - Recommended continued use of saline nasal irrigation as needed, particularly during allergy season. - Reinforced benefit of nasal hygiene for symptom  control. - Advised annual follow-up  to monitor nasal and sinus status.
